# Patient Record
Sex: Female | Born: 1935 | Race: White | Hispanic: No | Smoking: Former smoker
Health system: Southern US, Community
[De-identification: ages and names within clinical notes are randomized; demographics above are authoritative.]

## PROBLEM LIST (undated history)

## (undated) DIAGNOSIS — I4891 Unspecified atrial fibrillation: Principal | ICD-10-CM

## (undated) DIAGNOSIS — R0602 Shortness of breath: Secondary | ICD-10-CM

## (undated) DIAGNOSIS — G629 Polyneuropathy, unspecified: Secondary | ICD-10-CM

## (undated) DIAGNOSIS — D72829 Elevated white blood cell count, unspecified: Secondary | ICD-10-CM

## (undated) DIAGNOSIS — J449 Chronic obstructive pulmonary disease, unspecified: Secondary | ICD-10-CM

## (undated) DIAGNOSIS — E785 Hyperlipidemia, unspecified: Secondary | ICD-10-CM

## (undated) DIAGNOSIS — D649 Anemia, unspecified: Secondary | ICD-10-CM

## (undated) DIAGNOSIS — I10 Essential (primary) hypertension: Secondary | ICD-10-CM

## (undated) DIAGNOSIS — K31819 Angiodysplasia of stomach and duodenum without bleeding: Secondary | ICD-10-CM

## (undated) DIAGNOSIS — K219 Gastro-esophageal reflux disease without esophagitis: Secondary | ICD-10-CM

## (undated) DIAGNOSIS — E11610 Type 2 diabetes mellitus with diabetic neuropathic arthropathy: Secondary | ICD-10-CM

## (undated) DIAGNOSIS — R591 Generalized enlarged lymph nodes: Secondary | ICD-10-CM

## (undated) DIAGNOSIS — R300 Dysuria: Secondary | ICD-10-CM

## (undated) DIAGNOSIS — I890 Lymphedema, not elsewhere classified: Secondary | ICD-10-CM

## (undated) DIAGNOSIS — C50919 Malignant neoplasm of unspecified site of unspecified female breast: Secondary | ICD-10-CM

## (undated) DIAGNOSIS — E119 Type 2 diabetes mellitus without complications: Secondary | ICD-10-CM

## (undated) DIAGNOSIS — R5383 Other fatigue: Secondary | ICD-10-CM

## (undated) DIAGNOSIS — E559 Vitamin D deficiency, unspecified: Secondary | ICD-10-CM

## (undated) HISTORY — PX: TONSILLECTOMY: SUR1361

## (undated) HISTORY — DX: Anemia, unspecified: D64.9

## (undated) HISTORY — PX: LUNG REMOVAL, PARTIAL: SHX233

## (undated) HISTORY — PX: THYROIDECTOMY, PARTIAL: SHX18

## (undated) HISTORY — DX: Chronic obstructive pulmonary disease, unspecified: J44.9

## (undated) HISTORY — DX: Elevated white blood cell count, unspecified: D72.829

## (undated) HISTORY — PX: BREAST SURGERY: SHX581

## (undated) HISTORY — PX: MASTECTOMY: SHX3

---

## 1998-01-22 ENCOUNTER — Other Ambulatory Visit: Admission: RE | Admit: 1998-01-22 | Discharge: 1998-01-22 | Payer: Self-pay | Admitting: Obstetrics and Gynecology

## 1998-04-30 ENCOUNTER — Encounter (HOSPITAL_COMMUNITY): Admission: RE | Admit: 1998-04-30 | Discharge: 1998-07-29 | Payer: Self-pay | Admitting: Cardiology

## 1998-05-22 ENCOUNTER — Encounter: Payer: Self-pay | Admitting: Cardiology

## 1998-05-22 ENCOUNTER — Inpatient Hospital Stay (HOSPITAL_COMMUNITY): Admission: EM | Admit: 1998-05-22 | Discharge: 1998-05-24 | Payer: Self-pay | Admitting: Internal Medicine

## 1998-07-25 ENCOUNTER — Ambulatory Visit (HOSPITAL_COMMUNITY): Admission: RE | Admit: 1998-07-25 | Discharge: 1998-07-25 | Payer: Self-pay | Admitting: Cardiology

## 1998-07-25 ENCOUNTER — Encounter: Payer: Self-pay | Admitting: Cardiology

## 1999-03-04 ENCOUNTER — Other Ambulatory Visit: Admission: RE | Admit: 1999-03-04 | Discharge: 1999-03-04 | Payer: Self-pay | Admitting: Obstetrics and Gynecology

## 2000-06-29 ENCOUNTER — Other Ambulatory Visit: Admission: RE | Admit: 2000-06-29 | Discharge: 2000-06-29 | Payer: Self-pay | Admitting: Obstetrics and Gynecology

## 2000-08-26 ENCOUNTER — Encounter: Admission: RE | Admit: 2000-08-26 | Discharge: 2000-08-26 | Payer: Self-pay | Admitting: Cardiology

## 2000-08-26 ENCOUNTER — Encounter: Payer: Self-pay | Admitting: Cardiology

## 2001-06-17 ENCOUNTER — Emergency Department (HOSPITAL_COMMUNITY): Admission: EM | Admit: 2001-06-17 | Discharge: 2001-06-17 | Payer: Self-pay | Admitting: Emergency Medicine

## 2001-06-27 ENCOUNTER — Encounter (INDEPENDENT_AMBULATORY_CARE_PROVIDER_SITE_OTHER): Payer: Self-pay | Admitting: Specialist

## 2001-06-27 ENCOUNTER — Ambulatory Visit (HOSPITAL_COMMUNITY): Admission: RE | Admit: 2001-06-27 | Discharge: 2001-06-27 | Payer: Self-pay | Admitting: *Deleted

## 2001-09-06 ENCOUNTER — Ambulatory Visit (HOSPITAL_COMMUNITY): Admission: RE | Admit: 2001-09-06 | Discharge: 2001-09-06 | Payer: Self-pay | Admitting: Cardiology

## 2001-09-06 ENCOUNTER — Encounter: Payer: Self-pay | Admitting: Cardiology

## 2002-06-18 ENCOUNTER — Encounter: Payer: Self-pay | Admitting: Emergency Medicine

## 2002-06-18 ENCOUNTER — Emergency Department (HOSPITAL_COMMUNITY): Admission: EM | Admit: 2002-06-18 | Discharge: 2002-06-18 | Payer: Self-pay | Admitting: Emergency Medicine

## 2002-10-18 ENCOUNTER — Other Ambulatory Visit: Admission: RE | Admit: 2002-10-18 | Discharge: 2002-10-18 | Payer: Self-pay | Admitting: Obstetrics and Gynecology

## 2002-12-21 ENCOUNTER — Ambulatory Visit (HOSPITAL_COMMUNITY): Admission: RE | Admit: 2002-12-21 | Discharge: 2002-12-21 | Payer: Self-pay | Admitting: *Deleted

## 2003-03-09 ENCOUNTER — Inpatient Hospital Stay (HOSPITAL_COMMUNITY): Admission: RE | Admit: 2003-03-09 | Discharge: 2003-03-12 | Payer: Self-pay | Admitting: *Deleted

## 2003-03-11 ENCOUNTER — Encounter: Payer: Self-pay | Admitting: *Deleted

## 2004-04-19 ENCOUNTER — Inpatient Hospital Stay (HOSPITAL_COMMUNITY): Admission: EM | Admit: 2004-04-19 | Discharge: 2004-04-22 | Payer: Self-pay | Admitting: *Deleted

## 2004-04-19 ENCOUNTER — Ambulatory Visit: Payer: Self-pay | Admitting: Internal Medicine

## 2004-04-21 ENCOUNTER — Encounter (INDEPENDENT_AMBULATORY_CARE_PROVIDER_SITE_OTHER): Payer: Self-pay | Admitting: Specialist

## 2004-05-13 ENCOUNTER — Encounter (HOSPITAL_COMMUNITY): Admission: RE | Admit: 2004-05-13 | Discharge: 2004-06-09 | Payer: Self-pay | Admitting: Cardiology

## 2004-11-06 ENCOUNTER — Other Ambulatory Visit: Admission: RE | Admit: 2004-11-06 | Discharge: 2004-11-06 | Payer: Self-pay | Admitting: Obstetrics and Gynecology

## 2004-11-24 ENCOUNTER — Ambulatory Visit (HOSPITAL_COMMUNITY): Admission: RE | Admit: 2004-11-24 | Discharge: 2004-11-24 | Payer: Self-pay | Admitting: Cardiology

## 2007-04-04 ENCOUNTER — Inpatient Hospital Stay (HOSPITAL_COMMUNITY): Admission: EM | Admit: 2007-04-04 | Discharge: 2007-04-07 | Payer: Self-pay | Admitting: Emergency Medicine

## 2007-05-30 ENCOUNTER — Inpatient Hospital Stay (HOSPITAL_COMMUNITY): Admission: EM | Admit: 2007-05-30 | Discharge: 2007-06-03 | Payer: Self-pay | Admitting: Emergency Medicine

## 2008-03-28 ENCOUNTER — Inpatient Hospital Stay (HOSPITAL_COMMUNITY): Admission: AD | Admit: 2008-03-28 | Discharge: 2008-04-06 | Payer: Self-pay | Admitting: Internal Medicine

## 2008-03-29 ENCOUNTER — Ambulatory Visit: Payer: Self-pay | Admitting: Surgery

## 2008-03-30 ENCOUNTER — Encounter (INDEPENDENT_AMBULATORY_CARE_PROVIDER_SITE_OTHER): Payer: Self-pay | Admitting: Internal Medicine

## 2009-08-16 ENCOUNTER — Inpatient Hospital Stay (HOSPITAL_COMMUNITY): Admission: EM | Admit: 2009-08-16 | Discharge: 2009-08-22 | Payer: Self-pay | Admitting: Emergency Medicine

## 2010-08-24 LAB — CK TOTAL AND CKMB (NOT AT ARMC)
CK, MB: 2.3 ng/mL (ref 0.3–4.0)
Relative Index: INVALID (ref 0.0–2.5)
Total CK: 43 U/L (ref 7–177)

## 2010-08-24 LAB — GLUCOSE, CAPILLARY
Glucose-Capillary: 100 mg/dL — ABNORMAL HIGH (ref 70–99)
Glucose-Capillary: 102 mg/dL — ABNORMAL HIGH (ref 70–99)
Glucose-Capillary: 107 mg/dL — ABNORMAL HIGH (ref 70–99)
Glucose-Capillary: 109 mg/dL — ABNORMAL HIGH (ref 70–99)
Glucose-Capillary: 118 mg/dL — ABNORMAL HIGH (ref 70–99)
Glucose-Capillary: 130 mg/dL — ABNORMAL HIGH (ref 70–99)
Glucose-Capillary: 150 mg/dL — ABNORMAL HIGH (ref 70–99)
Glucose-Capillary: 153 mg/dL — ABNORMAL HIGH (ref 70–99)
Glucose-Capillary: 166 mg/dL — ABNORMAL HIGH (ref 70–99)
Glucose-Capillary: 180 mg/dL — ABNORMAL HIGH (ref 70–99)
Glucose-Capillary: 188 mg/dL — ABNORMAL HIGH (ref 70–99)
Glucose-Capillary: 193 mg/dL — ABNORMAL HIGH (ref 70–99)
Glucose-Capillary: 211 mg/dL — ABNORMAL HIGH (ref 70–99)
Glucose-Capillary: 218 mg/dL — ABNORMAL HIGH (ref 70–99)
Glucose-Capillary: 223 mg/dL — ABNORMAL HIGH (ref 70–99)
Glucose-Capillary: 246 mg/dL — ABNORMAL HIGH (ref 70–99)
Glucose-Capillary: 261 mg/dL — ABNORMAL HIGH (ref 70–99)
Glucose-Capillary: 263 mg/dL — ABNORMAL HIGH (ref 70–99)
Glucose-Capillary: 317 mg/dL — ABNORMAL HIGH (ref 70–99)
Glucose-Capillary: 370 mg/dL — ABNORMAL HIGH (ref 70–99)
Glucose-Capillary: 398 mg/dL — ABNORMAL HIGH (ref 70–99)
Glucose-Capillary: 43 mg/dL — CL (ref 70–99)
Glucose-Capillary: 457 mg/dL — ABNORMAL HIGH (ref 70–99)
Glucose-Capillary: 96 mg/dL (ref 70–99)

## 2010-08-24 LAB — DIFFERENTIAL
Basophils Absolute: 0 K/uL (ref 0.0–0.1)
Basophils Relative: 0 % (ref 0–1)
Eosinophils Absolute: 0 K/uL (ref 0.0–0.7)
Eosinophils Relative: 0 % (ref 0–5)
Lymphocytes Relative: 12 % (ref 12–46)
Lymphs Abs: 1.5 K/uL (ref 0.7–4.0)
Monocytes Absolute: 0.5 K/uL (ref 0.1–1.0)
Monocytes Relative: 4 % (ref 3–12)
Neutro Abs: 10.3 K/uL — ABNORMAL HIGH (ref 1.7–7.7)
Neutrophils Relative %: 84 % — ABNORMAL HIGH (ref 43–77)

## 2010-08-24 LAB — BASIC METABOLIC PANEL
BUN: 38 mg/dL — ABNORMAL HIGH (ref 6–23)
BUN: 49 mg/dL — ABNORMAL HIGH (ref 6–23)
CO2: 23 mEq/L (ref 19–32)
CO2: 24 mEq/L (ref 19–32)
Calcium: 8.6 mg/dL (ref 8.4–10.5)
Calcium: 8.8 mg/dL (ref 8.4–10.5)
Chloride: 110 mEq/L (ref 96–112)
Creatinine, Ser: 1.27 mg/dL — ABNORMAL HIGH (ref 0.4–1.2)
Creatinine, Ser: 1.44 mg/dL — ABNORMAL HIGH (ref 0.4–1.2)
GFR calc Af Amer: 48 mL/min — ABNORMAL LOW (ref >60)
GFR calc Af Amer: 50 mL/min — ABNORMAL LOW (ref >60)
GFR calc non Af Amer: 34 mL/min — ABNORMAL LOW (ref >60)
GFR calc non Af Amer: 40 mL/min — ABNORMAL LOW (ref >60)
GFR calc non Af Amer: 42 mL/min — ABNORMAL LOW (ref >60)
Glucose, Bld: 114 mg/dL — ABNORMAL HIGH (ref 70–99)
Potassium: 4.2 mEq/L (ref 3.5–5.1)
Sodium: 136 mEq/L (ref 135–145)
Sodium: 142 mEq/L (ref 135–145)

## 2010-08-24 LAB — URINALYSIS, ROUTINE W REFLEX MICROSCOPIC
Bilirubin Urine: NEGATIVE
Ketones, ur: NEGATIVE mg/dL
Nitrite: NEGATIVE
Specific Gravity, Urine: 1.021 (ref 1.005–1.030)
Urobilinogen, UA: 0.2 mg/dL (ref 0.0–1.0)

## 2010-08-24 LAB — CBC
HCT: 32.2 % — ABNORMAL LOW (ref 36.0–46.0)
HCT: 35 % — ABNORMAL LOW (ref 36.0–46.0)
Hemoglobin: 11 g/dL — ABNORMAL LOW (ref 12.0–15.0)
Hemoglobin: 11.1 g/dL — ABNORMAL LOW (ref 12.0–15.0)
Hemoglobin: 11.4 g/dL — ABNORMAL LOW (ref 12.0–15.0)
Hemoglobin: 12 g/dL (ref 12.0–15.0)
MCHC: 34.1 g/dL (ref 30.0–36.0)
MCHC: 34.1 g/dL (ref 30.0–36.0)
MCHC: 34.2 g/dL (ref 30.0–36.0)
MCHC: 34.3 g/dL (ref 30.0–36.0)
MCV: 89.6 fL (ref 78.0–100.0)
MCV: 90 fL (ref 78.0–100.0)
MCV: 90.3 fL (ref 78.0–100.0)
MCV: 90.5 fL (ref 78.0–100.0)
Platelets: 235 10*3/uL (ref 150–400)
Platelets: 254 10*3/uL (ref 150–400)
Platelets: 254 K/uL (ref 150–400)
Platelets: 259 10*3/uL (ref 150–400)
RBC: 3.57 MIL/uL — ABNORMAL LOW (ref 3.87–5.11)
RBC: 3.63 MIL/uL — ABNORMAL LOW (ref 3.87–5.11)
RBC: 3.71 MIL/uL — ABNORMAL LOW (ref 3.87–5.11)
RBC: 3.87 MIL/uL (ref 3.87–5.11)
RDW: 13.2 % (ref 11.5–15.5)
RDW: 13.2 % (ref 11.5–15.5)
RDW: 13.5 % (ref 11.5–15.5)
WBC: 11.6 10*3/uL — ABNORMAL HIGH (ref 4.0–10.5)
WBC: 12.3 10*3/uL — ABNORMAL HIGH (ref 4.0–10.5)
WBC: 20.5 10*3/uL — ABNORMAL HIGH (ref 4.0–10.5)
WBC: 26.8 K/uL — ABNORMAL HIGH (ref 4.0–10.5)

## 2010-08-24 LAB — BASIC METABOLIC PANEL WITH GFR
BUN: 17 mg/dL (ref 6–23)
BUN: 40 mg/dL — ABNORMAL HIGH (ref 6–23)
BUN: 46 mg/dL — ABNORMAL HIGH (ref 6–23)
CO2: 23 meq/L (ref 19–32)
CO2: 24 meq/L (ref 19–32)
Calcium: 8.9 mg/dL (ref 8.4–10.5)
Calcium: 9.2 mg/dL (ref 8.4–10.5)
Chloride: 107 meq/L (ref 96–112)
Chloride: 108 meq/L (ref 96–112)
Chloride: 110 meq/L (ref 96–112)
Creatinine, Ser: 1.22 mg/dL — ABNORMAL HIGH (ref 0.4–1.2)
Creatinine, Ser: 1.27 mg/dL — ABNORMAL HIGH (ref 0.4–1.2)
Creatinine, Ser: 1.38 mg/dL — ABNORMAL HIGH (ref 0.4–1.2)
GFR calc non Af Amer: 36 mL/min — ABNORMAL LOW
GFR calc non Af Amer: 40 mL/min — ABNORMAL LOW
Glucose, Bld: 111 mg/dL — ABNORMAL HIGH (ref 70–99)
Glucose, Bld: 319 mg/dL — ABNORMAL HIGH (ref 70–99)
Glucose, Bld: 338 mg/dL — ABNORMAL HIGH (ref 70–99)
Potassium: 4.8 meq/L (ref 3.5–5.1)
Potassium: 4.8 meq/L (ref 3.5–5.1)
Sodium: 137 meq/L (ref 135–145)
Sodium: 141 meq/L (ref 135–145)

## 2010-08-24 LAB — CARDIAC PANEL(CRET KIN+CKTOT+MB+TROPI)
CK, MB: 2.9 ng/mL (ref 0.3–4.0)
CK, MB: 3.1 ng/mL (ref 0.3–4.0)
Relative Index: INVALID (ref 0.0–2.5)
Relative Index: INVALID (ref 0.0–2.5)
Total CK: 48 U/L (ref 7–177)
Total CK: 54 U/L (ref 7–177)

## 2010-08-24 LAB — TSH: TSH: 0.235 u[IU]/mL — ABNORMAL LOW (ref 0.350–4.500)

## 2010-08-24 LAB — POCT CARDIAC MARKERS
CKMB, poc: 1.5 ng/mL (ref 1.0–8.0)
Myoglobin, poc: 117 ng/mL (ref 12–200)
Troponin i, poc: <0.05 ng/mL (ref 0.00–0.09)

## 2010-08-24 LAB — URINE MICROSCOPIC-ADD ON

## 2010-08-24 LAB — TROPONIN I: Troponin I: 0.01 ng/mL (ref 0.00–0.06)

## 2010-08-24 LAB — HEMOGLOBIN A1C
Hgb A1c MFr Bld: 8.3 % — ABNORMAL HIGH (ref 4.6–6.1)
Mean Plasma Glucose: 192 mg/dL

## 2010-08-24 LAB — BRAIN NATRIURETIC PEPTIDE: Pro B Natriuretic peptide (BNP): 74 pg/mL (ref 0.0–100.0)

## 2010-10-14 NOTE — H&P (Signed)
NAME:  Lori [name], Lori [name] NO.:  [id number]   MEDICAL RECORD NO.:  [id number]          PATIENT TYPE:  INP   LOCATION:  5509                         FACILITY:  [facility]   PHYSICIAN:  Lucita Ferrara, MD         DATE OF BIRTH:  Jul 04, 1920   DATE OF ADMISSION:  04/04/2007  DATE OF DISCHARGE:                              HISTORY & PHYSICAL   HISTORY OF PRESENT ILLNESS:  The patient is an 78 year old female comes  in with black tarry stools that has been going on for several days, 4 or  5 days, getting progressively worse.  The patient denies any massive  bright red blood per rectum.  Denies any vomiting, hematemesis.  Denies  hemorrhoids.  Denies diarrhea or focal abdominal pain, Denies nausea or  vomiting.  She also denies any chest pain, shortness of breath.  Positive dizziness.  No falls.  Denies excessive use of NSAIDS.   REVIEW OF SYSTEMS:  Otherwise, review of systems is negative.  No focal  neurological deficits.   PAST MEDICAL HISTORY:  Significant for:  1. Recurrent GI bleed status post EGD April 21, 2004, status post      epinephrine injection and biopsy.  2. Type 2 diabetes.  3. Dyslipidemia.  4. Hypertension.  5. History of paroxysmal atrial fibrillation.  6. History of hypothyroidism, history of being on Synthroid.   ALLERGIES:  1. PENICILLIN.  2. SULFA.   MEDICATIONS:  1. Benicar.  2. Glyburide.  Does not know doses.   PAST SURGICAL HISTORY:  None.   PHYSICAL EXAMINATION:  VITAL SIGNS:  T-max 97.8.  Blood pressure 105/54.  Pulse 97 to 117.  Pulse ox 100% on room air.  GENERAL:  The patient is in no acute distress.  HEENT:  Normocephalic, atraumatic.  Sclerae anicteric.  No JVD.  No  carotid bruits.  NECK:  Supple.  CARDIOVASCULAR:  S1, S2.  Regular rate and rhythm.  No murmurs, rubs or  clicks.  ABDOMEN:  Soft, nontender, nondistended.  RECTAL:  Good tone.  Stool for guaiac positive.  Stool was black.  LUNGS:  Clear to auscultation bilaterally.   No rhonchi, rales or  wheezes.  EXTREMITIES:  No clubbing, cyanosis or edema.  NEURO:  Grossly intact.  Cranial nerves II-XII grossly intact.   LABORATORY DATA:  A complete metabolic panel:  Sodium 136.  Potassium  5.5.  Chloride 104.  CO2 24.  BUN 96.  Creatinine 1.34.  Glucose 416.  CBC:  White count 13.3.  Hemoglobin 7.1.  Hematocrit 20.5.  Platelets  230.  MCV 86.7.  No radiological studies were done here in ED.   ASSESSMENT/PLAN:  An 77 year old with:  1. Drop in hemoglobin, most likely secondary to upper GI bleed.  We      consulted Gastroenterology for a possible upper endoscopy versus      total endoscopy.  Dr. Laural Benes, from [facility], consulted.  We will      go ahead and transfuse 2 units of packed red blood cells and      recheck an H/H every 508  hours, IV fluids, strict I's and O's.  2. Diabetes type 2, uncontrolled.  We will hold oral medications for      now.  Sliding scale insulin with Lantus at night.  Hemoglobin A1c      to check control.  3. Leukocytosis.  Get a chest x-ray PA and lateral.  Pan culture, UA      urine culture, sputum culture and blood culture.  Empiric      antibiotics with Levaquin 500 mg IV daily.  4. Hypertension.  Continue Benicar, other medications.  Consider beta      blockers.  5. History of paroxysmal AFib with current tachycardia.  Normal sinus      rhythm.  It is sinus tach.  We will go ahead and start metoprolol      12.5 mg p.o. b.i.d.  We will get an EKG and cardiac enzymes x3.  We      will also get a thyroid function test given history of      hypothyroidism.  I have explained the plans of procedures of this      admission to the patient.  The patient understands well.      Lucita Ferrara, MD  Electronically Signed     RR/MEDQ  D:  04/04/2007  T:  04/04/2007  Job:  [id number]

## 2010-10-14 NOTE — Discharge Summary (Signed)
Lori [name], Lori [name]                 ACCOUNT NO.:  [id number]   MEDICAL RECORD NO.:  [id number]          PATIENT TYPE:  INP   LOCATION:  4740                         FACILITY:  [facility]   PHYSICIAN:  Wilson Singer, M.D.DATE OF BIRTH:  20-Nov-1920   DATE OF ADMISSION:  05/30/2007  DATE OF DISCHARGE:  06/03/2007                               DISCHARGE SUMMARY   FINAL DISCHARGE DIAGNOSES:  1. Gastrointestinal bleed, unclear etiology.  2. Diabetes mellitus type 2.  3. History of right lung cancer status post surgical resection 1995.      No further recurrence.  4. Status post multiple endoscopy.  5. Hypertension.   CONDITION ON DISCHARGE:  Stable.   MEDICATIONS ON DISCHARGE:  Glyburide 10 mg b.i.d. and Benicar 20 mg  daily.   HISTORY:  This very pleasant 77 year old lady was admitted with  dizziness, weakness and melena for 2 weeks.  Please see initial history  and physical examination by Dr. Brien Few.   HOSPITAL COURSE:  The patient was admitted and on admission her  hemoglobin was 7.2.  She had to have blood transfusion of 2 units.  She  then further had another 1 unit of transfusion to make total of 3 units  total.  She was seen by her gastroenterologist, Dr. Virginia Rochester, who did an  upper GI endoscopy which showed no active bleeding at the time but the  presumption was that she had AV malformations causing the bleeding which  had now discontinued.  He has ordered a video capsule endoscopy but she  has failed to pass the capsule at the present time.  She also underwent  a CT scan of the abdomen which was unremarkable except for possible  abnormality of the left renal gland.  This should be further evaluated  as per recommendation by radiology as an outpatient by MR scanning.  It  was not felt appropriate to do this as an inpatient as she was now  clinically stable.   PHYSICAL EXAMINATION:  On physical examination today there is no further  GI bleeding.  On examination, temperature 97.8,  blood pressure 122/69,  pulse 89, saturation 99% on room air.  There were no new physical  findings today.   INVESTIGATIONS:  Today show a sodium of 136, potassium 4.1, bicarbonate  25, BUN 19, creatinine 1.12, hemoglobin 11.6, white blood cell count  9.6, platelets 339.   FOLLOWUP AND DISPOSITION:  She is stable to be discharged home and will  follow up with Dr. Virginia Rochester within the next week or so.  During this  hospitalization her antihypertensive medication was discontinued and her  blood pressure maintained at a reasonable level.  She may start her ACE  inhibitor as an outpatient but her blood pressure will need to be  closely monitored to make sure it does not become hypotensive.      Wilson Singer, M.D.  Electronically Signed     NCG/MEDQ  D:  06/03/2007  T:  06/03/2007  Job:  [id number]   cc:   Jaclyn Prime. Lucas Mallow, M.D.  Georgiana Spinner, M.D.

## 2010-10-14 NOTE — Discharge Summary (Signed)
NAMECHANDY, Lori [name]                 ACCOUNT NO.:  [id number]   MEDICAL RECORD NO.:  [id number]          PATIENT TYPE:  INP   LOCATION:  5501                         FACILITY:  [facility]   PHYSICIAN:  Ladell Pier, M.D.   DATE OF BIRTH:  05/10/1921   DATE OF ADMISSION:  04/04/2007  DATE OF DISCHARGE:  04/07/2007                               DISCHARGE SUMMARY   DISCHARGE DIAGNOSIS:  1. Recurrent gastrointestinal bleed status post      esophagogastroduodenoscopy April 21, 2004 status post      epinephrine injection and biopsy then.  2. Diabetes.  3. Dyslipidemia.  4. Hypertension.  5. History of paroxysmal atrial fibrillation.  6. History of hypothyroidism doing well on Synthroid.  7. Mild leukocytosis.   DISCHARGE MEDICATIONS:  1. Benicar.  2. Glyburide.  3. Ambien every evening p.r.n.   PROCEDURES:  She had upper and lower endoscopy done per Dr. Kinnie Scales on  April 06, 2007.  Upper endoscopy showed distal esophagus ulcer without  active hemorrhage, erosive duodenitis at the bulb, but no hemorrhage.  Colonoscopy showed no lesions to explain bleed, diverticulosis, AVMs  small nonbleeding, no neoplasm.   CONSULTANTS:  Griffith Citron, M.D.   FOLLOW-UP APPOINTMENTS:  The patient is to call Dr. Jennye Boroughs office and  make scheduled follow-up appointment.  Question of scheduled for a  capsule endoscopy.  The patient also to follow up with primary care  doctor, Dr. Lucas Mallow.   INSTRUCTIONS:  The patient was instructed not to use any aspirin  products or any NSAID.  The patient is to have followup chest x-ray to  followup on x-ray done in the hospital as described below.   HISTORY OF PRESENT ILLNESS:  The patient is an 77 year old female that  comes in with black tarry stool that has been going on for several days  that has been getting progressively worse.  She did not have any bright  red blood per rectum.  No hematemesis.  No hemorrhoids.  No diarrhea or  abdominal pain.  She  has no shortness of breath.  No dizziness.  No  excessive NSAID use.  Past medical history, family history, social  history, meds, allergies, review of systems per admission H&P.   PHYSICAL EXAMINATION ON DISCHARGE:  VITAL SIGNS:  Temperature 98, pulse  of 88, respirations 20, blood pressure 122/69, pulse ox 99% on room air.  CBG 87-131.  HEENT:  Head is normocephalic, atraumatic.  Pupils reactive to light.  Throat without erythema.  CARDIOVASCULAR:  Irregular.  LUNGS:  Clear bilaterally.  ABDOMEN:  Positive bowel sounds, nontender, nondistended.  EXTREMITIES:  Without edema.   HOSPITAL COURSE:  1. GI bleed:  The patient was admitted to the hospital.  She was      transfused several units of blood.  GI was consulted.  She had both      an upper and a lower endoscopy done that did not show any active      bleeding.  She will follow up with Dr. Kinnie Scales and with Dr. Virginia Rochester      outpatient.  On discharge, hemoglobin  was greater than 11 and      stable.  She was instructed not to take any aspirin products or use      any NSAIDs.  2. Diabetes:  Her blood sugar remained stable throughout her      hospitalization.  Last blood sugar prior to discharge was 87.  3. Hypothyroidism:  She is not on medications for thyroid disorder.      She was previously, but her TSH is normal at 1.894.  4. Anemia, blood loss anemia:  Hemoglobin was stable on discharge.  5. Mild leukocytosis:  White count was slightly elevated.  This could      be a stress reaction.  Patient to followup for CBC outpatient.   DISCHARGE LABORATORY DATA:  TSH 1.894.  Urine culture was negative.  Sodium 140, potassium 3.8, chloride 109, CO2 25, glucose 81, BUN 24,  creatinine 0.89, calcium 8.4.  WBC 11.8, hemoglobin 11.4, platelet 194.  Blood cultures negative x2.  Cardiac enzymes normal.  Hemoglobin A1c  6.5.  Chest x-ray showed focal density at the apex probably related to  scarring although a small area of infection or inflammatory  alveolitis  could have this appearance.  Follow up to ensure stability/resolution is  recommended.      Ladell Pier, M.D.  Electronically Signed     NJ/MEDQ  D:  04/07/2007  T:  04/07/2007  Job:  [id number]   cc:   Griffith Citron, M.D.  Dr. Lucas Mallow

## 2010-10-14 NOTE — H&P (Signed)
Lori [name], BILLS NO.:  [id number]   MEDICAL RECORD NO.:  [id number]          PATIENT TYPE:  INP   LOCATION:  2918                         FACILITY:  [facility]   PHYSICIAN:  Isidor Holts, M.D.  DATE OF BIRTH:  02/02/1921   DATE OF ADMISSION:  05/30/2007  DATE OF DISCHARGE:                              HISTORY & PHYSICAL   PRIMARY MEDICAL DOCTOR:  Dr. Lucas Mallow.   PRIMARY GASTROENTEROLOGIST:  Dr. Sabino Gasser.   CHIEF COMPLAINT:  Dizziness and weakness, worse today.  Also melena for  the past 2 weeks.   HISTORY OF PRESENT ILLNESS:  This is an 77 year old female. For past  medical history, see below.  The patient has known history of recurrent  GI bleed and anemia and is status post multiple unrevealing endoscopies  and multiple blood transfusions.  Her last admission was to [facility] from November 3 to April 07, 2007, for similar symptoms.  She  was transfused appropriately at that time, and underwent upper and lower  GI endoscopy.  Capsule endoscopy was attempted.  However, this was not  completed for technical reasons.  Hemoglobin on discharge was 11.4.  According to the patient, she felt quite well thereafter.  However,  since approximately 2 weeks ago, she started having recurrent episodes  of black stools and has experienced progressive weakness.  She is no  more short of breath than usual.  Denies abdominal pain.  Denies  hematemesis, and had experienced no ankle swelling or chest pain.  She  also emphatically denies NSAIDS use.  Today on May 30, 2007, while  she was shopping at [employer], at about 1:00 p.m., she felt so weak  and dizzy that she had to sit on the floor.  EMS was called by  bystanders, but the patient declined ED visit at that time.  Her son,  however, took her to her primary MD's office, who then referred her to  the emergency department.   PAST MEDICAL HISTORY:  1. Recurrent GI bleed, query source.  2. Status post  multiple endoscopies.  3. Type 2 diabetes mellitus/neuropathy.  4. Hypertension.  5. Dyslipidemia.  6. Paroxysmal atrial fibrillation.  7. Dysthyroidism.  8. Status post thyroidectomy.  9. History of right lung cancer status post surgical resection 1995.      Did not have chemotherapy or radiation therapy.   MEDICATION HISTORY.:  1. Benicar 20 mg p.o. daily.  2. Glyburide 10 mg p.o. b.i.d.  3. Prednisone 5 mg p.o. every other day.   ALLERGIES:  PENICILLIN AND SULFA.   REVIEW OF SYSTEMS:  As per HPI and chief complaint otherwise negative.   SOCIAL HISTORY:  The patient is widowed, has two adult female children,  remote history of smoking, quit 1995.  No alcohol use.  She was employed  as an Film/video editor prior to retirement.   FAMILY HISTORY:  Noncontributory.   PHYSICAL EXAMINATION:  VITALS:  Temperature 97.0, pulse 94 per minute  regular, respiratory 24, BP 124/77 mmHg, pulse oximeter 99% on room air.  The patient does not appear to be  in obvious acute distress at time of  this evaluation.  However, she appears mildly short of breath on minimal  exertion.  HEENT:.  Marked clinical pallor, no jaundice.  No conjunctival  injection.  Throat is clear.  NECK:  Supple.  JVP not seen.  No palpable lymphadenopathy.  No palpable  goiter.  CHEST:  Clinically clear to auscultation.  No wheezes or crackles.  HEART:  Sounds 1 and 2 heard, normal, regular, no murmurs.  ABDOMEN:  Full, soft and nontender.  No palpable organomegaly or  palpable masses.  Normal bowel sounds.  LOWER EXTREMITY EXAMINATION:  No pitting edema.  Palpable peripheral  pulses.  MUSCULOSKELETAL SYSTEM:  Osteoarthritic changes.  CENTRAL NERVOUS SYSTEM:  No focal neurologic deficit on gross  examination.   INVESTIGATIONS:  CBC:  WBC 12.5, hemoglobin 7.2, hematocrit 22.3.  MCV  89.9, platelets 366, INR 0.9, PTT 30 seconds.  Electrolytes:  Sodium  132, potassium 4.7, chloride 102, CO2 22, BUN 43, creatinine 1.26,   glucose 270, AST 30.   ASSESSMENT AND PLAN:  1. GI bleed.  Clinically, this appears to be upper GI bleed.  However,      the patient has had multiple previous endoscopies for similar      symptoms with no culprit source found.  The patient is symptomatic      however, we shall admit to the step-down unit for close monitoring,      institute intravenous fluids, do serial hemoglobin/hematocrit and      consult gastroenterology. As capsule endoscopy was not feasible      during the last admission secondary to technical reasons, perhaps      it can be done during the course of this hospitalization.   1. Acute on chronic blood loss anemia.  We shall do hematinic studies      and transfuse packed red blood cells p.r.n.   1. Type 2 diabetes mellitus.  The patient to be n.p.o. for now.      However, we shall institute sliding scale insulin coverage.   1. History of hypertension.  The patient is normotensive at present,      and in view of orthostatic symptoms, we shall hold antihypertensive      medications and monitor.   1. Presyncope secondary to #s 1 and 2 above.  This should resolve      following appropriate management as outlined above.   1. Possible restless leg syndrome.  The patient states that she has a      diabetic neuropathy.  However, on questioning, it appears that she      experiences pains in both lower extremities when she lies down to      sleep but      these disappear when she gets up and starts walking around.  These      symptoms appeared to be consistent with restless leg syndrome.      Perhaps the patient will benefit from a trial of Requip when      clinically stable.   Further management will depend on clinical course.      Isidor Holts, M.D.  Electronically Signed     CO/MEDQ  D:  05/30/2007  T:  05/31/2007  Job:  [id number]   cc:   Jaclyn Prime. Lucas Mallow, M.D.  Georgiana Spinner, M.D.

## 2010-10-14 NOTE — Consult Note (Signed)
NAMEALTAMESE, Lori [name] NO.:  [id number]   MEDICAL RECORD NO.:  [id number]          PATIENT TYPE:  INP   LOCATION:  5529                         FACILITY:  [facility]   PHYSICIAN:  Leonides Grills, M.D.     DATE OF BIRTH:  11/28/1920   DATE OF CONSULTATION:  04/04/2008  DATE OF DISCHARGE:                                 CONSULTATION   CHIEF COMPLAINT:  Right foot redness with deformity over the last  several weeks.   HISTORY:  This 77 year old female for over the last several weeks has  developed progressive redness and deformity to her right foot.  She was  admitted to medicine for cellulitis and was seen by Dr. Ranell Patrick who  checked her films and MRI and found that she had a Charcot arthropathy  with overlying cellulitis and had an impending ulcer.  She then referred  to me for further evaluation and treatment.   PAST MEDICAL HISTORY:  She has diabetes mellitus with peripheral  neuropathy, hypertension, AFib, and anemia.  She is on 8 days of  vancomycin.  She does not smoke.   PHYSICAL EXAMINATION:  She has erythema over the entire dorsal aspect of  her right foot and ankle.  She has wrinkles in her skin.  She has a  valgus, slight extension deformity to her right foot and she has an  impending ulcer over the medial cuneiform on the medial aspect of the  right foot.  She has exquisitely tight gastroc.  She has had decreased  sensation of her entire foot and ankle, palpable pulses.  The foot is  warm.  X-rays and MRI were evaluated show a homolateral and dorsolateral  dislocation through the TMT joint consistent with Charcot arthropathy.  No abscess.   IMPRESSION:  Cellulitis right foot with underlying Charcot arthropathy  with homolateral valgus extension dislocation and an impending ulcer.  Plan is explained to Ms. Gebhard at great length today that due to the  fact that she has an overlying cellulitis that an extensive  reconstruction of her forefoot would likely lead  to not only wound  problems but a deep infection and likely an amputation.  Because of this  in the face of her having impending ulcer, I would recommend that we do  an excision of the medial and probable the middle cuneiforms and the  gastroc slide.  We aware of the risks of the surgery, which include  infection or vessel injury, progression of the Charcot arthropathy and  instability, possibility of further impending ulcer, deep infection,  wound complications, and possibly even a proximalamputations, included  bony amputation, were  all explained.  Questions were encouraged and  answered.  I discussed this with the patient as well as her son.      Leonides Grills, M.D.  Electronically Signed     PB/MEDQ  D:  04/04/2008  T:  04/05/2008  Job:  [id number]

## 2010-10-14 NOTE — H&P (Signed)
NAMEJAYME, MEDNICK NO.:  [id number]   MEDICAL RECORD NO.:  [id number]          PATIENT TYPE:  INP   LOCATION:  5529                         FACILITY:  [facility]   PHYSICIAN:  Massie Maroon, MD        DATE OF BIRTH:  1920-12-16   DATE OF ADMISSION:  03/28/2008  DATE OF DISCHARGE:                              HISTORY & PHYSICAL   CHIEF COMPLAINT:  My leg is red.   HISTORY OF PRESENT ILLNESS:  An 77 year old female with complaints of  redness of her right distal lower extremity for 2-3 weeks.  She was  treated with Avelox initially and received 5 days of Avelox and then was  seen by Dr. Arminda Resides on Monday.  He switched the antibiotic to  doxycycline to treat her for cellulitis and there was no improvement, so  today she was sent over for admission for IV antibiotics.  The patient  denies any fever, chills, or pain in her feet unless she is walking.   PAST MEDICAL HISTORY:  1. Diabetes with neuropathy.  2. Hypertension.  3. Paroxysmal atrial fibrillation (not on Coumadin due to history of      GI bleeding, recurrent).  4. GERD.  5. 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  6. Hypothyroidism.  7. Left adrenal hyperplasia on CT scan August 26, 2000.   PAST SURGICAL HISTORY:  1. Lung cancer status post resection right lower lung, 1995.  2. Partial thyroidectomy, 1982.  3. T&A.  4. Colonoscopy, June 27, 2001, which showed internal hemorrhoids.   SOCIAL HISTORY:  The patient does not smoke or drink.  She quit smoking  16 years ago.   FAMILY HISTORY:  Her father had a AAA and died at age 64.  Her mother  had melanoma and died at age 27.   ALLERGIES:  1. PENICILLIN.  2. SULFA.  3. ASPIRIN.  4. GLUCOPHAGE.  5. COUMADIN.  6. LIPITOR.   MEDICATIONS:  1. Benicar 40 mg, one-half daily.  2.  Glyburide 5 mg two b.i.d.  3. Bisoprolol 5 mg one-half daily.   REVIEW OF SYSTEMS:  Negative for all 10-organ systems except for  pertinent positives stated above.   PHYSICAL EXAMINATION:  VITAL SIGNS:  Temperature 98.1, pulse 94, blood  pressure 133/79, respirations 18, pulse ox 100% on room air.  HEENT: Anicteric, EOMI, no nystagmus, pupils 1.5 mm, symmetric, direct,  consensual, near reflexes intact, mucous membranes moist.  NECK:  No JVD, no bruit, no thyromegaly, no adenopathy.  HEART:  Irregular, S1-S2, no murmurs, gallops, or rubs.  LUNGS:  Clear to auscultation bilaterally.  ABDOMEN:  Soft, nontender, nondistended.  Positive bowel sounds.  EXTREMITIES:  No cyanosis or clubbing, positive edema of the right foot,  positive redness over the dorsum of the right foot extending 1/3rd up to  the knee, negative Homans sign, negative cord.  NEUROLOGIC:  Nonfocal, cranial nerves II through XII intact, reflexes  2+, symmetric, diffuse with downgoing toes bilaterally, motor strength  5/5 in all 4 extremities, pinprick intact.   ASSESSMENT:  1. Cellulitis of the right foot, failed outpatient antibiotics.  2. Edema.  3. Diabetes.  4. Hypertension.  5. Paroxysmal atrial fibrillation (not on Coumadin due to history of      recurrent gastrointestinal bleeding).  6. Gastrointestinal bleed, gastroesophageal reflux disease.   PLAN:  We will check an x-ray of the foot and an ultrasound to rule out  DVT because of her leg swelling.  We will treat her cellulitis with  Avelox 400 mg IV daily and vancomycin, pharmacy to dose.  We will check  a sed rate and CRP, in case there is any hint of osteomyelitis on x-ray.  We would check fingerstick blood sugars b.i.d. and cover with insulin  sliding scale and continue glyburide 5 mg two b.i.d.  For DVT  prophylaxis, we will use early ambulation as well as SCD plus TEDS on  the left lower extremity.  We will avoid Lovenox due to her history of  recurrent  GI bleeding in the past.      Massie Maroon, MD  Electronically Signed     JYK/MEDQ  D:  03/28/2008  T:  03/29/2008  Job:  [id number]   cc:   [place]. Yetta Barre, M.D.

## 2010-10-14 NOTE — Op Note (Signed)
Lori [name], Lori [name] NO.:  [id number]   MEDICAL RECORD NO.:  [id number]          PATIENT TYPE:  INP   LOCATION:  2918                         FACILITY:  [facility]   PHYSICIAN:  Georgiana Spinner, M.D.    DATE OF BIRTH:  1920/11/14   DATE OF PROCEDURE:  DATE OF DISCHARGE:                               OPERATIVE REPORT   PROCEDURE:  Upper endoscopy.   INDICATIONS:  Gastrointestinal bleeding.   ANESTHESIA:  Fentanyl 50 mcg, Versed 5 mg.   DESCRIPTION OF PROCEDURE:  With patient mildly sedated in the left  lateral decubitus position, the Pentax videoscopic endoscope was  inserted in the mouth, passed under direct vision into the esophagus  which appeared normal into the stomach.  There were flecks of blood seen  in the stomach and question of possibly an AVM or 2 was noted.  We  advanced to the duodenal bulb and then subsequently to the second  portion of the duodenum as we passed through this area where a small  amount of blood was seen.  It appeared to be fairly fresh and I could  not see where it was emanating from, so I elected to withdrawal this  endoscope to the stomach, place it in retroflexion to view the stomach  from below and then withdraw it taking several  views of the remaining  gastric and esophageal mucosa.  At that time inserted a Pentax side-  viewing duodenoscope which was passed into the esophagus, stomach and  duodenum.  We found the area where the blood was seen and I was able to  actually pull it off with a biopsy forceps, but there was no underlying  lesion and we then looked further.  I did not see blood emanating from  ampulla, although it was near the site of the blood pool.  We pulled  back into the duodenal bulb, saw 2 small AVMs there neither of which  were bleeding.  The endoscope was then withdrawn.  Patient's vital signs  and pulse oximetry remained stable.  Patient tolerated the procedure  well without apparent complications.   FINDINGS:  Upper gastrointestinal bleeding presumed from AVMs, but no  active bleeding seen at this time nor lesion noted.   PLAN:  Plan will be to get capsule endoscopy to see if there are other  AVMs distally which would change the way we would approach therapy for  this lady.           ______________________________  Georgiana Spinner, M.D.     GMO/MEDQ  D:  05/31/2007  T:  05/31/2007  Job:  [id number]

## 2010-10-14 NOTE — Op Note (Signed)
[name], [name] NO.:  [id number]   MEDICAL RECORD NO.:  [id number]          PATIENT TYPE:  INP   LOCATION:  5529                         FACILITY:  [facility]   PHYSICIAN:  Leonides Grills, M.D.     DATE OF BIRTH:  January 07, 1921   DATE OF PROCEDURE:  04/04/2008  DATE OF DISCHARGE:                               OPERATIVE REPORT   PREOPERATIVE DIAGNOSES:  1. Charcot arthropathy right foot with overlying cellulitis and      impending ulcer.  2. Right tight gastroc.   POSTOPERATIVE DIAGNOSES:  1. Charcot arthropathy right foot with overlying cellulitis and      impending ulcer.  2. Right tight gastroc.   OPERATION:  1. Right gastroc slide.  2. Excision right medial and middle cuneiforms.   ANESTHESIA:  General.   SURGEON:  Leonides Grills, MD   ASSISTANT:  Richardean Canal, PA-C   ESTIMATED BLOOD LOSS:  Minimal.   TOURNIQUET:  None.   COMPLICATIONS:  None.   DISPOSITION:  Stable to PR.   INDICATION:  This is an 77 year old female who has developed over the  last several weeks a progressive deformity from Charcot arthropathy and  overlying cellulitis.  She has been treated on IV antibiotics for 8 days  and due to the fact that she has an overlying cellulitis and some  swelling, we did not have her undergo a full-fledged reconstruction that  would ideally put her foot backward, was previously by doing a first,  second, and third TMT joint fusion, ORIF of fourth and fifth TMT joints.  Due her impending ulcer, we elected to perform a right medial and middle  cuneiform excision for this what was causing the impending ulcer with a  gastroc slide.  We reviewed the risk, which include infection, vessel  injury, progression of Charcot, progression of deformity, persistent  pain, possibly future ulceration, probability of her being in a brace,  possibility of having a deep infection, wound problems, and a more  proximally amputation were all explained.  Questions were  encouraged and  answered.   OPERATION:  The patient was brought to the operating room and placed in  supine position.  After adequate general endotracheal anesthesia was  administered as well as Ancef 1 g IV piggyback, right lower extremity  was then prepped and draped in sterile manner.  No tourniquet was used.  A longitudinal incision on dorsomedial aspect over the first metatarsal  base was then made.  Dissection was carried down through the skin.  Hemostasis was obtained.  The incision was made away from the impending  ulcer dorsolateral to this.  We carefully dissected to the cuneiform.  They were grossly loose and unstable, especially the middle.  We  resected both the middle and medial cuneiforms and this allowed [place] to  completely decompress the impending ulcer.  The area was copiously  irrigated with saline.  Subcu was closed with 3-0 Vicryl.  Skin was  closed with 4-0 nylon.  Prior to this, we did a gastroc slide.   We made a medial incision over the medial aspect  of the gastrocnemius  muscle-tendon junction.  Dissection was carried down through the skin.  Hemostasis was obtained.  Fascia opened in line with the incision.  Conjoin region was then developed between gastroc soleus.  Soft tissue  was elevated of the posterior aspect of gastrocnemius.  Sural nerve was  identified and taken posteriorly.  Gastrocnemius was released with  curved Mayo scissors.  __________ Harlow Ohms.  The areas was copiously  irrigated with normal saline.  Subcu was closed with 3-0 Vicryl and the  skin was closed with 4-0 nylon.  Sterile dressing was applied.  Jones  dressing was applied.  The patient was stable to the PR.     ______________________________  Marcie Bal, M.D.      Leonides Grills, M.D.  Electronically Signed    TAZ/MEDQ  D:  04/04/2008  T:  04/05/2008  Job:  [id number]

## 2010-10-14 NOTE — Discharge Summary (Signed)
Lori [name], Lori [name] NO.:  [id number]   MEDICAL RECORD NO.:  [id number]          PATIENT TYPE:  INP   LOCATION:  5529                         FACILITY:  [facility]   PHYSICIAN:  Massie Maroon, MD        DATE OF BIRTH:  05-13-1921   DATE OF ADMISSION:  03/28/2008  DATE OF DISCHARGE:  04/05/2008                               DISCHARGE SUMMARY   DISCHARGE DIAGNOSES:  1. Cellulitis.  2. Charcot foot status post right gastroc slide, excision right medial      and middle cuneiforms by Dr. Leonides Grills April 04, 2008.  3. Diabetes mellitus with severe neuropathy.  4. Hypertension.  5. Paroxysmal atrial fibrillation, not on Coumadin due to history of      recurrent gastrointestinal bleed.  6. Anemia which required transfusion prior to operation on April 04, 2008.  7. Onychomycosis, tinea.   CONSULTANTS:  Orthopedics, Dr. Lestine Box, Dr. Ranell Patrick.   PROCEDURES:  1. X-ray March 28, 2008 which showed severe Charcot foot with      fragmentation and dislocation at the tarsal metatarsal joints, no      definitive changes of osteomyelitis but could not rule out      osteomyelitis.  2. MRI March 29, 2008 impression:  __________ Charcot mid foot with      dislocation of fragmentation at the Lisfranc joint, there is      extensive associated abnormal edema and enhancement throughout the      mid foot and in the metatarsal basis and anterior calcaneus, there      is also surrounding soft tissue edema and enhancement.  These      imaging findings could be due to Charcot foot and/or osteomyelitis      and are difficult to differentiate by MRI.  Nuclear medicine white      blood cell scan was suggested  3. Nuclear medicine WBC scan impression:  There is asymmetric      increased radial tracer uptake throughout the right foot and ankle      and all three phases and this finding is consistent with      cellulitis.   HOSPITAL COURSE:  This is a pleasant 77 year old female with  complaints  of redness over her right distal lower extremity for the past 2-3 weeks  prior to admission.  She received outpatient oral antibiotics and was  seen by Dr. Arminda Resides, her Dermatologist, on October 26, who switched  her over from Avelox to doxycycline and there was no improvement.  The  patient was sent for admission for IV antibiotics to treat underlying  cellulitis.  The patient was started on Avelox 400 mg IV along with  vancomycin.  The redness initially extended all the way up to about one  half up to the knee on the right side.  This improved with the addition  of IV antibiotics.  The patient had been afebrile.  X-ray could not rule  out osteomyelitis and her initial sed rate was 77 and her CRP was 83.  An MRI  was performed on March 29, 2008 and this was not able to  exclude osteomyelitis as well and so a subsequent WBC scan was performed  which was negative for osteomyelitis.  Dr. Lestine Box an Orthopedic Foot  Surgeon was consulted and performed a gastroc slide as well as a right  medial and middle cuneiform excision.  This was performed on April 04, 2008.  Prior to her surgery, the patient became anemic with a hemoglobin  of 9 and we transfused her with 2 units of packed red blood cells.  The  patient has been stable since her surgery and will be discharged to  rehab.  She will complete a course of 2 weeks of antibiotics for  extensive cellulitis which failed outpatient antibiotic therapy.  She  will follow up with Dr. Lestine Box in about 5-7 days.   PAST MEDICAL HISTORY:  1. Diabetes with neuropathy.  2. Hypertension.  3. Paroxysmal atrial fibrillation (not on Coumadin due to history of      recurrent GI bleeding, recurrent).  4. GERD.  5. GI bleed status post EGD May 31, 2007 which showed two AVMs in      the stomach, status post EGD April 21, 2004 which showed      bleeding folds of the duodenum status post epinephrine injection.  6. Hypothyroidism.   7. Left adrenal hyperplasia on CT scan August 26, 2000   PAST SURGICAL HISTORY:  1. Lung cancer status post resection right lower lung 1995.  2. Partial thyroidectomy in 1982.  3. T and A.  4. Colonoscopy June 27, 2001 which showed internal hemorrhoids.   SOCIAL HISTORY:  The patient does not smoke or drink.  She quit smoking  about 16 years ago.   FAMILY HISTORY:  Her father had a triple A and died at age 2.  Her  mother had melanoma and died at age 59.   ALLERGIES:  1. PENICILLIN  2. SULFA.  3. ASPIRIN.  4. GLUCOPHAGE.  5. COUMADIN.  6. LIPITOR.   DISCHARGE MEDICATIONS:  1. Benicar 40 mg one half daily.  2. Bisoprolol 5 mg one daily.  3. Clotrimazole 1% cream topically b.i.d. to right foot for tinea      pedis p.r.n.  4. Glyburide 10 mg p.o. q.a.m. and 5 mg p.o. q.p.m.  5. Avelox 400 mg p.o. daily.  6. Vancomycin 750 mg IV q.12 hours, to complete a 14-day course on      Avelox as well as vancomycin which will end on April 11, 2008.  7. Ambien 10 mg p.o. nightly p.r.n. insomnia.   PHYSICAL EXAM:  Temperature 98.8, pulse 99, blood pressure 128/75, pulse  ox 97% on room air.  HEENT: Anicteric.  NECK:  No JVP.  HEART:  Irregular, irregular S1-S2, no murmurs, gallops or rubs.  LUNGS:  Clear to auscultation bilaterally.  ABDOMEN:  Soft, nontender, nondistended, positive bowel sounds.  EXTREMITIES: No cyanosis, clubbing,  trace edema in the right foot, good  capillary refill, sensation intact.  NEURO:  Cranial nerves II-XII intact, reflexes 2+, symmetric, diffuse  with no clonus.   Decreased sensation in the feet bilaterally to pinprick.   LABS:  April 06, 2008:  Sodium 139, potassium 4.5, chloride 105,  bicarb 30, BUN 16, creatinine 1.1, WBC 10.3, hemoglobin 11.7, platelet  count 320.   April 05, 2008:  C-reactive protein 6.5, ESR 53 (previously 77 on  admission on March 28, 2008, CRP previously 83 on March 28, 2008 on  admission).  April 03, 2008:   Uric acid 6.2.   ASSESSMENT:  1. Cellulitis, resolving.  2. Charcot foot status post right gastroc slide, excision right medial      and middle cuneiforms April 04, 2008.  3. Diabetes with severe neuropathy.  4. Hypertension.  5. Paroxysmal atrial fibrillation, not on Coumadin due to history of      recurrent gastrointestinal bleed.  6. Anemia requiring transfusion on November 3 with two units packed      red blood cells for hemoglobin of 9 prior to surgery.   PLAN:  The patient will complete a 2-week course of antibiotics.  We  will switch Avelox to 400 mg p.o. daily.  The patient will continue on  vancomycin 750 mg IV q.12.  The patient will follow up with Dr. Lestine Box  in 5 days.  The patient will follow-up with Dr. Selena Batten in 1 week.  We  appreciate our Orthopedic colleagues input on this case.  As well as Dr.  Yetta Barre' input on her cellulitis.      Massie Maroon, MD  Electronically Signed     JYK/MEDQ  D:  04/06/2008  T:  04/06/2008  Job:  [id number]   cc:   [place]. Yetta Barre, M.D.  Leonides Grills, M.D.

## 2010-10-14 NOTE — Consult Note (Signed)
Lori [name], Lori [name]                 ACCOUNT NO.:  [id number]   MEDICAL RECORD NO.:  [id number]          PATIENT TYPE:  INP   LOCATION:  5501                         FACILITY:  [facility]   PHYSICIAN:  Griffith Citron, M.D.DATE OF BIRTH:  06-11-1920   DATE OF CONSULTATION:  DATE OF DISCHARGE:                                 CONSULTATION   GASTROENTEROLOGY CONSULTATION:   REASON FOR CONSULTATION:  GI hemorrhage.   HISTORY OF PRESENT ILLNESS:  Lori [name] is an 77 year old white female  known to me from prior evaluation for similar indications of GI  bleeding.  She has undergone multiple prior GI evaluations in the past,  most recently with endoscopy 2005, at which time she was evaluated by  Dr. Sabino Gasser.  She has undergone multiple endoscopies, colonoscopies,  and enteroscopy in the past.  No convincing lesion has been identified.  She typically presents with melanotic stools.  On one occasion, an upper  endoscopy revealed some oozing of bright red blood in the duodenum.  This was injected with epinephrine.  Biopsies did not show any  underlying pathology.   The current admission presented in an identical fashion, onset of melena  with 1 formed black stool each morning beginning 5 days ago.  After 4  days, she became progressively orthostatic and presented to Dr. Quillian Quince office from where she was sent to the hospital for admission  with a hemoglobin of 7.1.  She has received 2 units of blood thus far  with improvement in hemoglobin to 9.0.  Clinically, she has been  hemodynamically stable.  She did have a mild leukocytosis at the time of  admission, stool was hemoccult positive, and there was no evidence of  coagulopathy with normal pro-time, INR, and PTT.   The patient denies GI symptoms.  No odynophagia, dysphagia,  regurgitation.  No abdominal pain, pyrosis, or extraesophageal  manifestations of reflux.  No hematemesis or hematochezia.  Appetite is  excellent.  Weight has  remained stable.  There are no lower GI symptoms  other than the change in stool color.  No abdominal pain or cramping, no  distention.   There are no risk factors, without aspirin or NSAID use.  No  antiplatelet agents.  The patient denies tobacco or alcohol.   PAST MEDICAL HISTORY:  1. Type 2 diabetes.  2. Dyslipidemia.  3. Hypertension.  4. PAT.  5. Hypothyroid.   ALLERGIES:  PENICILLIN and SULFA.   MEDICATIONS:  1. Benicar.  2. Glyburide.   PAST SURGICAL HISTORY:  Right lobectomy for lung cancer 1990s.   PHYSICAL EXAMINATION:  GENERAL:  Much younger than stated age, alert,  oriented, comfortable, conversational, ambulatory, resting comfortably  in bed.  VITAL SIGNS:  Stable.  HEENT:  Mild conjunctival pallor, anicteric sclerae.  No oropharyngeal  lesion.  NECK:  Supple without adenopathy, thyromegaly, or bruit.  CARDIOVASCULAR:  Normal S1, S2, no gallop or murmur.  CHEST:  Clear to auscultation.  No adventitious sound.  ABDOMEN:  Soft, nontender, nondistended.  No palpable organomegaly.  No  mass or firmness.  Bowel sounds active.  No borborygmi, bruit or splash.  RECTAL:  Not repeated, previously hemoccult positive with black stool.  EXTREMITIES:  No clubbing, cyanosis, edema, or arthritic changes.  NEUROLOGIC:  Grossly intact.   LABORATORY:  Reviewed.  Notable for creatinine 1.3 with a BUN of 96,  glucose 416, CBC with hemoglobin 7.1 on admission, hematocrit 20.5, WBC  13.3 with normochromic normocytic indices.   IMPRESSION:  1. Gastrointestinal bleed--uncertain etiology.  Upper tract lesion is      suggested by patient's prior history of oozing from the duodenal      mucosa, elevated BUN to creatinine ratio, melanotic stool.      Dieulafoy lesion, hemobilia certainly need to be considered given      the enigmatic source of blood loss.  A small-bowel arteriovenous      malformation is also to be considered.  A colonoscopy will be      anticipated, and the patient  will be prepped according with      GoLYTELY lavage.  Ultimately, capsule endoscopy may be indicated,      and we will try to obtain this on an outpatient basis if necessary.      We may do this sooner while the patient is in-patient if bleeding      persists.  2. Anemia--acute gastrointestinal blood loss.   RECOMMENDATION:  1. Panendoscopy.  2. Colonoscopy.  3. Consider capsule endoscopy.  4. Continue clear liquids.  Prep orders written.      Griffith Citron, M.D.  Electronically Signed     JRM/MEDQ  D:  04/05/2007  T:  04/05/2007  Job:  [id number]   cc:   Jaclyn Prime. Lucas Mallow, M.D.

## 2010-10-17 NOTE — Procedures (Signed)
[facility]  Patient:    Lori [name], DOMINIQUE Visit Number: [id number] MRN: [id number]          Service Type: END Location: ENDO Attending Physician:  Sabino Gasser Dictated by:   Sabino Gasser, M.D. Proc. Date: 06/27/01 Admit Date:  06/27/2001                             Procedure Report  PROCEDURE:  Colonoscopy.  INDICATION:  Hemoccult positivity.  ANESTHESIA:  Demerol 30, Versed 2 mg.  DESCRIPTION OF PROCEDURE:  With the patient mildly sedated in the left lateral decubitus position, the Olympus videoscopic pediatric colonoscope was inserted into the rectum and passed under direct vision to the cecum, identified by the ileocecal valve and appendiceal orifice, both of which were photographed.  We entered into the terminal ileum which appeared normal as well.  From this point, the endoscope was slowly withdrawn, taking circumferential views of the entire colonic mucosa, stopping then only in the rectum which appeared normal on direct view and showed large internal hemorrhoids on retroflexed view.  The endoscope was straightened and withdrawn.  The patients vital signs and pulse oximeter remained stable.  The patient tolerated the procedure well without apparent complications.  FINDINGS:  Large internal hemorrhoids.  Otherwise an unremarkable colonoscopic examination including the terminal ileum.  PLAN:  See endoscopy note for further details. Dictated by:   Sabino Gasser, M.D. Attending Physician:  Sabino Gasser DD:  06/27/01 TD:  06/27/01 Job: [id number]

## 2010-10-17 NOTE — H&P (Signed)
NAME:  Lori [name], Lori [name]                           ACCOUNT NO.:  [id number]   MEDICAL RECORD NO.:  [id number]                   PATIENT TYPE:  INP   LOCATION:  0343                                 FACILITY:  [facility]   PHYSICIAN:  Rosanne Sack, M.D.         DATE OF BIRTH:  1920-09-27   DATE OF ADMISSION:  03/09/2003  DATE OF DISCHARGE:                                HISTORY & PHYSICAL   CHIEF COMPLAINT:  Tarry stools, weakness, fatigue for approximately 1 week.   PROBLEM LIST:  1. Transfusable anemia, hemoglobin 7.6, with baseline hemoglobin 12.5 per     June 17, 2001 laboratories.     a. History transfusable anemia secondary to gastrointestinal bleed        receiving approximately 12 transfusions packed red blood cells in        1999.  2. History gastrointestinal bleed (almost continuous) over most of 1999 self-     resolving.     a. CT scan of the abdomen and pelvis August 27, 2002 notes old operative        changes, fluid right base of the lung, mild diffuse fatty infiltrates        liver, left adrenal hyperplasia.  Pelvic portion noted a small amount        of fluid in the right pelvic cul-de-sac otherwise unremarkable.     b. Upper endoscopy Dr. Virginia Rochester June 27, 2001 notes question of Barrett's        esophagus otherwise unremarkable exam.     c. Colonoscopy June 27, 2001 notes large intestinal hemorrhoids        otherwise unremarkable.     d. Upper endoscopy Dr. Virginia Rochester December 21, 2002 listed unremarkable.     e. Upper endoscopy Dr. Virginia Rochester March 09, 2003 listed unremarkable.     f. Previous consult to [facility] for an        enteroscopy finding some duodenal arteriovenous malformation with        ablation done.     g. Patient resumed apparent bleeding with tarry stools approximately 1        week ago.  3. History of atrial fibrillation and mitral valve prolapse.     a. Cardiology - Dr. Lucas Mallow.     b. Distant Holter monitor study and 2-D  echocardiogram - I do not have        either result.     c. The patient previously on Coumadin therapy discontinued for        hemorrhage.  I do not know if there is any current antiplatelets.  4. Hypertension.  5. Diabetes on oral medications only.  6. History lung cancer.     a. Status post lobectomy right lung Dr. Nedra Hai in November 1995.     b. Followed now by Dr. Edwyna Shell.     c. Patient states no chemotherapy or radiation therapy at that time.  7.  Hypothyroidism on Synthroid replacement.     a. Status post thyroidectomy Dr. Orpah Greek.  8. History left humerus neck and left pubic symphysis fracture status post     fall June 18, 2002.  9. History chronic obstructive pulmonary disease.  10.      History gastroesophageal reflux disease.  11.      History hyperlipidemia.  12.      History anal fissure repair Dr. Orpah Greek.  13.      History left breast biopsy 1991.  14.      Allergies listed SULFA causing rash, PENICILLIN causing rash,     COUMADIN resulting hemorrhage, LOZOL causing palpitation.   HISTORY OF PRESENT ILLNESS:  This 77 year old female is followed by Dr.  Lucas Mallow, cardiology and primary care.  She is also followed along by Dr. Virginia Rochester,  gastroenterology.  The patient had an extensive period of almost continuous  GI bleed in 1999.  She was transfused on at least 12 different occasions per  her report.  She had both upper and lower endoscopy at that time finding no  obvious source of bleeding.  She was ultimately sent to [facility] for  an enteroscopy finding duodenal AVMs with some ablation done at that time.  Her bleeding ultimately resolved until approximately 1 week ago she again  began noticing tarry stools.  Her hemoglobin prior to this was in the mid 12  range.  She saw Dr. Virginia Rochester and he made arrangements for an upper endoscopy to  be done today.  The procedure was done at [facility] intended  outpatient with an unremarkable exam.  Unfortunately, her CBC indicated  the  transfusable anemia,  hemoglobin 7.6.  She is admitted for transfusion and  then capsule endoscopy on March 12, 2003.  She has a fairly complicated  past medical history as indicated above.   FAMILY HISTORY:  Noncontributory in this 77 year old female.   SOCIAL HISTORY:  The patient is widowed.  She has two adult female children.  She was a distant smoker, quitting in 1995.  No alcohol use.  She was  employed as an Film/video editor prior to retirement.  She lives in  independently, is active and independent of ADL and IADL.   REVIEW OF SYSTEMS:  HEENT:  Has had no recent falls or head trauma.  States  she has been dizzy and weak over the past 48-72 hours.  Denies fever.  Eyes  - Has had previous cataract surgery bilaterally.  Ears - Mild hearing  deficit, does not using hearing aids.  Nose - No rhinitis or sinusitis.  Mouth - No chewing or swallowing difficulties.  NECK:  Has had a previous  partial thyroidectomy, now hypothyroid.  HEART:  History chronic atrial  fibrillation and mitral valve prolapse.  Denies any current angina or  palpitation.  LUNGS:  History of lung cancer status post right lobectomy.  History of COPD.  Denies any current dyspnea with exertion, orthopnea.  ABDOMEN:  Denies nausea or vomiting.  Has noted black/tarry stools since  approximately Thursday last week.  One incident of diarrhea.  No frank  blood.  Denies any abdominal pain.  Her extensive GI history as above.  URINARY/GENITAL:  Denies dysuria or other symptomatology suggestive of  urinary tract infection.  MUSCULOSKELETAL:  History of left humeral neck and  left pubic symphysis fracture in January 2004 status post fall.  NEUROLOGIC:  Denies any focal weakness or unilateral changes.  ENDOCRINE:  History of  diabetes on oral  medications only for management and diet modification.   MEDICATIONS AT TIME OF ADMISSION:  1. Glucophage XR 750 mg twice daily. 2. Glyburide 5 mg, two tablets equaling 10 mg daily  in the morning.  3. Cozaar 100 mg daily.  4. Digoxin (Lanoxicaps) 0.2 mg daily.  5. Protonix 40 mg daily per samples given office visit Dr. Virginia Rochester March 08, 2003.  6. Synthroid 0.5 mg daily.   LABORATORY DATA AT TIME OF ADMISSION:  PTT was 34, PT 13.5, INR 1.  A CBC  without differential found WBC 10.6, hemoglobin 7.6, hematocrit 22.1,  platelets 241.  Other lab work as per admission orders.   PHYSICAL EXAMINATION:  VITAL SIGNS PER ENDOSCOPY RECORDS:  Pulse 96,  respirations 20s, blood pressure 136/58, O2 saturations 99-100%.  GENERAL:  This is a well-developed elderly female in no acute distress.  She  is alert and oriented x4.  HEENT:  Head - Normocephalic.  Eyes - Pupils equal and reactive.  Extraocular movements intact.  Ears - Canals are clear and hearing mildly  diminished.  Nose - Nares patent without masses or discharge noted.  Oral  mucosa - Pink and moist.  NECK:  No jugular venous distention or bruits noted.  Small surgical scar  from previous thyroid surgery.  HEART:  Rate and rhythm primarily regular with occasional irregular beats.  A 1/6 systolic ejection murmur.  There is no jugular venous distention or  significant peripheral edema.  LUNGS:  Absent breath sounds right base.  The patient is clear other fields  though mildly diminished.  No distress or cough.  ABDOMEN:  Soft and round with positive bowel sounds.  No pain or masses with  palpation.  No organomegaly noted.  URINARY/GENITAL:  No bladder pain or CVA tenderness.  Genital exam deferred  as noncontributory.  RECTAL:  Deferred as noncontributory.  The stool is already known occult  blood positive.  MUSCULOSKELETAL:  Range of motion is full except mildly decreased left  shoulder area.  Grip strength strong and equal bilaterally.  Strength  estimated at 5/5 and equal all four extremities.  NEUROLOGIC:  Cranial nerves II-XII grossly intact.  No unilateral or focal  defects noted.  INTEGUMENTARY:  Skin warm  and dry.  No obvious ulcers or skin breakdown  noted.  MENTAL STATUS:  The patient is alert and oriented x4.   IMPRESSIONS AND PLAN:  Problem 1. TRANSFUSABLE ANEMIA SECONDARY TO  GASTROINTESTINAL BLEED.  This has been a persistent problem first in 1999  and now with repeat bleeding.  This has progressed for approximately 1 week.  Hemoglobin has gone from mid 12 range down to current 7.6.  Plan transfusion  2 units of packed red blood cells.  Daily hemoglobin and hematocrit checks  to follow.  Stool again is already known occult blood positive.  The patient  is status post upper endoscopy by Dr. Virginia Rochester today with no significant  findings.  Plan transfuse as necessary over the weekend.   Problem 2. GASTROINTESTINAL BLEED WITH PREVIOUS HISTORY OF MULTIPLE UPPER  ENDOSCOPIES AND COLONOSCOPIES.  Admitted for transfusion as above and then  capsule endoscopy planned for March 12, 2003.  Continue proton pump inhibitor but would increase this to twice daily and follow along with  gastroenterology over the course of hospitalization.   Problem 3. DIABETES.  Question degree of control.  The patient does not  routinely check her blood sugars at home.  Would continue with her current  medications.  Would follow CBGs twice daily and add a sliding scale insulin  as needed.  Would check a hemoglobin A1C.   Problem 4. HYPERTENSION.  Question control.  Continue current medications  and follow a comprehensive metabolic panel and particularly renal function  with this ACE inhibitor.  Follow blood pressures and adjust medications as  indicated.   Problem 5. HISTORY OF CHRONIC ATRIAL FIBRILLATION AND MITRAL VALVE PROLAPSE.  Would continue current dosing of digoxin and follow a digoxin level.  I do  not see any indication for telemetry at this point.   Problem 6. HYPOTHYROIDISM.  Would continue current dose of Synthroid and  follow a serum TSH level.   DISPOSITION:  General medical bed, service Dr. Elliot Gurney, [phone].   ACTIVITY:  Up to chair and bathroom as tolerated.   DIET:  Carbohydrate modified, low sodium.   CONDITION ON ADMISSION:  Guarded.    Everett Graff, N.P.                     Rosanne Sack, M.D.   TC/MEDQ  D:  03/09/2003  T:  03/09/2003  Job:  [id number]

## 2010-10-17 NOTE — Op Note (Signed)
   NAME:  Lori [name], Lori [name]                           ACCOUNT NO.:  [id number]   MEDICAL RECORD NO.:  [id number]                   PATIENT TYPE:  AMB   LOCATION:  ENDO                                 FACILITY:  [facility]   PHYSICIAN:  Georgiana Spinner, M.D.                 DATE OF BIRTH:  18-Jul-1920   DATE OF PROCEDURE:  12/21/2002  DATE OF DISCHARGE:                                 OPERATIVE REPORT   PROCEDURE PERFORMED:  Upper endoscopy.   ENDOSCOPIST:  Georgiana Spinner, M.D.   INDICATIONS FOR PROCEDURE:  Gastroesophageal reflux disease.   ANESTHESIA:  Demerol 40 mg, Versed 4 mg.   DESCRIPTION OF PROCEDURE:  With the patient mildly sedated in the left  lateral decubitus position, the Olympus video endoscope was inserted in the  mouth and passed under direct vision through the esophagus which appeared  normal.  We entered into the stomach.  The fundus, body, antrum, duodenal  bulb and second portion of the duodenum all appeared normal.  From this  point, the endoscope was slowly withdrawn taking circumferential views of  the duodenal mucosa until the endoscope was pulled back into the stomach and  placed on retroflexion to view the stomach from below.  The endoscope was  then straightened and withdrawn taking circumferential views of the  remaining gastric and esophageal mucosa.  The patient's vital signs and  pulse oximeter remained stable.  The patient tolerated the procedure well  without apparent complications.   FINDINGS:  Unremarkable examination.   PLAN:  Have patient follow up with me as needed.                                                 Georgiana Spinner, M.D.    GMO/MEDQ  D:  12/21/2002  T:  12/21/2002  Job:  [id number]

## 2010-10-17 NOTE — Procedures (Signed)
[facility]  Patient:    MISTINA, COATNEY Visit Number: [id number] MRN: [id number]          Service Type: END Location: ENDO Attending Physician:  Sabino Gasser Dictated by:   Sabino Gasser, M.D. Proc. Date: 06/27/01 Admit Date:  06/27/2001                             Procedure Report  PROCEDURE:  Upper endoscopy.  INDICATION:  Rectal bleeding.  ANESTHESIA:  Demerol 50, Versed 5 mg.  DESCRIPTION OF PROCEDURE:  With patient mildly sedated in the left lateral decubitus position, the Olympus videoscopic endoscope was inserted into the mouth and passed under direct vision through the esophagus which appeared normal other than a question of a small segment of Barretts esophagus which was photographed and biopsied.  We entered into the stomach.  Fundus, body, antrum, duodenal bulb, and second portion of duodenum were all well-visualized and appeared normal.  From this point, the endoscope was slowly withdrawn, taking circumferential views of the entire duodenal mucosa until the endoscope then pulled back into the stomach and placed in retroflexion to view the stomach from below.  The endoscope was then straightened and withdrawn, taking circumferential views of the remaining gastric and esophageal mucosa.  The patients vital signs and pulse oximeter remained stable.  The patient tolerated the procedure well without apparent complications.  FINDINGS:  Question of short segment Barretts esophagus, otherwise an unremarkable examination.  PLAN:  Proceed to colonoscopy.  The patient will call me for results of biopsy and follow up with me as an outpatient. Dictated by:   Sabino Gasser, M.D. Attending Physician:  Sabino Gasser DD:  06/27/01 TD:  06/27/01 Job: [id number]

## 2010-10-17 NOTE — Discharge Summary (Signed)
NAME:  Lori [name], Lori [name]                           ACCOUNT NO.:  [id number]   MEDICAL RECORD NO.:  [id number]                   PATIENT TYPE:  INP   LOCATION:  0343                                 FACILITY:  [facility]   PHYSICIAN:  Rosanne Sack, M.D.         DATE OF BIRTH:  [birth year]   DATE OF ADMISSION:  03/09/2003  DATE OF DISCHARGE:  03/12/2003                                 DISCHARGE SUMMARY   CHIEF COMPLAINT:  Tarry stools, weakness, fatigue for approximately 1 week.   DISCHARGE DIAGNOSES:  1. Gastrointestinal bleed.     A. History of gastrointestinal bleed almost continuously over most of        1999, self-resolved.     B. A CT scan of the abdomen and pelvis on August 27, 2002, noted old        operative changes, fluid right base of the lung, mild diffuse fatty        infiltrates liver, left adrenal hyperplasia. Pelvic portion noted        small  amount of fluid in the right  pelvic cul-de-sac, otherwise        unremarkable.     C. Upper endoscopy by Dr. Sabino Gasser on June 27, 2001, noted question        Barrett's esophagus, otherwise  unremarkable.     D. Colonoscopy June 27, 2001, noted large intestinal hemorrhoids,        otherwise  unremarkable.     E. Upper endoscopy by Dr. Sabino Gasser on December 21, 2002, listed        unremarkable.     F. Upper endoscopy by Dr. Sabino Gasser on March 09, 2003, listed        unremarkable.     G. 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     H. The patient resumed active bleeding with tarry stools approximately        one week prior to this hospitalization.        A. Upper endoscopy by Dr. Sabino Gasser on March 09, 2003, noted           unremarkable examination.     I. Capsule endoscopy done March 12, 2003, preliminary report listed per        hospital  chart, negative examination.     J. Per discharge instructions, requested  to call Dr. Wende Neighbors office in 4        weeks for an appointment. Also requested a repeat CBC at Dr. Wende Neighbors        office 1 week post discharge. The patient again requested to call for        an appointment.  2. Transfusable anemia secondary to problem #1.     A. Transfused 2 units packed red blood cells over  the course of  hospitalization.     B. Baseline hemoglobin 12.5 per June 17, 2001, labs.     C. Admission hemoglobin 7.6. Hemoglobin  post transfusion 11.3 and        discharge hemoglobin 10.9.     D. Again the patient requested to have a repeat complete blood count        approximately  1 week post discharge at Dr. Wende Neighbors office.  3. Bronchitis versus right lower lobe pneumonia.     A. Chest x-ray on March 11, 2003, noted atelectasis, right base.     B. Leukocytosis on admission, 12.4, resolved prior to discharge. The        patient placed on a 5-day course of azithromycin.  4. Diabetes. Roughly stable over the course of hospitalization. The patient     restarted on previous medications at discharge.  5. Hypertension. Blood pressure stable over the course of hospitalization.  6. Hypothyroidism. On Synthroid. TSH was normal 3.714 over the course of     hospitalization.  7. History of atrial fibrillation and mitral valve prolapse.     A. Cardiologist and primary care Raymie Giammarco Dr. Lucas Mallow at [facility].     B. Holter monitor  study and 2D echocardiogram without results available        for report.     C. Previously on Coumadin therapy, discontinued for gastrointestinal        bleeding. I do not have any indication that the patient is on current        antiplatelets.  8. History of lung cancer.     A. Status post  lobectomy right lung, Dr. Nedra Hai, November 1995.     B. Followed  now by Dr. Edwyna Shell.     C. The patient states she had no follow up chemotherapy or radiation        therapy at the time of this surgery.  9. History of left femoral neck  and  left pubic symphysis fracture, status     post  fall June 18, 2002.  10.      Chronic obstructive pulmonary disease.  11.      Gastroesophageal reflux disease.  12.      Hyperlipidemia.  13.      Anal fissure repair, Dr. Orpah Greek.  14.      History of left breast biopsy in 1991.   ALLERGIES:  1. SULFA CAUSING RASH.  2. PENICILLIN CAUSING RASH.  3. CODEINE CAUSING HEMORRHAGE.  4. LOZOL CAUSING PALPITATIONS.   DISCHARGE MEDICATIONS:  The patient requested to start previous home  medications per discharge  patient information sheet which were reported as  below.  1. Glucophage XR 750 mg b.i.d.  2. Glyburide 5 mg. Give 2 tablets equaling 10 mg daily in the morning.  3. Cozaar 100 mg daily.  4. Digoxin (Lanoxicaps) 0.2 mg daily.  5. Protonix 40 mg daily. Recently started March 08, 2003, by Dr. Virginia Rochester per     office samples.  6. Synthroid  0.5 mg daily.   DISCHARGE INSTRUCTIONS:  1. Primary care post discharge, followed by Dr. Lucas Mallow, cardiology.     A. Follow up with Dr. Virginia Rochester in approximately 4 weeks post discharge. The        patient requested to call his office for an appointment.     B. Follow up complete blood count approximately 1 week post discharge  at        Dr. Wende Neighbors office.  The patient is requested to call for an appointment.  2. The patient instructed to call Dr. Wende Neighbors office for any pain, nausea or     elevated temperature or when she passes capsule.  3. Diet resumed as per discharge instructions as previous. This was a low     sodium  and 1800 calorie ADA.   CONSULTS:  Georgiana Spinner, M.D., gastroenterology.   PROCEDURES:  1. Transfusion 2 units packed red blood cells.  2. Upper endoscopy March 09, 2003.  3. Capsule endoscopy March 12, 2003. Preliminary results as above.   DISCHARGE LABORATORY DATA:  CBC March 12, 2003, found WBCs 7.2, hemoglobin  10.9, hematocrit 31.1, platelets 245. Stool  for occult blood x2 at admission were positive and final stool on  March 12, 2003, was negative.  Serum TSH 3.714. Digoxin 1.5. Urinalysis  appeared  unremarkable.   For dictated history of present illness, family history, social history,  review of systems, admission medications, laboratory data, physical  examination, impressions and plan, please see dictated admission history and  physical dated March 09, 2003.   HOSPITAL COURSE:  PROBLEM #1, GI BLEED:  This 77 year old female is followed  by Dr. Lucas Mallow with cardiology and primary care at [facility]. She was admitted per Dr. Wende Neighbors request under internal medicine  service, [facility] hospitalist Dr. Nolon Rod Monguilod. The  patient had a previous history of almost a full year of continuous GI bleed  in 1999. She had at least 12 separate transfusions over that time. She had  multiple  colonoscopies and upper endoscopies to investigate this with no  significant findings. Ultimately she was sent to [facility] for  enteroscopy, finding duodenal AVMs with some of these ablated at that time.   Her bleeding resolved until approximately  one week ago she began noticing  tarry stools. Her baseline hemoglobin  was 12 range. She came to [facility] for repeat  upper endoscopy on March 09, 2003, and screening labs  found her hemoglobin  to be 7.6. She had no  remarkable findings per her  upper endoscopy. The plan was to admit her for transfusion and a capsule  endoscopy.   Again she had her upper endoscopy by Dr. Virginia Rochester on March 09, 2003. This found  no acute abnormalities. On March 12, 2003, she had a capsule endoscopy.  There is a preliminary report  from Dr. Melvia Heaps in the patient's chart  from March 12, 2003, with results indicating negative examination. She was  discharged with plan to have a followup CBC approximately 1 week post  discharge  at Dr. Wende Neighbors office and then a follow up with Dr. Virginia Rochester in  approximately  4 weeks.   PROBLEM #2,  TRANSFUSABLE ANEMIA:  Again the patient's baseline  hemoglobin  in the 12s. On admission her hemoglobin  was found to be transfusable at  7.6. The patient was transfused 2 units of packed red blood cells. Post  transfusion hemoglobin  was 10.3 and prior to discharge this settled out at  10.9. The patient was requested to follow up at Dr. Wende Neighbors office for a  repeat CBC approximately 1 week post discharge.   PROBLEM #3, ACUTE BRONCHITIS VERSUS RIGHT LOWER LOBE PNEUMONIA:  The patient  had complaints of productive cough. She does have a history of COPD listed.  A chest x-ray was done in October 2004 and indicated atelectasis, right  base. She was started on azithromycin for suspected acute bronchitis.  She  did have some leukocytosis, 10.6 on admission, and this climbed on March 11, 2003, to 12.4. She was started on azithromycin course and this was  planned to continue for 5 days. Per March 12, 2003, labs, her WBC was 7.2. It is unclear to me from the discharge material if the patient received a  prescription to continue the azithromycin post discharge.   PROBLEM #4, DIABETES:  This is generally controlled with oral medications as  above and diabetic diet. The patient had a low CBG of 74 prior to her  capsule endoscopy, but she was n.p.o. I held her oral medications at that  time to avoid further hypoglycemia. Post capsule endoscopy, her routine diet  was restarted. Per discharge instructions, she was requested to restart her  previous medications.   PROBLEM #5, HYPERTENSION:  The patient has a known history and her blood  pressure remained stable over the course of hospitalization.   PROBLEM #6, HYPOTHYROIDISM: The patient has a known history, on chronic  Synthroid replacement. Her TSH was stable at 3.714. She was requested to  restart her Synthroid  at  her previous  dosing at discharge.   DISPOSITION:  The patient is returning home where she lives independent. She  will follow up  as indicated above. Activity up as tolerated without  restriction. Discharge diet 1800 calorie ADA with no added salt.    CONDITION ON DISCHARGE:  Stable, improved.       Everett Graff, N.P.                     Rosanne Sack, M.D.    TC/MEDQ  D:  03/20/2003  T:  03/20/2003  Job:  [id number]

## 2010-10-17 NOTE — Discharge Summary (Signed)
Lori [name], VANDERHEIDEN NO.:  [id number]   MEDICAL RECORD NO.:  [id number]          PATIENT TYPE:  INP   LOCATION:  5524                         FACILITY:  [facility]   PHYSICIAN:  Isidor Holts, M.D.  DATE OF BIRTH:  1920-09-02   DATE OF ADMISSION:  04/19/2004  DATE OF DISCHARGE:  04/22/2004                                 DISCHARGE SUMMARY   DISCHARGE DIAGNOSES:  1.  Recurrent gastrointestinal bleed, status post upper gastrointestinal      endoscopy on April 21, 2004, which showed two bleeding folds in the      duodenum, status post epinephrine injection and biopsy.  Pathology was      unremarkable.  2.  Diabetes mellitus.  3.  Dyslipidemia.  4.  Hypertension.  5.  History of atrial fibrillation.  6.  History of gastroesophageal reflux disease.   DISCHARGE MEDICATIONS:  1.  Protonix 40 mg p.o. b.i.d.  2.  Preadmission medications.   PROCEDURES:  1.  Upper gastrointestinal endoscopy on April 21, 2004, carried out by      Dr. Virginia Rochester.  This demonstrated two bleeding folds in the duodenum as stated      above.  Epinephrine injections and biopsy was carried out.  2.  Transfusion with 2 units of packed red blood cells on April 19, 2004.   CONSULTATIONS:  1.  Georgiana Spinner, M.D., gastroenterology.  2.  Lina Sar, M.D., gastroenterology.   HISTORY OF PRESENT ILLNESS:  As in HPI of April 19, 2004, but in brief,  this is an 77 year old, Caucasian female with a past medical history  significant for recurrent GI bleed extensively worked up in the past. She  presents on this occasion, with a 4 day history of black, tarry stools  unassociated with abdominal pain or hematemesis.  She denies NSAID use.  She  has become gradually short of breath.  Hemoglobin was found to be 9.5 in the  emergency room.  Stool guaiac was positive and hemodynamically stable.  She  was admitted for further evaluation, investigation and management.   HOSPITAL COURSE:  Problem 1.   RECURRENT UPPER GASTROINTESTINAL BLEED:  On  this occasion, the patient appears to have had episodes of upper GI bleed as  evidenced by melena stools.  There was no other associated symptomatology.  Given the fact that hemoglobin was 9.5, she was managed with transfusion of  2 units of PRBCs resulting in post transfusion hemoglobin level of 12.3.  No  further melena stools were noted during the course of hospital admission.  She underwent upper GI endoscopy by Dr. Sabino Gasser on April 21, 2004.  This showed two bleeding folds in the duodenum as stated above.  Area was  injected with epinephrine.  Biopsy was taken which showed no concerning  pathology.  The patient remained hemodynamically stable throughout the  course of her stay.   Problem 2.  ANEMIA:  This was acute and post hemorrhagic secondary to #1  above managed with 2 units of blood transfusion.  Hemoglobin remained stable  thereafter.   Problem 3.  HISTORY OF  HYPOTHYROIDISM ON SYNTHROID:  This was continued  during the course of her hospital stay.   Problem 4.  HISTORY OF DIABETES MELLITUS:  The patient remained euglycemic  on sliding scale coverage as well as Glyburide.   Problem 5.  BLOOD PRESSURE:  Blood pressure remained controlled during the  course of hospital stay.   Problem 6.  ATRIAL FIBRILLATION:  The patient has a known history of atrial  fibrillation which appears rate controlled.  Presently, she has no evidence  of heart failure or rapid ventricular response during her hospital stay.  Anticoagulation, is of course not an option, given her bleeding history.   DISPOSITION:  She was discharged in satisfactory condition on April 22, 2004.   ACTIVITY:  As tolerated.   DIET:  ADA, healthy-heart diet.   SPECIAL INSTRUCTIONS:  She is strongly cautioned to avoid NSAIDS at home.   FOLLOW UP:  Follow-up appointment will be with Dr. Lucas Mallow on May 23, 2004, at 11:45 a.m.  She is also to follow up with Dr.  Virginia Rochester,  gastroenterology, on May 01, 2004, at 1:45 p.m.  The patient has  verbalized understanding.      Chri   CO/MEDQ  D:  04/23/2004  T:  04/24/2004  Job:  [id number]   cc:   Jaclyn Prime. Lucas Mallow, M.D.  [address]  [place]  [place] [address]  Fax: [phone]   Georgiana Spinner, M.D.  [address]  [place]  [place] [address]  Fax: [phone]

## 2010-10-17 NOTE — Op Note (Signed)
NAME:  Lori [name], Lori [name] NO.:  [id number]   MEDICAL RECORD NO.:  [id number]          PATIENT TYPE:  INP   LOCATION:  5524                         FACILITY:  [facility]   PHYSICIAN:  Georgiana Spinner, M.D.    DATE OF BIRTH:  [birth year]   DATE OF PROCEDURE:  DATE OF DISCHARGE:                                 OPERATIVE REPORT   PROCEDURE PERFORMED:  Upper endoscopy.   ENDOSCOPIST:  Georgiana Spinner, M.D.   INDICATIONS FOR PROCEDURE:  Gastrointestinal bleeding.   ANESTHESIA:  Demerol 50 mg, Versed 4.5 mg.  3 mL of epinephrine were  injected.   DESCRIPTION OF PROCEDURE:  With the patient mildly sedated in the left  lateral decubitus position, the Olympus videoscopic endoscope was inserted  in the mouth and passed under direct vision through the esophagus into the  stomach to the duodenal bulb and second portion of the duodenum.  In the  second portion of the duodenum I noticed some fresh blood on the mucosa and  this was photographed.  The endoscope was slowly withdrawn taking  circumferential views of the duodenal mucosa until the endoscope was pulled  back into the stomach and placed on retroflexion to view the stomach from  below. We had noticed on the pullback that there was an area that appeared  to have more blood and the mucosa appeared somewhat irregular in this area  and after two attempts, I could not really  hold the scope in this position,  to therefore, I removed this endoscope and replaced it with a side-viewing  duodenoscope which we passed in the office the mouth under direct vision,  passed through the esophagus, stomach into the duodenum.  In the duodenum,  there were two linear holes that appeared to be oozing some blood and this  was photographed.  I then biopsied this area between the two folds and  injected epinephrine 3 mL superiorly, inferiorly in the middle of this area.  The endoscope was then withdrawn.  The patient's vital signs and pulse  oximeter  remained stable.  The patient tolerated the procedure well without  apparent complications.   FINDINGS:  Two linear folds in the duodenum just distal to the bulb at the  posterior turn which may be the cause of patient's blood loss.   PLAN:  Will await biopsy and epinephrine treatment.  Would again hold  NSAIDs, continue proton pump inhibitor therapy and follow clinical response.       GMO/MEDQ  D:  04/21/2004  T:  04/21/2004  Job:  [id number]

## 2010-10-17 NOTE — H&P (Signed)
Lori [name], DROLET NO.:  [id number]   MEDICAL RECORD NO.:  [id number]          PATIENT TYPE:  INP   LOCATION:  1825                         FACILITY:  [facility]   PHYSICIAN:  Marcie Mowers, M.D.DATE OF BIRTH:  [birth year]   DATE OF ADMISSION:  04/19/2004  DATE OF DISCHARGE:                                HISTORY & PHYSICAL   PHYSICIANS:  1.  Jaclyn Prime. Lucas Mallow, M.D., primary care physician.  2.  Georgiana Spinner, M.D., gastroenterologist.   CHIEF COMPLAINT:  Black, tarry stools for four days.   HISTORY OF PRESENT ILLNESS:  This is a pleasant 77 year old Caucasian female  with a significant past medical history of GI bleed and extensive testing,  as elaborated below.  The patient presented to the emergency department this  morning due to history of black, tarry stools for the past four days.  The  frequency was once a day.  Last bowel movement was this morning and it was  black.  She does not have any associated symptoms like abdominal pain,  nausea, vomiting, diarrhea, any chest pain.  She does have some shortness of  breath on and off but this is present for several weeks.  She called her  primary care physician yesterday and was told to get blood work done at the  outpatient lab.  Then she received a call from her primary care physician's  clinic at night and was told to go to the emergency department right away.  The patient does not know how much her hemoglobin was.  She came this  morning.   Today, her hemoglobin in the emergency department is 9.5.  Her stool guaiac  was positive in the emergency department.   PAST MEDICAL HISTORY:  1.  GI bleed:  She has a significant history.  She reportedly was bleeding      almost continuously most of 1999 which resolved spontaneously.  She had      an upper endoscopy in January of 2003 which was overall unremarkable.      She has also had colonoscopy in January of 2003 which showed large      internal hemorrhoids  but otherwise unremarkable.  She has had upper      endoscopy in July of 2004 listed unremarkable and then again in October      of 2004 which was also unremarkable.  The patient then had an      enteroscopy done at [facility].  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  Her      last admission was in October of 2004 secondary to melena.  At that      time, she underwent a capsule endoscopy in addition to test mentioned      above.  Since that episode of GI bleed, she did not any recurrence.  She      follows up with Dr. Melrose Nakayama. Virginia Rochester as an outpatient.  2.  History of diabetes type 2.  3.  Hypertension.  4.  Anemia:  The anemia  is mostly secondary to GI bleed.  Reportedly, she      has had up to 12 transfusions of packed red blood cells in 1999.  5.  Her last hospitalization, which has in October of 2004, also she had      blood transfusions.  Her baseline hemoglobin used to be around 12.  6.  History of lung cancer, reportedly bronchioalveolar carcinoma.  She is      status post right lower lobectomy in 1995.  She did not require any      chemotherapy or radiation therapy.  7.  Hypothyroidism:  She is status post thyroidectomy and she has been on      Synthroid.  8.  Atrial fibrillation:  She takes digoxin.  She is not on any      anticoagulation secondary to GI bleed.  9.  GERD.  10. Anal fissure repair.  11. Hyperlipidemia.  12. History of left femoral neck and left pubic symphysis fracture in      January of 2004.  13. History of left breast biopsy in 1991.  14. Per E-chart, she reportedly has a history of COPD but upon questioning      the patient denies having this diagnosis.   MEDICATIONS:  1.  Cozaar #2.  2.  Lanoxicaps.  3.  Glyburide.  4.  Synthroid.  5.  She was on Protonix and then she discontinued.  She was going to resume      it today.  The dosages of above medications are unknown at this point.       The patient will get family to bring list of her medications and      dosages.   ALLERGIES:  PENICILLIN, SULFA, CODEINE.   FAMILY HISTORY:  Noncontributory.   SOCIAL HISTORY:  The patient is widowed.  Lives by herself.  She has two  adult female children, one of them lives close by.  She has a 20-pack-year  smoking history.  She quit in 1995.  She lives independently and performs  all of her activities of daily living and instrumental activities of daily  living and is quite active.   REVIEW OF SYMPTOMS:  As mentioned in the HPI, she mentions having some  shortness of breath on and off and it has mostly to do with walking two  blocks or more.  She has had this for several weeks.  She also has a cough,  mostly which is dry.  The patient mentions having tingling and numbness in  her feet on and off, worse at night.  She mentions having increased  frequency of urination on and off.  She denies having any fevers or chills,  any headaches, any chest pain.   PHYSICAL EXAMINATION:  GENERAL:  She is alert and does not look in acute  distress.  VITAL SIGNS:  Blood pressure is 126/50, heart rate is 80 per minute, and it  is irregular, respirations 16, temperature is 98.2.  Pulse oximeter is 99%  on room air.  HEENT:  Her pharynx is not erythematous and without any exudates.  Extraocular movements intact.  Pupils are equal, round, and reactive to  light.  There is no conjunctival pallor.  No scleral icterus.  Her head is  normocephalic and atraumatic.  NECK:  Supple.  There is no jugular venous distention.  No carotid bruits.  No lymphadenopathy.  CARDIOVASCULAR:  S1 is very able, irregularly irregular.  No murmurs  appreciated.  LUNGS:  There are minimal wheezing  sounds on initial exam which cleared upon  coughing.  Otherwise clear to auscultation.  ABDOMEN:  Soft, nontender, and nondistended.  Bowel sounds are normally active in all quadrants.  No hepatosplenomegaly.  EXTREMITIES:  Without  any clubbing, cyanosis, or edema.  Pedal pulses are  adequate.  NEUROLOGICAL:  The patient is alert and oriented x 3.  There is no focal  deficits.  Her cranial nerves II through XII intact.  There is no sensory  deficit appreciated.   LABORATORY DATA:  Hemoglobin is 9.5, hematocrit is 28.  Sodium is 137,  potassium 4.1, chloride 104, BUN 44, creatinine 1.1, blood sugar is 315.  Total protein is 5.3, albumin is 3.2, AST 20, ALT 18, alkaline phosphate 48,  total bilirubin 0.5.  INR is 1.1.  PT 13.6, PTT 35.  Digoxin level is 0.5  with therapeutic range being 0.8 to 2.   ASSESSMENT/PLAN:  1.  Gastrointestinal bleed:  The patient has a significant past medical      history, extensive testing with findings of duodenal arteriovenous      malformation and she is status post ablation.  At present, we will      transfuse two units of packed red blood cells-the first unit is in      progress.  We will check her hemoglobin and hematocrit q.12h. and follow      closely.  Also we will consult gastroenterology at this time.  We will      start Protonix 40 mg p.o. q.d.  For now, we will keep her on a clear      liquid diet until further input is obtained from gastroenterology.  2.  Diabetes type 2:  We will get her capillary blood sugars q.h.s. and      q.a.c.  We will continue her usual dose of Glyburide once it is      available.  The patient states that family will get medication list and      dosages.  We will check hemoglobin A1C if it was not done in the past      three months.  3.  Hypertension:  At present, her blood pressure looks well controlled.  We      will continue her usual dose of Cozaar.  4.  Atrial fibrillation:  It is rate controlled.  She is on digoxin.  Her      digoxin level is 0.5.  We will resume her usual dose of digoxin.  No      anticoagulation secondary to gastrointestinal bleed.  5.  Hypothyroidism:  We will continue her on Synthroid.  6.  Anemia:  This is secondary to  the gastrointestinal bleed.  Please see      #1.  7.  Gastroesophageal reflux disease:  We will start her on Protonix 40 mg      p.o. q.d.  8.  We will admit the patient to telemetry.       PMJ/MEDQ  D:  04/19/2004  T:  04/19/2004  Job:  [id number]

## 2010-10-17 NOTE — Op Note (Signed)
   NAME:  Lori [name], Lori [name]                           ACCOUNT NO.:  [id number]   MEDICAL RECORD NO.:  [id number]                   PATIENT TYPE:  AMB   LOCATION:  ENDO                                 FACILITY:  [facility]   PHYSICIAN:  Georgiana Spinner, M.D.                 DATE OF BIRTH:  16-Sep-1920   DATE OF PROCEDURE:  DATE OF DISCHARGE:                                 OPERATIVE REPORT   PROCEDURE:  Upper endoscopy.   INDICATIONS FOR PROCEDURE:  GI bleed.   ANESTHESIA:  Demerol 40, Versed 4 mg.   DESCRIPTION OF PROCEDURE:  With the patient mildly sedated in the left  lateral decubitus position, the Olympus videoscopic endoscope was inserted  in the mouth and passed under direct vision through the esophagus which  appeared normal into the stomach, fundus, body, antrum, duodenal bulb and  second portion of the duodenum all appeared normal. From this point, the  endoscope was slowly withdrawn taking circumferential views of the duodenal  mucosa until the endoscope was then pulled back in the stomach, placed in  retroflexion to view the stomach from below. The endoscope was then  straightened and withdrawn taking circumferential views of the remaining  gastric and esophageal mucosa. The patient's vital signs and pulse oximeter  remained stable. The patient tolerated the procedure well without apparent  complications.   FINDINGS:  Unremarkable examination.   PLAN:  Capsule endoscopy.                                               Georgiana Spinner, M.D.    GMO/MEDQ  D:  03/09/2003  T:  03/09/2003  Job:  [id number]

## 2011-02-18 LAB — CBC
HCT: 34.6 — ABNORMAL LOW
Hemoglobin: 11.6 — ABNORMAL LOW
Hemoglobin: 11.7 — ABNORMAL LOW
MCHC: 33.7
MCHC: 34.4
MCV: 90.4
Platelets: 339
RBC: 3.83 — ABNORMAL LOW
RDW: 16.3 — ABNORMAL HIGH

## 2011-02-18 LAB — BASIC METABOLIC PANEL
BUN: 19
CO2: 25
CO2: 27
Calcium: 9.1
Chloride: 108
Creatinine, Ser: 1.12
GFR calc Af Amer: 52 — ABNORMAL LOW
Glucose, Bld: 87
Glucose, Bld: 88
Sodium: 140

## 2011-03-02 LAB — CBC
Hemoglobin: 9.4 — ABNORMAL LOW
Hemoglobin: 9.4 — ABNORMAL LOW
MCHC: 33.6
MCHC: 34.3
MCHC: 34.7
MCV: 88.5
RBC: 3.08 — ABNORMAL LOW
RBC: 3.22 — ABNORMAL LOW
RDW: 12.5
WBC: 10.2

## 2011-03-02 LAB — GLUCOSE, CAPILLARY
Glucose-Capillary: 126 — ABNORMAL HIGH
Glucose-Capillary: 135 — ABNORMAL HIGH
Glucose-Capillary: 138 — ABNORMAL HIGH
Glucose-Capillary: 62 — ABNORMAL LOW
Glucose-Capillary: 81

## 2011-03-02 LAB — COMPREHENSIVE METABOLIC PANEL
ALT: 30
AST: 28
BUN: 25 — ABNORMAL HIGH
CO2: 24
CO2: 24
Calcium: 8.8
Calcium: 9
Creatinine, Ser: 1.14
GFR calc Af Amer: 48 — ABNORMAL LOW
GFR calc Af Amer: 55 — ABNORMAL LOW
GFR calc non Af Amer: 39 — ABNORMAL LOW
GFR calc non Af Amer: 45 — ABNORMAL LOW
Glucose, Bld: 86
Sodium: 136

## 2011-03-02 LAB — DIFFERENTIAL
Basophils Relative: 0
Eosinophils Absolute: 0.3
Eosinophils Relative: 3
Monocytes Relative: 9
Neutrophils Relative %: 73

## 2011-03-02 LAB — C-REACTIVE PROTEIN: CRP: 8.6 — ABNORMAL HIGH (ref <0.6)

## 2011-03-02 LAB — BASIC METABOLIC PANEL
CO2: 25
Calcium: 9
Creatinine, Ser: 0.97
Glucose, Bld: 59 — ABNORMAL LOW

## 2011-03-03 LAB — GLUCOSE, CAPILLARY
Glucose-Capillary: 112 — ABNORMAL HIGH
Glucose-Capillary: 139 — ABNORMAL HIGH
Glucose-Capillary: 198 — ABNORMAL HIGH
Glucose-Capillary: 205 — ABNORMAL HIGH
Glucose-Capillary: 76
Glucose-Capillary: 78
Glucose-Capillary: 89
Glucose-Capillary: 95

## 2011-03-03 LAB — VANCOMYCIN, TROUGH: Vancomycin Tr: 22.6 — ABNORMAL HIGH

## 2011-03-03 LAB — CBC
HCT: 27.6 — ABNORMAL LOW
HCT: 35.3 — ABNORMAL LOW
Hemoglobin: 11.7 — ABNORMAL LOW
Hemoglobin: 12.3
Hemoglobin: 9 — ABNORMAL LOW
MCHC: 33.2
MCHC: 33.4
MCHC: 34.5
MCV: 88.3
MCV: 88.3
MCV: 88.5
Platelets: 301
RBC: 4
RBC: 4.17
RDW: 12.3
RDW: 12.4
RDW: 12.8
WBC: 10.4

## 2011-03-03 LAB — BASIC METABOLIC PANEL
BUN: 16
BUN: 19
CO2: 26
CO2: 26
CO2: 30
Calcium: 9.3
Chloride: 105
Chloride: 107
Chloride: 109
Creatinine, Ser: 1.1
GFR calc Af Amer: 57 — ABNORMAL LOW
GFR calc Af Amer: >60
Glucose, Bld: 68 — ABNORMAL LOW
Glucose, Bld: 95
Potassium: 4.5
Potassium: 4.8
Sodium: 139
Sodium: 141

## 2011-03-03 LAB — COMPREHENSIVE METABOLIC PANEL
ALT: 30
ALT: 38 — ABNORMAL HIGH
AST: 26
AST: 28
Albumin: 2.7 — ABNORMAL LOW
Alkaline Phosphatase: 81
CO2: 29
Calcium: 9
Chloride: 104
GFR calc non Af Amer: 44 — ABNORMAL LOW
Glucose, Bld: 88
Glucose, Bld: 96
Potassium: 4.1
Sodium: 141
Sodium: 141
Total Bilirubin: 0.9
Total Protein: 5.7 — ABNORMAL LOW

## 2011-03-03 LAB — HEMOGLOBIN AND HEMATOCRIT, BLOOD
HCT: 37.7
Hemoglobin: 12.5

## 2011-03-03 LAB — CROSSMATCH
ABO/RH(D): AB POS
Antibody Screen: POSITIVE
DAT, IgG: POSITIVE
Donor AG Type: NEGATIVE

## 2011-03-03 LAB — DIFFERENTIAL
Basophils Absolute: 0
Basophils Relative: 0
Eosinophils Relative: 2
Lymphocytes Relative: 16
Monocytes Absolute: 1

## 2011-03-03 LAB — C-REACTIVE PROTEIN
CRP: 6.1 — ABNORMAL HIGH (ref <0.6)
CRP: 6.5 — ABNORMAL HIGH (ref <0.6)

## 2011-03-03 LAB — PROTIME-INR
INR: 1
Prothrombin Time: 12.9

## 2011-03-06 LAB — CROSSMATCH
ABO/RH(D): AB POS
Antibody Screen: POSITIVE
DAT, IgG: NEGATIVE
Donor AG Type: NEGATIVE
Donor AG Type: NEGATIVE
Donor AG Type: NEGATIVE
Donor AG Type: NEGATIVE
Donor AG Type: NEGATIVE

## 2011-03-06 LAB — CBC
HCT: 22.3 — ABNORMAL LOW
HCT: 25.6 — ABNORMAL LOW
HCT: 28.6 — ABNORMAL LOW
Hemoglobin: 7.2 — CL
Hemoglobin: 8.7 — ABNORMAL LOW
Hemoglobin: 9.8 — ABNORMAL LOW
MCHC: 32.4
MCHC: 34.1
MCV: 90
Platelets: 254
RBC: 2.48 — ABNORMAL LOW
RBC: 3.18 — ABNORMAL LOW
RDW: 18.1 — ABNORMAL HIGH
WBC: 10.4
WBC: 8.6

## 2011-03-06 LAB — CARDIAC PANEL(CRET KIN+CKTOT+MB+TROPI)
CK, MB: 1.5
CK, MB: 2.1
Relative Index: INVALID
Relative Index: INVALID
Total CK: 28
Total CK: 37
Troponin I: 0.02
Troponin I: 0.03

## 2011-03-06 LAB — PROTIME-INR
INR: 0.9
Prothrombin Time: 12.5

## 2011-03-06 LAB — IRON AND TIBC
Iron: 34 — ABNORMAL LOW
TIBC: 272

## 2011-03-06 LAB — COMPREHENSIVE METABOLIC PANEL
ALT: 25
Alkaline Phosphatase: 61
BUN: 43 — ABNORMAL HIGH
CO2: 22
Calcium: 9.2
GFR calc non Af Amer: 40 — ABNORMAL LOW
Glucose, Bld: 270 — ABNORMAL HIGH
Sodium: 132 — ABNORMAL LOW
Total Protein: 6.2

## 2011-03-06 LAB — BASIC METABOLIC PANEL
BUN: 36 — ABNORMAL HIGH
CO2: 23
Chloride: 107
Chloride: 107
Creatinine, Ser: 0.87
GFR calc Af Amer: >60
Glucose, Bld: 62 — ABNORMAL LOW
Potassium: 4
Potassium: 4.2

## 2011-03-06 LAB — DIFFERENTIAL
Basophils Relative: 1
Eosinophils Absolute: 0
Lymphs Abs: 1.5
Neutro Abs: 10.2 — ABNORMAL HIGH
Neutrophils Relative %: 82 — ABNORMAL HIGH

## 2011-03-06 LAB — SAMPLE TO BLOOD BANK

## 2011-03-06 LAB — HEMOGLOBIN AND HEMATOCRIT, BLOOD
HCT: 26.6 — ABNORMAL LOW
HCT: 35.6 — ABNORMAL LOW
Hemoglobin: 12.1
Hemoglobin: 9.1 — ABNORMAL LOW

## 2011-03-06 LAB — B-NATRIURETIC PEPTIDE (CONVERTED LAB): Pro B Natriuretic peptide (BNP): 135 — ABNORMAL HIGH

## 2011-03-06 LAB — VITAMIN B12: Vitamin B-12: 402 (ref 211–911)

## 2011-03-06 LAB — PREPARE RBC (CROSSMATCH)

## 2011-03-10 LAB — CK TOTAL AND CKMB (NOT AT ARMC)
CK, MB: 1.7
Relative Index: INVALID
Total CK: 36

## 2011-03-10 LAB — CBC
HCT: 20.5 — ABNORMAL LOW
HCT: 23.9 — ABNORMAL LOW
HCT: 33.5 — ABNORMAL LOW
Hemoglobin: 7.1 — CL
MCHC: 34
MCHC: 34.5
MCV: 85.4
MCV: 88.3
MCV: 89.5
Platelets: 185
Platelets: 194
RBC: 2.37 — ABNORMAL LOW
RBC: 2.67 — ABNORMAL LOW
RBC: 3 — ABNORMAL LOW
RDW: 16.9 — ABNORMAL HIGH
WBC: 11 — ABNORMAL HIGH
WBC: 12.1 — ABNORMAL HIGH
WBC: 13.3 — ABNORMAL HIGH

## 2011-03-10 LAB — TYPE AND SCREEN
ABO/RH(D): AB POS
DAT, IgG: NEGATIVE
Donor AG Type: NEGATIVE
Donor AG Type: NEGATIVE

## 2011-03-10 LAB — BASIC METABOLIC PANEL WITH GFR
BUN: 24 — ABNORMAL HIGH
CO2: 25
Calcium: 8.4
Chloride: 109
Creatinine, Ser: 0.89
GFR calc non Af Amer: >60
Glucose, Bld: 81
Potassium: 3.8
Sodium: 140

## 2011-03-10 LAB — HEMOGLOBIN AND HEMATOCRIT, BLOOD
HCT: 19 — ABNORMAL LOW
HCT: 25.5 — ABNORMAL LOW
HCT: 26.6 — ABNORMAL LOW
Hemoglobin: 6.5 — CL
Hemoglobin: 8.8 — ABNORMAL LOW
Hemoglobin: 9 — ABNORMAL LOW

## 2011-03-10 LAB — CARDIAC PANEL(CRET KIN+CKTOT+MB+TROPI)
CK, MB: 1.3
CK, MB: 1.4
Relative Index: INVALID
Relative Index: INVALID
Relative Index: INVALID
Total CK: 30
Total CK: 35
Total CK: 53
Troponin I: 0.02

## 2011-03-10 LAB — URINE CULTURE
Colony Count: 8000
Special Requests: NEGATIVE

## 2011-03-10 LAB — URINALYSIS, ROUTINE W REFLEX MICROSCOPIC
Bilirubin Urine: NEGATIVE
Glucose, UA: NEGATIVE
Hgb urine dipstick: NEGATIVE
Ketones, ur: NEGATIVE
Protein, ur: NEGATIVE
pH: 5

## 2011-03-10 LAB — COMPREHENSIVE METABOLIC PANEL
ALT: 17
Alkaline Phosphatase: 49
BUN: 96 — ABNORMAL HIGH
CO2: 24
Chloride: 104
Glucose, Bld: 416 — ABNORMAL HIGH
Potassium: 5.5 — ABNORMAL HIGH
Sodium: 136
Total Bilirubin: 0.3
Total Protein: 5.5 — ABNORMAL LOW

## 2011-03-10 LAB — BASIC METABOLIC PANEL
BUN: 51 — ABNORMAL HIGH
CO2: 25
Chloride: 113 — ABNORMAL HIGH
GFR calc Af Amer: 60 — ABNORMAL LOW
Potassium: 4.5

## 2011-03-10 LAB — DIFFERENTIAL
Basophils Absolute: 0
Basophils Relative: 0
Eosinophils Absolute: 0
Monocytes Relative: 3
Neutro Abs: 11.6 — ABNORMAL HIGH
Neutrophils Relative %: 87 — ABNORMAL HIGH

## 2011-03-10 LAB — URINE MICROSCOPIC-ADD ON

## 2011-03-10 LAB — CULTURE, BLOOD (ROUTINE X 2)
Culture: NO GROWTH
Culture: NO GROWTH

## 2011-03-10 LAB — HEMOGLOBIN A1C
Hgb A1c MFr Bld: 6.5 — ABNORMAL HIGH
Mean Plasma Glucose: 154

## 2011-03-10 LAB — PROTIME-INR: INR: 1

## 2011-03-10 LAB — TSH: TSH: 1.894

## 2011-03-10 LAB — APTT: aPTT: 33

## 2011-07-30 ENCOUNTER — Encounter (HOSPITAL_COMMUNITY): Payer: Self-pay | Admitting: *Deleted

## 2011-07-30 ENCOUNTER — Other Ambulatory Visit: Payer: Self-pay

## 2011-07-30 ENCOUNTER — Emergency Department (HOSPITAL_COMMUNITY): Payer: Medicare Other

## 2011-07-30 ENCOUNTER — Inpatient Hospital Stay (HOSPITAL_COMMUNITY): Payer: Medicare Other

## 2011-07-30 ENCOUNTER — Inpatient Hospital Stay (HOSPITAL_COMMUNITY)
Admission: EM | Admit: 2011-07-30 | Discharge: 2011-08-06 | DRG: 308 | Disposition: A | Discharge status: Home Health | Payer: Medicare Other | Source: Ambulatory Visit | Attending: Internal Medicine | Admitting: Internal Medicine

## 2011-07-30 DIAGNOSIS — B952 Enterococcus as the cause of diseases classified elsewhere: Secondary | ICD-10-CM | POA: Diagnosis present

## 2011-07-30 DIAGNOSIS — I959 Hypotension, unspecified: Secondary | ICD-10-CM | POA: Diagnosis present

## 2011-07-30 DIAGNOSIS — Z66 Do not resuscitate: Secondary | ICD-10-CM | POA: Diagnosis present

## 2011-07-30 DIAGNOSIS — E785 Hyperlipidemia, unspecified: Secondary | ICD-10-CM | POA: Diagnosis present

## 2011-07-30 DIAGNOSIS — G629 Polyneuropathy, unspecified: Secondary | ICD-10-CM | POA: Diagnosis present

## 2011-07-30 DIAGNOSIS — N390 Urinary tract infection, site not specified: Secondary | ICD-10-CM | POA: Diagnosis present

## 2011-07-30 DIAGNOSIS — F411 Generalized anxiety disorder: Secondary | ICD-10-CM | POA: Diagnosis present

## 2011-07-30 DIAGNOSIS — E876 Hypokalemia: Secondary | ICD-10-CM | POA: Diagnosis not present

## 2011-07-30 DIAGNOSIS — N179 Acute kidney failure, unspecified: Secondary | ICD-10-CM | POA: Diagnosis not present

## 2011-07-30 DIAGNOSIS — I5031 Acute diastolic (congestive) heart failure: Secondary | ICD-10-CM | POA: Diagnosis present

## 2011-07-30 DIAGNOSIS — I9589 Other hypotension: Secondary | ICD-10-CM | POA: Diagnosis present

## 2011-07-30 DIAGNOSIS — I10 Essential (primary) hypertension: Secondary | ICD-10-CM | POA: Diagnosis present

## 2011-07-30 DIAGNOSIS — I4891 Unspecified atrial fibrillation: Principal | ICD-10-CM | POA: Diagnosis present

## 2011-07-30 DIAGNOSIS — K5900 Constipation, unspecified: Secondary | ICD-10-CM | POA: Diagnosis not present

## 2011-07-30 DIAGNOSIS — I279 Pulmonary heart disease, unspecified: Secondary | ICD-10-CM | POA: Diagnosis present

## 2011-07-30 DIAGNOSIS — D72829 Elevated white blood cell count, unspecified: Secondary | ICD-10-CM | POA: Diagnosis not present

## 2011-07-30 DIAGNOSIS — Z85118 Personal history of other malignant neoplasm of bronchus and lung: Secondary | ICD-10-CM

## 2011-07-30 DIAGNOSIS — I509 Heart failure, unspecified: Secondary | ICD-10-CM | POA: Diagnosis present

## 2011-07-30 DIAGNOSIS — J441 Chronic obstructive pulmonary disease with (acute) exacerbation: Secondary | ICD-10-CM | POA: Diagnosis present

## 2011-07-30 DIAGNOSIS — Z87891 Personal history of nicotine dependence: Secondary | ICD-10-CM

## 2011-07-30 DIAGNOSIS — K219 Gastro-esophageal reflux disease without esophagitis: Secondary | ICD-10-CM | POA: Diagnosis present

## 2011-07-30 DIAGNOSIS — T46905A Adverse effect of unspecified agents primarily affecting the cardiovascular system, initial encounter: Secondary | ICD-10-CM | POA: Diagnosis present

## 2011-07-30 DIAGNOSIS — D509 Iron deficiency anemia, unspecified: Secondary | ICD-10-CM | POA: Diagnosis not present

## 2011-07-30 DIAGNOSIS — T380X5A Adverse effect of glucocorticoids and synthetic analogues, initial encounter: Secondary | ICD-10-CM | POA: Diagnosis not present

## 2011-07-30 DIAGNOSIS — E119 Type 2 diabetes mellitus without complications: Secondary | ICD-10-CM | POA: Diagnosis present

## 2011-07-30 DIAGNOSIS — E1149 Type 2 diabetes mellitus with other diabetic neurological complication: Secondary | ICD-10-CM | POA: Diagnosis present

## 2011-07-30 DIAGNOSIS — Z79899 Other long term (current) drug therapy: Secondary | ICD-10-CM

## 2011-07-30 DIAGNOSIS — G609 Hereditary and idiopathic neuropathy, unspecified: Secondary | ICD-10-CM | POA: Diagnosis present

## 2011-07-30 HISTORY — DX: Essential (primary) hypertension: I10

## 2011-07-30 HISTORY — DX: Gastro-esophageal reflux disease without esophagitis: K21.9

## 2011-07-30 HISTORY — DX: Shortness of breath: R06.02

## 2011-07-30 HISTORY — DX: Type 2 diabetes mellitus with diabetic neuropathic arthropathy: E11.610

## 2011-07-30 HISTORY — DX: Hyperlipidemia, unspecified: E78.5

## 2011-07-30 HISTORY — DX: Polyneuropathy, unspecified: G62.9

## 2011-07-30 HISTORY — DX: Unspecified atrial fibrillation: I48.91

## 2011-07-30 HISTORY — DX: Angiodysplasia of stomach and duodenum without bleeding: K31.819

## 2011-07-30 LAB — CARDIAC PANEL(CRET KIN+CKTOT+MB+TROPI)
CK, MB: 3.9 ng/mL (ref 0.3–4.0)
CK, MB: 5.2 ng/mL — ABNORMAL HIGH (ref 0.3–4.0)
Relative Index: INVALID (ref 0.0–2.5)
Total CK: 110 U/L (ref 7–177)
Troponin I: <0.3 ng/mL (ref <0.30)
Troponin I: <0.3 ng/mL (ref <0.30)

## 2011-07-30 LAB — URINALYSIS, ROUTINE W REFLEX MICROSCOPIC
Hgb urine dipstick: NEGATIVE
Nitrite: NEGATIVE
Protein, ur: 100 mg/dL — AB
Urobilinogen, UA: 0.2 mg/dL (ref 0.0–1.0)

## 2011-07-30 LAB — CBC
MCV: 87.3 fL (ref 78.0–100.0)
Platelets: 185 10*3/uL (ref 150–400)
RBC: 4.33 MIL/uL (ref 3.87–5.11)
WBC: 10.2 10*3/uL (ref 4.0–10.5)

## 2011-07-30 LAB — PRO B NATRIURETIC PEPTIDE: Pro B Natriuretic peptide (BNP): 2891 pg/mL — ABNORMAL HIGH (ref 0–450)

## 2011-07-30 LAB — MRSA PCR SCREENING: MRSA by PCR: NEGATIVE

## 2011-07-30 LAB — TROPONIN I: Troponin I: <0.3 ng/mL (ref <0.30)

## 2011-07-30 LAB — BASIC METABOLIC PANEL
CO2: 27 mEq/L (ref 19–32)
Glucose, Bld: 172 mg/dL — ABNORMAL HIGH (ref 70–99)
Potassium: 3.6 mEq/L (ref 3.5–5.1)
Sodium: 135 mEq/L (ref 135–145)

## 2011-07-30 LAB — DIFFERENTIAL
Eosinophils Relative: 0 % (ref 0–5)
Lymphocytes Relative: 32 % (ref 12–46)
Lymphs Abs: 3.3 10*3/uL (ref 0.7–4.0)
Neutrophils Relative %: 58 % (ref 43–77)

## 2011-07-30 LAB — URINE MICROSCOPIC-ADD ON

## 2011-07-30 LAB — HEMOGLOBIN A1C: Mean Plasma Glucose: 148 mg/dL — ABNORMAL HIGH (ref <117)

## 2011-07-30 LAB — MAGNESIUM: Magnesium: 1.6 mg/dL (ref 1.5–2.5)

## 2011-07-30 LAB — TSH: TSH: 0.542 u[IU]/mL (ref 0.350–4.500)

## 2011-07-30 LAB — GLUCOSE, CAPILLARY

## 2011-07-30 MED ORDER — GUAIFENESIN-DM 100-10 MG/5ML PO SYRP
5.0000 mL | ORAL_SOLUTION | ORAL | Status: DC | PRN
  Filled 2011-07-30: qty 5

## 2011-07-30 MED ORDER — NITROGLYCERIN 2 % TD OINT: 1.0000 [in_us] | TOPICAL_OINTMENT | Freq: Once | TRANSDERMAL | Status: DC

## 2011-07-30 MED ORDER — ACETAMINOPHEN 650 MG RE SUPP: 650.0000 mg | Freq: Four times a day (QID) | RECTAL | Status: DC | PRN

## 2011-07-30 MED ORDER — TECHNETIUM TO 99M ALBUMIN AGGREGATED
3.0000 | Freq: Once | INTRAVENOUS | Status: AC | PRN
  Administered 2011-07-30: 3 via INTRAVENOUS

## 2011-07-30 MED ORDER — SODIUM CHLORIDE 0.9 % IJ SOLN
3.0000 mL | Freq: Two times a day (BID) | INTRAMUSCULAR | Status: DC
  Administered 2011-07-30 – 2011-08-06 (×15): 3 mL via INTRAVENOUS

## 2011-07-30 MED ORDER — DIGOXIN 0.25 MG/ML IJ SOLN: 0.5000 mg | Freq: Once | INTRAMUSCULAR | Status: DC

## 2011-07-30 MED ORDER — ZOLPIDEM TARTRATE 5 MG PO TABS
5.0000 mg | ORAL_TABLET | Freq: Every evening | ORAL | Status: DC | PRN
  Administered 2011-07-30 – 2011-08-05 (×7): 5 mg via ORAL
  Filled 2011-07-30 (×7): qty 1

## 2011-07-30 MED ORDER — ADULT MULTIVITAMIN W/MINERALS CH
1.0000 | ORAL_TABLET | Freq: Every day | ORAL | Status: DC
  Administered 2011-07-30 – 2011-08-06 (×8): 1 via ORAL
  Filled 2011-07-30 (×8): qty 1

## 2011-07-30 MED ORDER — ONDANSETRON HCL 4 MG/2ML IJ SOLN
4.0000 mg | Freq: Once | INTRAMUSCULAR | Status: AC
  Administered 2011-07-30: 4 mg via INTRAVENOUS
  Filled 2011-07-30: qty 2

## 2011-07-30 MED ORDER — INSULIN ASPART 100 UNIT/ML ~~LOC~~ SOLN: 0.0000 [IU] | Freq: Three times a day (TID) | SUBCUTANEOUS | Status: DC

## 2011-07-30 MED ORDER — ACETAMINOPHEN 325 MG PO TABS
650.0000 mg | ORAL_TABLET | Freq: Four times a day (QID) | ORAL | Status: DC | PRN
  Administered 2011-07-31 – 2011-08-02 (×2): 650 mg via ORAL
  Filled 2011-07-30 (×2): qty 2

## 2011-07-30 MED ORDER — SODIUM CHLORIDE 0.9 % IV SOLN
INTRAVENOUS | Status: DC
  Administered 2011-07-30: 06:00:00 via INTRAVENOUS

## 2011-07-30 MED ORDER — DILTIAZEM HCL 100 MG IV SOLR
5.0000 mg/h | Freq: Once | INTRAVENOUS | Status: DC
  Administered 2011-07-30: 10 mg/h via INTRAVENOUS

## 2011-07-30 MED ORDER — MOXIFLOXACIN HCL IN NACL 400 MG/250ML IV SOLN
400.0000 mg | INTRAVENOUS | Status: DC
  Administered 2011-07-30 – 2011-08-02 (×4): 400 mg via INTRAVENOUS
  Filled 2011-07-30 (×5): qty 250

## 2011-07-30 MED ORDER — SODIUM CHLORIDE 0.9 % IJ SOLN: 3.0000 mL | INTRAMUSCULAR | Status: DC | PRN

## 2011-07-30 MED ORDER — VITAMIN D-3 125 MCG (5000 UT) PO TABS: 1.0000 | ORAL_TABLET | ORAL | Status: DC

## 2011-07-30 MED ORDER — NITROGLYCERIN 2 % TD OINT
0.5000 [in_us] | TOPICAL_OINTMENT | Freq: Once | TRANSDERMAL | Status: DC
  Filled 2011-07-30: qty 1

## 2011-07-30 MED ORDER — ALBUTEROL SULFATE (5 MG/ML) 0.5% IN NEBU
INHALATION_SOLUTION | RESPIRATORY_TRACT | Status: AC
  Administered 2011-07-30: 06:00:00
  Filled 2011-07-30: qty 2

## 2011-07-30 MED ORDER — SODIUM CHLORIDE 0.9 % IV SOLN: 250.0000 mL | INTRAVENOUS | Status: DC | PRN

## 2011-07-30 MED ORDER — FUROSEMIDE 20 MG PO TABS
20.0000 mg | ORAL_TABLET | Freq: Once | ORAL | Status: AC
  Administered 2011-07-30: 20 mg via ORAL
  Filled 2011-07-30: qty 1

## 2011-07-30 MED ORDER — LEVALBUTEROL HCL 0.63 MG/3ML IN NEBU
0.6300 mg | INHALATION_SOLUTION | Freq: Four times a day (QID) | RESPIRATORY_TRACT | Status: DC
  Administered 2011-07-30 – 2011-07-31 (×7): 0.63 mg via RESPIRATORY_TRACT
  Filled 2011-07-30 (×14): qty 3

## 2011-07-30 MED ORDER — METHYLPREDNISOLONE SODIUM SUCC 125 MG IJ SOLR
60.0000 mg | Freq: Four times a day (QID) | INTRAMUSCULAR | Status: DC
  Administered 2011-07-30 – 2011-07-31 (×4): 60 mg via INTRAVENOUS
  Filled 2011-07-30 (×8): qty 0.96

## 2011-07-30 MED ORDER — INSULIN ASPART 100 UNIT/ML ~~LOC~~ SOLN
0.0000 [IU] | Freq: Three times a day (TID) | SUBCUTANEOUS | Status: DC
  Administered 2011-07-31: 7 [IU] via SUBCUTANEOUS
  Administered 2011-07-31: 4 [IU] via SUBCUTANEOUS
  Administered 2011-07-31: 15 [IU] via SUBCUTANEOUS
  Administered 2011-08-01: 11 [IU] via SUBCUTANEOUS
  Administered 2011-08-01: 3 [IU] via SUBCUTANEOUS
  Administered 2011-08-01: 4 [IU] via SUBCUTANEOUS

## 2011-07-30 MED ORDER — LEVALBUTEROL HCL 0.63 MG/3ML IN NEBU
0.6300 mg | INHALATION_SOLUTION | RESPIRATORY_TRACT | Status: AC
  Administered 2011-07-30: 0.63 mg via RESPIRATORY_TRACT
  Filled 2011-07-30: qty 3

## 2011-07-30 MED ORDER — BUDESONIDE 0.25 MG/2ML IN SUSP
0.2500 mg | Freq: Two times a day (BID) | RESPIRATORY_TRACT | Status: DC
  Administered 2011-07-31 – 2011-08-06 (×13): 0.25 mg via RESPIRATORY_TRACT
  Filled 2011-07-30 (×16): qty 2

## 2011-07-30 MED ORDER — VITAMIN D3 25 MCG (1000 UNIT) PO TABS
1000.0000 [IU] | ORAL_TABLET | ORAL | Status: DC
  Administered 2011-07-30 – 2011-08-05 (×4): 1000 [IU] via ORAL
  Filled 2011-07-30 (×4): qty 1

## 2011-07-30 MED ORDER — INSULIN ASPART 100 UNIT/ML ~~LOC~~ SOLN
0.0000 [IU] | Freq: Every day | SUBCUTANEOUS | Status: DC
  Administered 2011-07-30: 2 [IU] via SUBCUTANEOUS
  Administered 2011-08-02: 4 [IU] via SUBCUTANEOUS
  Administered 2011-08-04: 2 [IU] via SUBCUTANEOUS

## 2011-07-30 MED ORDER — TRAMADOL HCL 50 MG PO TABS
50.0000 mg | ORAL_TABLET | Freq: Three times a day (TID) | ORAL | Status: DC | PRN
  Administered 2011-07-30 – 2011-08-01 (×2): 50 mg via ORAL
  Filled 2011-07-30 (×3): qty 1

## 2011-07-30 MED ORDER — IPRATROPIUM BROMIDE 0.02 % IN SOLN
RESPIRATORY_TRACT | Status: AC
  Administered 2011-07-30: 06:00:00
  Filled 2011-07-30: qty 2.5

## 2011-07-30 MED ORDER — METHYLPREDNISOLONE SODIUM SUCC 125 MG IJ SOLR
INTRAMUSCULAR | Status: AC
  Administered 2011-07-30: 06:00:00
  Filled 2011-07-30: qty 2

## 2011-07-30 MED ORDER — PHENOL 1.4 % MT LIQD
1.0000 | OROMUCOSAL | Status: DC | PRN
  Administered 2011-07-30 – 2011-08-01 (×2): 1 via OROMUCOSAL
  Filled 2011-07-30: qty 177

## 2011-07-30 MED ORDER — INSULIN ASPART 100 UNIT/ML ~~LOC~~ SOLN
20.0000 [IU] | Freq: Once | SUBCUTANEOUS | Status: AC
  Administered 2011-07-30: 20 [IU] via SUBCUTANEOUS
  Filled 2011-07-30: qty 3

## 2011-07-30 MED ORDER — FUROSEMIDE 10 MG/ML IJ SOLN
40.0000 mg | Freq: Once | INTRAMUSCULAR | Status: DC
  Filled 2011-07-30: qty 4

## 2011-07-30 MED ORDER — DILTIAZEM HCL 100 MG IV SOLR
5.0000 mg/h | INTRAVENOUS | Status: DC
  Administered 2011-07-31: 5 mg/h via INTRAVENOUS
  Filled 2011-07-30 (×2): qty 100

## 2011-07-30 MED ORDER — MOXIFLOXACIN HCL IN NACL 400 MG/250ML IV SOLN
400.0000 mg | Freq: Once | INTRAVENOUS | Status: DC
Start: 2011-07-30 — End: 2011-07-30
  Filled 2011-07-30: qty 250

## 2011-07-30 MED ORDER — INSULIN GLARGINE 100 UNIT/ML ~~LOC~~ SOLN
5.0000 [IU] | Freq: Every day | SUBCUTANEOUS | Status: DC
  Administered 2011-07-30: 5 [IU] via SUBCUTANEOUS
  Filled 2011-07-30: qty 3

## 2011-07-30 MED ORDER — LEVOTHYROXINE SODIUM 25 MCG PO TABS
25.0000 ug | ORAL_TABLET | Freq: Every day | ORAL | Status: DC
  Administered 2011-07-30 – 2011-07-31 (×2): 25 ug via ORAL
  Filled 2011-07-30 (×3): qty 1

## 2011-07-30 NOTE — ED Provider Notes (Signed)
History     CSN: [id number]  Arrival date & time 07/30/11  0609   First MD Initiated Contact with Patient 07/30/11 [phone]      Chief Complaint  Patient presents with  . Shortness of Breath    Patient is a 78 y.o. female presenting with shortness of breath. The history is provided by the patient, the EMS personnel and the nursing home. History Limited By: urgent need for intervention.  Shortness of Breath  Associated symptoms include shortness of breath.   Pt was seen at 0630.  Per NH report, EMS and pt, c/o gradual onset and worsening of persistent SOB since last night.  EMS notes pt's O2 Sat was "82%" on R/A on their arrival to scene.  EMS gave neb and solumedrol en route with partial improvement.  Pt denies CP, no cough, no abd pain, no N/V/D, no fevers.    PMD:  Linnell Fulling Past Medical History  Diagnosis Date  . Diabetes mellitus   . Hypertension   . Atrial fibrillation   . Hyperlipidemia   . Peripheral neuropathy   . Gastric AVM   . GERD (gastroesophageal reflux disease)   . Diabetic Charcot foot     Past Surgical History  Procedure Date  . Tonsillectomy   . Lung removal, partial   . Thyroidectomy, partial      History  Substance Use Topics  . Smoking status: Former Games developer  . Smokeless tobacco: Not on file  . Alcohol Use: No    Review of Systems  Unable to perform ROS: Other  Respiratory: Positive for shortness of breath.     Allergies  Aspirin; Indapamide; Metformin and related; Penicillins; Spiriva handihaler; and Sulfa antibiotics  Home Medications   Current Outpatient Rx  Name Route Sig Dispense Refill  . BISOPROLOL FUMARATE 5 MG PO TABS Oral Take 5 mg by mouth daily.    Marland Kitchen VITAMIN D-3 5000 UNITS PO TABS Oral Take 1 tablet by mouth every other day.    . GLYBURIDE 5 MG PO TABS Oral Take 10 mg by mouth 2 (two) times daily with a meal.    . LEVOTHYROXINE SODIUM 25 MCG PO TABS Oral Take 25 mcg by mouth daily.    . ADULT MULTIVITAMIN W/MINERALS CH  Oral Take 1 tablet by mouth daily.    Marland Kitchen OLMESARTAN MEDOXOMIL 40 MG PO TABS Oral Take 40 mg by mouth daily.    . TRAMADOL HCL 50 MG PO TABS Oral Take 50 mg by mouth every 8 (eight) hours as needed. pain    . ZOLPIDEM TARTRATE 10 MG PO TABS Oral Take 5-10 mg by mouth at bedtime as needed. sleep      BP 113/53  Pulse 125  Resp 30  SpO2 96%  Physical Exam 0635: Physical examination:  Nursing notes reviewed; Vital signs and O2 SAT reviewed;  Constitutional: Well developed, Well nourished, In no acute distress; Head:  Normocephalic, atraumatic; Eyes: EOMI, PERRL, No scleral icterus; ENMT: Mouth and pharynx normal, Mucous membranes dry; Neck: Supple, Full range of motion, No lymphadenopathy; Cardiovascular: Irregular irregular rate and rhythm, No murmur or gallop; Respiratory: Breath sounds coarse & equal bilaterally with scattered insp/exp wheezes. Normal respiratory effort/excursion; Chest: Nontender, Movement normal; Abdomen: Soft, Nontender, Nondistended, Normal bowel sounds; Extremities: Pulses normal, No tenderness, No edema, No calf edema or asymmetry.; Neuro: AA&Ox3, Major CN grossly intact.  No gross focal motor or sensory deficits in extremities.; Skin: Color normal, Warm, Dry   ED Course  Procedures  9562:  Pt already given albuterol 10mg /atroven 1mg  en route with IV solumedrol.  HR now afib 150's, will continue to monitor.  0700:  Pt's HR now 170's, afib with RVR on monitor.  Pt continues to deny CP.  Lungs coarse but no further wheezing.  Will start IV cardizem bolus and gtt.  Labs pending.   8:11 AM:   Pt continues on cardizem gtt, HR 120.  BNP elevated from previous, ntg and lasix ordered.  Dx testing d/w pt.  Questions answered.  Verb understanding, agreeable to admit.  T/C to [facility], case discussed, including:  HPI, pertinent PM/SHx, VS/PE, dx testing, ED course and treatment:  Agreeable to admit, requests to obtain step down bed to team 7/Dr. Isidoro Donning.   MDM  MDM Reviewed:  nursing note and vitals Reviewed previous: ECG Interpretation: ECG, labs and x-ray Total time providing critical care: 30-74 minutes. This excludes time spent performing separately reportable procedures and services.   CRITICAL CARE Performed by: Laray Anger Total critical care time: 31 Critical care time was exclusive of separately billable procedures and treating other patients. Critical care was necessary to treat or prevent imminent or life-threatening deterioration. Critical care was time spent personally by me on the following activities: development of treatment plan with patient and/or surrogate as well as nursing, discussions with consultants, evaluation of patient's response to treatment, examination of patient, obtaining history from patient or surrogate, ordering and performing treatments and interventions, ordering and review of laboratory studies, ordering and review of radiographic studies, pulse oximetry and re-evaluation of patient's condition.   Date: 07/30/2011  Rate: 149  Rhythm: atrial fibrillation  QRS Axis: normal  Intervals: normal  ST/T Wave abnormalities: normal  Conduction Disutrbances:none  Narrative Interpretation:   Old EKG Reviewed: unchanged, rate faster on today's EKG compared to previous EKG dated 08/18/2009.   Dg Chest Port 1 View 07/30/2011  *RADIOLOGY REPORT*  Clinical Data: Shortness of breath.  PORTABLE CHEST - 1 VIEW  Comparison: 08/21/2009 and 04/20/2007  Findings: The patient has chronic obstructive lung disease with chronic scarring at the right lung base.  Heart size and vascularity are normal.  No acute abnormalities.  No free air seen under the diaphragm.  No acute osseous abnormalities.  IMPRESSION: Chronic obstructive lung disease.  Scarring at the right lung base. No acute abnormalities.  Original Report Authenticated By: Gwynn Burly, M.D.    Results for orders placed during the hospital encounter of 07/30/11  PRO B NATRIURETIC  PEPTIDE      Component Value Range   Pro B Natriuretic peptide (BNP) 2891.0 (*) 0 - 450 (pg/mL)  TROPONIN I      Component Value Range   Troponin I <0.30  <0.30 (ng/mL)  CBC      Component Value Range   WBC 10.2  4.0 - 10.5 (K/uL)   RBC 4.33  3.87 - 5.11 (MIL/uL)   Hemoglobin 12.5  12.0 - 15.0 (g/dL)   HCT 13.0  86.5 - 78.4 (%)   MCV 87.3  78.0 - 100.0 (fL)   MCH 28.9  26.0 - 34.0 (pg)   MCHC 33.1  30.0 - 36.0 (g/dL)   RDW 69.6  29.5 - 28.4 (%)   Platelets 185  150 - 400 (K/uL)  DIFFERENTIAL      Component Value Range   Neutrophils Relative 58  43 - 77 (%)   Neutro Abs 5.9  1.7 - 7.7 (K/uL)   Lymphocytes Relative 32  12 - 46 (%)  Lymphs Abs 3.3  0.7 - 4.0 (K/uL)   Monocytes Relative 10  3 - 12 (%)   Monocytes Absolute 1.0  0.1 - 1.0 (K/uL)   Eosinophils Relative 0  0 - 5 (%)   Eosinophils Absolute 0.0  0.0 - 0.7 (K/uL)   Basophils Relative 0  0 - 1 (%)   Basophils Absolute 0.0  0.0 - 0.1 (K/uL)  BASIC METABOLIC PANEL      Component Value Range   Sodium 135  135 - 145 (mEq/L)   Potassium 3.6  3.5 - 5.1 (mEq/L)   Chloride 99  96 - 112 (mEq/L)   CO2 27  19 - 32 (mEq/L)   Glucose, Bld 172 (*) 70 - 99 (mg/dL)   BUN 25 (*) 6 - 23 (mg/dL)   Creatinine, Ser 1.61  0.50 - 1.10 (mg/dL)   Calcium 9.6  8.4 - 09.6 (mg/dL)   GFR calc non Af Amer 54 (*) >90 (mL/min)   GFR calc Af Amer 62 (*) >90 (mL/min)    Results for ANASTON, KOEHN (MRN [id number]) as of 07/30/2011 08:05  Ref. Range 08/21/2009 06:20 07/30/2011 06:18  Pro B Natriuretic peptide (BNP) Latest Range: 0-450 pg/mL 169.0 (H) 2891.0 (H)         Laray Anger, DO 07/31/11 [phone]

## 2011-07-30 NOTE — ED Notes (Signed)
Patient made aware of needed urine specimen

## 2011-07-30 NOTE — ED Notes (Signed)
Per EMS - pt from [facility] - pt w/ onset yesterday evening of shortness of breath  - pt w/ hx of partial lobectomy to rt lung. Pt received x2 alb neb tx, 125mg  solu-medrol, and x1 atrovent. Pt w/ 82% pulse on RA on scene

## 2011-07-30 NOTE — H&P (Signed)
History and Physical       Hospital Admission Note Date: 07/30/2011  Patient name: Lori [name] Medical record number: [id number] Date of birth: 03-18-1921 Age: 78 y.o. Gender: female PCP: Thayer Headings, MD, MD  Attending physician: Cathren Harsh, MD   Chief Complaint:  Shortness of breath with wheezing worse over the last 1 week  HPI: Patient is 78 year old female with past medical history of diabetes mellitus, hypertension, atrial fibrillation, hyperlipidemia, history of gastric AVMs and GI bleed in the past, not considered a Coumadin candidate secondary to GI bleed/ gastric AVMs was sent from NH for shortness of breath and wheezing. History was also obtained from the patient who stated that she's been having shortness of breath over the last 2-3 weeks however worsened over the last 1 week with wheezing. She denied any chest pain, diaphoresis or any productive cough, fevers or chills. In the ER patient was also noted to be in rapid A. fib with heart rate of 145 and wheezing in all lung fields. Patient was started on Cardizem drip and hospitalist service was called for admission. At the time of my examination, Patient was somewhat hypotensive with SBP in 70's-80's, heart rate still elevated 105-115 on Cardizem drip   Review of Systems:  Constitutional: Denies fever, chills, diaphoresis, appetite change and fatigue.  HEENT: Denies photophobia, eye pain, redness, hearing loss, ear pain, congestion, sore throat, rhinorrhea, sneezing, mouth sores, trouble swallowing, neck pain, neck stiffness and tinnitus.   Respiratory: See history of present illness   Cardiovascular: See history of present illness  Gastrointestinal: Denies nausea, vomiting, abdominal pain, diarrhea, constipation, blood in stool and abdominal distention.  Genitourinary: Denies dysuria, urgency, frequency, hematuria, flank pain and difficulty urinating.  Musculoskeletal:  Denies myalgias, back pain, joint swelling, arthralgias and gait problem.  Skin: Denies pallor, rash and wound.  Neurological: Denies dizziness, seizures, syncope, weakness, light-headedness, numbness and headaches.  Hematological: Denies adenopathy. Easy bruising, personal or family bleeding history  Psychiatric/Behavioral: Denies suicidal ideation, mood changes, confusion, nervousness, sleep disturbance and agitation  Past Medical History: Past Medical History  Diagnosis Date  . Diabetes mellitus   . Hypertension   . Atrial fibrillation   . Hyperlipidemia   . Peripheral neuropathy   . Gastric AVM   . GERD (gastroesophageal reflux disease)   . Diabetic Charcot foot    Past Surgical History  Procedure Date  . Tonsillectomy   . Lung removal, partial   . Thyroidectomy, partial     Medications: Prior to Admission medications   Medication Sig Start Date End Date Taking? Authorizing Provider  bisoprolol (ZEBETA) 5 MG tablet Take 5 mg by mouth daily.   Yes Historical Provider, MD  Cholecalciferol (VITAMIN D-3) 5000 UNITS TABS Take 1 tablet by mouth every other day.   Yes Historical Provider, MD  glyBURIDE (DIABETA) 5 MG tablet Take 10 mg by mouth 2 (two) times daily with a meal.   Yes Historical Provider, MD  levothyroxine (SYNTHROID, LEVOTHROID) 25 MCG tablet Take 25 mcg by mouth daily.   Yes Historical Provider, MD  Multiple Vitamin (MULITIVITAMIN WITH MINERALS) TABS Take 1 tablet by mouth daily.   Yes Historical Provider, MD  olmesartan (BENICAR) 40 MG tablet Take 40 mg by mouth daily.   Yes Historical Provider, MD  traMADol (ULTRAM) 50 MG tablet Take 50 mg by mouth every 8 (eight) hours as needed. pain   Yes Historical Provider, MD  zolpidem (AMBIEN) 10 MG tablet Take 5-10 mg by mouth at bedtime as needed.  sleep   Yes Historical Provider, MD    Allergies:   Allergies  Allergen Reactions  . Aspirin   . Indapamide   . Metformin And Related   . Penicillins   . Spiriva  Handihaler   . Sulfa Antibiotics     Social History:  reports that she has quit smoking. She does not have any smokeless tobacco history on file. She reports that she does not drink alcohol or use illicit drugs.She's currently resident of SNF  Family History: History reviewed. No pertinent family history.  Physical Exam: Blood pressure 113/53, pulse 108, resp. rate 28, SpO2 98.00%. General: Alert, awake, oriented x3,  HEENT: anicteric sclera, pink conjunctiva, pupils equal and reactive to light and accomodation Neck: supple, no masses or lymphadenopathy, no goiter, no bruits  Heart:  irregularly irregular, tachycardiac  Lungs:  diffuse expiratory wheezing bilaterally in all lung fields  Abdomen: Soft, nontender, nondistended, positive bowel sounds, no masses. Extremities: No clubbing, cyanosis or edema with positive pedal pulses. Neuro: Grossly intact, no focal neurological deficits, strength 5/5 upper and lower extremities bilaterally Psych: alert and oriented x 3, normal mood and affect Skin: no rashes or lesions, warm and dry   LABS on Admission:  Basic Metabolic Panel:  Lab 07/30/11 9147  NA 135  K 3.6  CL 99  CO2 27  GLUCOSE 172*  BUN 25*  CREATININE 0.92  CALCIUM 9.6  MG --  PHOS --   CBC:  Lab 07/30/11 0619  WBC 10.2  NEUTROABS 5.9  HGB 12.5  HCT 37.8  MCV 87.3  PLT 185   Cardiac Enzymes:  Lab 07/30/11 0619  CKTOTAL --  CKMB --  CKMBINDEX --  TROPONINI <0.30     Radiological Exams on Admission: Dg Chest Port 1 View  07/30/2011    IMPRESSION: Chronic obstructive lung disease.  Scarring at the right lung base. No acute abnormalities.  Original Report Authenticated By: Gwynn Burly, M.D.    Assessment/Plan Present on Admission:  .Atrial fibrillation with RVR: Known history of A. fib, triggered by COPD exacerbation - Heart rate still uncontrolled on Cardizem drip, admit to stepdown status   - Obtain cardiac enzymes, BNP, TSH, d-dimer  - 2-D  echo when heart rate is more controlled, goal <100 - Cardiology service consulted, appreciate recommendations - She is not a Coumadin candidate from the previous records secondary to history of gastric AVMs and GI bleed in the past. - D-dimer elevated, ordered VQ scan to rule out acute PE   .Hypotension: Likely secondary to A. fib with RVR and then Cardizem drip  - Titrated Cardizem drip down to 5 mg and BP somewhat stable   .COPD exacerbation - Placed on scheduled Xopenex nebulizers,  patient is allergic to Spiriva  - Placed on Pulmicort, Solu-Medrol, Avelox   .CHF, acute: Likely precipitated scheduled to rapid A. fib  - Appreciate cardiology recommendations, placed on Lasix 20 mg IV as patient is somewhat hypotensive  - BNP, TSH in a.m, 2-D echo when heart rate is more controlled.   .Diabetes mellitus - Obtain HbA1c, place on sliding scale insulin   .Hyperlipidemia - Will obtain lipid panel   .Peripheral neuropathy - Continue tramadol when necessary  DVT prophylaxis: Bilateral SCDs  CODE STATUS: I discussed in detail with the patient, requested to be DO NOT RESUSCITATE  Further plan will depend as patient's clinical course evolves and further radiologic and laboratory data become available.   Point of contact: Candas Deemer, phone # [phone]  @Time   Spent on Admission: 1 hour  Arrietty Dercole M.D. [facility] 07/30/2011, 10:52 AM

## 2011-07-30 NOTE — Progress Notes (Signed)
Went to patient's room and she stated that her ring is missing.  I did not see any ring when she came up to the unit.  I called Governor Specking, patient's son, and informed him that patient claimed that she has a ring but the only valuables she had when she came to the unit is her golden timex watch and her eyeglasses, pink gown and a blue slipper.  Mr. Jyll Tomaro said that his brother might have had the ring.

## 2011-07-30 NOTE — Consult Note (Signed)
Pt. Seen and examined. Agree with the NP/PA-C note as written.  A-fib with RVR, however, doubt causing hypotension. BP improved with fluids. On low dose diltiazem gtts.  Most prominent findings are wheezing and I suspect this is COPD exacerbation + anxiety, mild diastolic heart failure.  Agree to check TSH.  Will follow along with you in a consultative capacity.  Chrystie Nose, MD, [facility] Attending Cardiologist [facility]

## 2011-07-30 NOTE — ED Notes (Signed)
Called RT to come for treatment

## 2011-07-30 NOTE — ED Provider Notes (Signed)
History     CSN: [id number]  Arrival date & time 07/30/11  0609   First MD Initiated Contact with Patient 07/30/11 [phone]      Chief Complaint  Patient presents with  . Shortness of Breath    (Consider location/radiation/quality/duration/timing/severity/associated sxs/prior treatment) Patient is a 78 y.o. female presenting with shortness of breath. The history is provided by the patient. No language interpreter was used.  Shortness of Breath  The current episode started yesterday. The problem occurs continuously. The problem has been gradually worsening. The problem is moderate. The symptoms are relieved by nothing. Associated symptoms include shortness of breath and wheezing. Pertinent negatives include no chest pain, no chest pressure, no fever, no rhinorrhea, no sore throat and no cough. She has had intermittent steroid use. She has had prior hospitalizations.   patient is coming in EMS was in rapid atrial fib 145 and wheezing in all lobes. Past medical history of lung cancer '95 with a lobectomy. She received Solu-Medrol 125 IV in route. She is diabetic with hypertension hyperlipidemia chronic renal failure and a history of atrial fib. She's also had a partial thyroidectomy in the past. Will  Continue breathing treatments as needed and start a Cardizem drip with a 10 mg bolus. Cardiac labs and a chest x-ray.   Past Medical History  Diagnosis Date  . Diabetes mellitus   . Hypertension   . Atrial fibrillation   . Hyperlipidemia   . Chronic renal failure     History reviewed. No pertinent past surgical history.  History reviewed. No pertinent family history.  History  Substance Use Topics  . Smoking status: Not on file  . Smokeless tobacco: Not on file  . Alcohol Use:     OB History    Grav Para Term Preterm Abortions TAB SAB Ect Mult Living                  Review of Systems  Constitutional: Negative for fever.  HENT: Negative for sore throat and rhinorrhea.     Respiratory: Positive for shortness of breath and wheezing. Negative for cough.   Cardiovascular: Negative for chest pain.  Neurological: Negative for dizziness and light-headedness.  Psychiatric/Behavioral: Negative.     Allergies  Aspirin; Indapamide; Metformin and related; Penicillins; Spiriva handihaler; and Sulfa antibiotics  Home Medications   Current Outpatient Rx  Name Route Sig Dispense Refill  . BISOPROLOL FUMARATE 5 MG PO TABS Oral Take 5 mg by mouth daily.    Marland Kitchen VITAMIN D-3 5000 UNITS PO TABS Oral Take 1 tablet by mouth every other day.    . GLYBURIDE 5 MG PO TABS Oral Take 10 mg by mouth 2 (two) times daily with a meal.    . LEVOTHYROXINE SODIUM 25 MCG PO TABS Oral Take 25 mcg by mouth daily.    . ADULT MULTIVITAMIN W/MINERALS CH Oral Take 1 tablet by mouth daily.    Marland Kitchen OLMESARTAN MEDOXOMIL 40 MG PO TABS Oral Take 40 mg by mouth daily.    . TRAMADOL HCL 50 MG PO TABS Oral Take 50 mg by mouth every 8 (eight) hours as needed. pain    . ZOLPIDEM TARTRATE 10 MG PO TABS Oral Take 5-10 mg by mouth at bedtime as needed. sleep      BP 153/82  Pulse 169  Resp 27  SpO2 98%  Physical Exam  Nursing note and vitals reviewed. Constitutional: She is oriented to person, place, and time. She appears well-developed and well-nourished.  HENT:  Head: Normocephalic and atraumatic.  Eyes: Conjunctivae and EOM are normal. Pupils are equal, round, and reactive to light.  Neck: Normal range of motion. Neck supple.  Cardiovascular: Intact distal pulses.  Exam reveals no gallop and no friction rub.   No murmur heard. Pulmonary/Chest: She is in respiratory distress. She has wheezes. She has no rales.  Abdominal: Soft. Bowel sounds are normal. There is no tenderness.  Musculoskeletal: Normal range of motion. She exhibits no edema and no tenderness.  Neurological: She is alert and oriented to person, place, and time. She has normal reflexes.  Skin: Skin is warm and dry.  Psychiatric: She has  a normal mood and affect.    ED Course  Procedures (including critical care time)   Labs Reviewed  PRO B NATRIURETIC PEPTIDE  TROPONIN I  URINALYSIS, ROUTINE W REFLEX MICROSCOPIC  URINE CULTURE  CBC  DIFFERENTIAL  BASIC METABOLIC PANEL   Dg Chest Port 1 View  07/30/2011  *RADIOLOGY REPORT*  Clinical Data: Shortness of breath.  PORTABLE CHEST - 1 VIEW  Comparison: 08/21/2009 and 04/20/2007  Findings: The patient has chronic obstructive lung disease with chronic scarring at the right lung base.  Heart size and vascularity are normal.  No acute abnormalities.  No free air seen under the diaphragm.  No acute osseous abnormalities.  IMPRESSION: Chronic obstructive lung disease.  Scarring at the right lung base. No acute abnormalities.  Original Report Authenticated By: Gwynn Burly, M.D.     No diagnosis found.    MDM   78 yo female with sob, wheezing and afib rate 130's.  Breathing tmts x 3 in the ER.  Cardizem bolus 10mg  with drip then  Rate in the 80's.  BNP 2891.  Admitted for CHF with a-fib and SOB to hospitalist per Dr. Clarene Duke.   Labs Reviewed  PRO B NATRIURETIC PEPTIDE - Abnormal; Notable for the following:    Pro B Natriuretic peptide (BNP) 2891.0 (*)    All other components within normal limits  URINALYSIS, ROUTINE W REFLEX MICROSCOPIC - Abnormal; Notable for the following:    APPearance CLOUDY (*)    Protein, ur 100 (*)    All other components within normal limits  BASIC METABOLIC PANEL - Abnormal; Notable for the following:    Glucose, Bld 172 (*)    BUN 25 (*)    GFR calc non Af Amer 54 (*)    GFR calc Af Amer 62 (*)    All other components within normal limits  D-DIMER, QUANTITATIVE - Abnormal; Notable for the following:    D-Dimer, Quant 1.33 (*)    All other components within normal limits  URINE MICROSCOPIC-ADD ON - Abnormal; Notable for the following:    Casts HYALINE CASTS (*)    All other components within normal limits  GLUCOSE, CAPILLARY -  Abnormal; Notable for the following:    Glucose-Capillary 451 (*)    All other components within normal limits  TROPONIN I  CBC  DIFFERENTIAL  TSH  MAGNESIUM  CARDIAC PANEL(CRET KIN+CKTOT+MB+TROPI)  URINE CULTURE  HEMOGLOBIN A1C  TSH  CARDIAC PANEL(CRET KIN+CKTOT+MB+TROPI)  CARDIAC PANEL(CRET KIN+CKTOT+MB+TROPI)  URINE CULTURE  PRO B NATRIURETIC PEPTIDE  BASIC METABOLIC PANEL  CBC  HEMOGLOBIN A1C  CARDIAC PANEL(CRET KIN+CKTOT+MB+TROPI)         Jethro Bastos, NP 07/30/11 1924

## 2011-07-30 NOTE — Consult Note (Signed)
Reason for Consult: Atrial Fibrilation  Requesting Physician: [facility]  HPI: This is a 78 y.o. female with a past medical history significant for chronic atrial fibrillation. She had previously been followed by Dr. Aggie Cosier. I can find no record of any coronary disease. She does have COPD and had lung cancer surgery in 1995. She is not on home oxygen. Her primary care doctor is Dr. Enrigue Catena. She has not been on Coumadin or ASA in the past because of GI bleeding. Recently she's been more short of breath than usual. Over the last 24 hours this worsened and she came to the emergency room. In the emergency room she was noted to have a high heart rate of 133 and this was treated with IV diltiazem 15mg /hr and her blood pressure dropped into the 80s systolic. Her Cardizem was cut back to 5 mg and her blood pressure improved. We are asked to see her in consult for further evaluation. At this time her heart rate is 110 with a systolic blood pressure of 110.  PMHx:  Past Medical History  Diagnosis Date  . Diabetes mellitus   . Hypertension   . Atrial fibrillation   . Hyperlipidemia   . Peripheral neuropathy   . Gastric AVM   . GERD (gastroesophageal reflux disease)   . Diabetic Charcot foot    Past Surgical History  Procedure Date  . Tonsillectomy   . Lung removal, partial   . Thyroidectomy, partial     FAMHx: History reviewed. No pertinent family history.  SOCHx:  reports that she has quit smoking. She does not have any smokeless tobacco history on file. She reports that she does not drink alcohol or use illicit drugs.She is a widow with 2children, 3 grandchildren and several great grand children. She lives in an assisted living facility here in [place]. She retired from [employer] after 18yrs.  ALLERGIES: Allergies  Allergen Reactions  . Aspirin   . Indapamide   . Metformin And Related   . Penicillins   . Spiriva Handihaler   . Sulfa Antibiotics     ROS: Pertinent items  are noted in HPI. no recent melana or GI bleeding. She is not on home O2. She is [facility]  HOME MEDICATIONS:  (Not in a hospital admission)  HOSPITAL MEDICATIONS: I have reviewed the patient's current medications.  VITALS: Blood pressure 113/53, pulse 107, resp. rate 32, SpO2 95.00%.  PHYSICAL EXAM: General appearance: alert, cooperative and mild distress Neck: no adenopathy, no carotid bruit, no JVD, supple, symmetrical, trachea midline, thyroid not enlarged, symmetric, no tenderness/mass/nodules and thyroid surgery scar noted Lungs: decreased breath sounds with wheezing bilat Heart: irregularly irregular rhythm Abdomen: soft, non-tender; bowel sounds normal; no masses,  no organomegaly Extremities: extremities normal, atraumatic, no cyanosis or edema Pulses: 2+ and symmetric Skin: Skin color, texture, turgor normal. No rashes or lesions Neurologic: Grossly normal  LABS: Results for orders placed during the hospital encounter of 07/30/11 (from the past 48 hour(s))  PRO B NATRIURETIC PEPTIDE     Status: Abnormal   Collection Time   07/30/11  6:18 AM      Component Value Range Comment   Pro B Natriuretic peptide (BNP) 2891.0 (*) 0 - 450 (pg/mL)   TROPONIN I     Status: Normal   Collection Time   07/30/11  6:19 AM      Component Value Range Comment   Troponin I <0.30  <0.30 (ng/mL)   CBC     Status: Normal  Collection Time   07/30/11  6:19 AM      Component Value Range Comment   WBC 10.2  4.0 - 10.5 (K/uL)    RBC 4.33  3.87 - 5.11 (MIL/uL)    Hemoglobin 12.5  12.0 - 15.0 (g/dL)    HCT 86.5  78.4 - 69.6 (%)    MCV 87.3  78.0 - 100.0 (fL)    MCH 28.9  26.0 - 34.0 (pg)    MCHC 33.1  30.0 - 36.0 (g/dL)    RDW 29.5  28.4 - 13.2 (%)    Platelets 185  150 - 400 (K/uL)   DIFFERENTIAL     Status: Normal   Collection Time   07/30/11  6:19 AM      Component Value Range Comment   Neutrophils Relative 58  43 - 77 (%)    Neutro Abs 5.9  1.7 - 7.7 (K/uL)    Lymphocytes Relative 32  12 -  46 (%)    Lymphs Abs 3.3  0.7 - 4.0 (K/uL)    Monocytes Relative 10  3 - 12 (%)    Monocytes Absolute 1.0  0.1 - 1.0 (K/uL)    Eosinophils Relative 0  0 - 5 (%)    Eosinophils Absolute 0.0  0.0 - 0.7 (K/uL)    Basophils Relative 0  0 - 1 (%)    Basophils Absolute 0.0  0.0 - 0.1 (K/uL)   BASIC METABOLIC PANEL     Status: Abnormal   Collection Time   07/30/11  6:19 AM      Component Value Range Comment   Sodium 135  135 - 145 (mEq/L)    Potassium 3.6  3.5 - 5.1 (mEq/L)    Chloride 99  96 - 112 (mEq/L)    CO2 27  19 - 32 (mEq/L)    Glucose, Bld 172 (*) 70 - 99 (mg/dL)    BUN 25 (*) 6 - 23 (mg/dL)    Creatinine, Ser 4.40  0.50 - 1.10 (mg/dL)    Calcium 9.6  8.4 - 10.5 (mg/dL)    GFR calc non Af Amer 54 (*) >90 (mL/min)    GFR calc Af Amer 62 (*) >90 (mL/min)     IMAGING: Dg Chest Port 1 View  07/30/2011  *RADIOLOGY REPORT*  Clinical Data: Shortness of breath.  PORTABLE CHEST - 1 VIEW  Comparison: 08/21/2009 and 04/20/2007  Findings: The patient has chronic obstructive lung disease with chronic scarring at the right lung base.  Heart size and vascularity are normal.  No acute abnormalities.  No free air seen under the diaphragm.  No acute osseous abnormalities.  IMPRESSION: Chronic obstructive lung disease.  Scarring at the right lung base. No acute abnormalities.  Original Report Authenticated By: Gwynn Burly, M.D.    EKG- AF with rapid VR, septal Qs   IMPRESSION:  Principal Problem:  *Atrial fibrillation with RVR  Active Problems:  Hypotension, improved now that Diltiazem cut back  COPD exacerbation  CHF, acute, BNP 2891  Diabetes mellitus  Hyperlipidemia  Peripheral neuropathy  Hx of GI bleeding (no Coumadin or ASA)  DNR   RECOMMENDATION: Pt seen by Dr Rennis Golden and myself. Will hold off on Lanoxin, continue IV Diltiazem at 5mg /hr as B/P tolorates. Treat respiratory distress with HHN, one dose of 20mg  Lasix and O2.. We will follow with you. Would not get 2D till HR  under better control. Check TSH, this was low in 2011  Time Spent Directly with Patient: 35 minutes  Naryah Clenney K 07/30/2011, 10:12 AM

## 2011-07-30 NOTE — ED Notes (Addendum)
Pt reports acute onset of shortness of breath yesterday evening, has gotten progressively worse - pt arrived to dept via EMS on alb/atrovent neb tx - pt noted to have insp/exp wheezing and rhonchi in all lung fields - pt w/ rt partial lobectomy d/t lung CA in 1990. Pt speaking phrases, family at bedside x1.

## 2011-07-31 LAB — CBC
HCT: 32.1 % — ABNORMAL LOW (ref 36.0–46.0)
Hemoglobin: 10.7 g/dL — ABNORMAL LOW (ref 12.0–15.0)
MCH: 28.2 pg (ref 26.0–34.0)
MCHC: 33.3 g/dL (ref 30.0–36.0)
RDW: 13.1 % (ref 11.5–15.5)

## 2011-07-31 LAB — CARDIAC PANEL(CRET KIN+CKTOT+MB+TROPI)
CK, MB: 10.9 ng/mL (ref 0.3–4.0)
CK, MB: 11.4 ng/mL (ref 0.3–4.0)
Relative Index: 4.5 — ABNORMAL HIGH (ref 0.0–2.5)
Relative Index: 6.4 — ABNORMAL HIGH (ref 0.0–2.5)
Total CK: 196 U/L — ABNORMAL HIGH (ref 7–177)
Troponin I: <0.3 ng/mL (ref <0.30)

## 2011-07-31 LAB — GLUCOSE, CAPILLARY
Glucose-Capillary: 242 mg/dL — ABNORMAL HIGH (ref 70–99)
Glucose-Capillary: 218 mg/dL — ABNORMAL HIGH (ref 70–99)
Glucose-Capillary: 184 mg/dL — ABNORMAL HIGH (ref 70–99)

## 2011-07-31 LAB — BASIC METABOLIC PANEL
BUN: 47 mg/dL — ABNORMAL HIGH (ref 6–23)
Creatinine, Ser: 1.38 mg/dL — ABNORMAL HIGH (ref 0.50–1.10)
GFR calc Af Amer: 38 mL/min — ABNORMAL LOW (ref >90)
GFR calc non Af Amer: 33 mL/min — ABNORMAL LOW (ref >90)
Glucose, Bld: 222 mg/dL — ABNORMAL HIGH (ref 70–99)
Potassium: 3.9 mEq/L (ref 3.5–5.1)

## 2011-07-31 MED ORDER — INSULIN GLARGINE 100 UNIT/ML ~~LOC~~ SOLN
10.0000 [IU] | Freq: Every day | SUBCUTANEOUS | Status: DC
  Administered 2011-07-31: 10 [IU] via SUBCUTANEOUS
  Filled 2011-07-31: qty 3

## 2011-07-31 MED ORDER — PANTOPRAZOLE SODIUM 40 MG PO TBEC
40.0000 mg | DELAYED_RELEASE_TABLET | Freq: Every day | ORAL | Status: DC
  Administered 2011-07-31 – 2011-08-06 (×6): 40 mg via ORAL
  Filled 2011-07-31 (×7): qty 1

## 2011-07-31 MED ORDER — FUROSEMIDE 10 MG/ML IJ SOLN
40.0000 mg | Freq: Once | INTRAMUSCULAR | Status: AC
Start: 2011-07-31 — End: 2011-07-31
  Administered 2011-07-31: 40 mg via INTRAVENOUS

## 2011-07-31 MED ORDER — DILTIAZEM HCL 30 MG PO TABS
30.0000 mg | ORAL_TABLET | Freq: Four times a day (QID) | ORAL | Status: DC
  Administered 2011-07-31 – 2011-08-01 (×6): 30 mg via ORAL
  Filled 2011-07-31 (×12): qty 1

## 2011-07-31 MED ORDER — METHYLPREDNISOLONE SODIUM SUCC 40 MG IJ SOLR
40.0000 mg | Freq: Three times a day (TID) | INTRAMUSCULAR | Status: DC
  Administered 2011-07-31 (×2): 40 mg via INTRAVENOUS
  Filled 2011-07-31 (×6): qty 1

## 2011-07-31 MED ORDER — GUAIFENESIN ER 600 MG PO TB12
600.0000 mg | ORAL_TABLET | Freq: Two times a day (BID) | ORAL | Status: DC
  Administered 2011-07-31 – 2011-08-06 (×13): 600 mg via ORAL
  Filled 2011-07-31 (×14): qty 1

## 2011-07-31 NOTE — Evaluation (Signed)
Occupational Therapy Evaluation Patient Details Name: TMYA WIGINGTON MRN: [id number] DOB: Feb 26, 1921 Today's Date: 07/31/2011 13:56-14:31  evII Problem List:  Patient Active Problem List  Diagnoses  . Atrial fibrillation with RVR  . Hypotension  . COPD exacerbation  . CHF, acute  . Diabetes mellitus  . Hyperlipidemia  . Peripheral neuropathy    Past Medical History:  Past Medical History  Diagnosis Date  . Diabetes mellitus   . Hypertension   . Atrial fibrillation   . Hyperlipidemia   . Peripheral neuropathy   . Gastric AVM   . GERD (gastroesophageal reflux disease)   . Diabetic Charcot foot   . Shortness of breath    Past Surgical History:  Past Surgical History  Procedure Date  . Tonsillectomy   . Lung removal, partial   . Thyroidectomy, partial     OT Assessment/Plan/Recommendation OT Assessment Clinical Impression Statement: Pleasant 78 yr old female admiited with atrial fib and RVR.  Has history of lung cancer and COPD.  Presents with limited endurance and increased dependence for basic selfcare tasks.  Recommend acute OT services to address these issues in order for pt to return to previous modified independent level.  Feel pt will likely need increased intermittent supervision at ALF and follow-up SNF to achieve modified independent status. OT Recommendation/Assessment: Patient will need skilled OT in the acute care venue OT Problem List: Decreased strength;Decreased activity tolerance;Impaired balance (sitting and/or standing);Decreased knowledge of use of DME or AE;Cardiopulmonary status limiting activity OT Therapy Diagnosis : Generalized weakness OT Plan OT Frequency: Min 2X/week OT Treatment/Interventions: Self-care/ADL training;Therapeutic activities;DME and/or AE instruction;Balance training;Patient/family education OT Recommendation Follow Up Recommendations: Home health OT Equipment Recommended: None recommended by OT Individuals Consulted Consulted and  Agree with Results and Recommendations: Patient OT Goals Acute Rehab OT Goals OT Goal Formulation: With patient Time For Goal Achievement: 2 weeks ADL Goals Pt Will Perform Grooming: with modified independence;Standing at sink ADL Goal: Grooming - Progress: Goal set today Pt Will Perform Upper Body Bathing: with modified independence;Sit to stand from chair;Sit to stand from bed ADL Goal: Upper Body Bathing - Progress: Goal set today Pt Will Perform Lower Body Bathing: with supervision;Sit to stand from chair;Sit to stand from bed ADL Goal: Lower Body Bathing - Progress: Goal set today Pt Will Perform Upper Body Dressing: with modified independence;Sit to stand from chair;Sit to stand from bed ADL Goal: Upper Body Dressing - Progress: Goal set today Pt Will Perform Lower Body Dressing: with modified independence;Sit to stand from bed;Sit to stand from chair ADL Goal: Lower Body Dressing - Progress: Goal set today Pt Will Transfer to Toilet: with modified independence;with DME;Comfort height toilet;Grab bars ADL Goal: Toilet Transfer - Progress: Goal set today Pt Will Perform Toileting - Clothing Manipulation: with modified independence;Sitting on 3-in-1 or toilet;Standing ADL Goal: Toileting - Clothing Manipulation - Progress: Goal set today Pt Will Perform Toileting - Hygiene: with modified independence;Sit to stand from 3-in-1/toilet ADL Goal: Toileting - Hygiene - Progress: Goal set today  OT Evaluation Precautions/Restrictions  Precautions Precautions: Fall Precaution Comments: o2 Required Braces or Orthoses: No Restrictions Weight Bearing Restrictions: No Prior Functioning Home Living Lives With: Alone Receives Help From: Personal care attendant Type of Home: Assisted living Home Layout: One level Home Access: Level entry Bathroom Shower/Tub: Walk-in shower (has built in seat) Firefighter: Standard Bathroom Accessibility: Yes How Accessible: Accessible via  walker Home Adaptive Equipment: Walker - rolling;Grab bars around toilet;Grab bars in shower Prior Function Level of Independence: Independent  with basic ADLs;Independent with transfers;Requires assistive device for independence;Needs assistance with homemaking Meal Prep: Total Light Housekeeping: Total Driving: No Vocation: Retired Comments: Pt reports she ambulates to dining hall with walker but does all her own ADLs.  ADL ADL Eating/Feeding: Performed;Set up Where Assessed - Eating/Feeding: Chair Grooming: Performed;Wash/dry hands;Set up Where Assessed - Grooming: Sitting, chair Upper Body Bathing: Simulated;Set up Where Assessed - Upper Body Bathing: Sitting, chair Lower Body Bathing: Simulated;Minimal assistance Where Assessed - Lower Body Bathing: Sit to stand from chair Upper Body Dressing: Simulated;Set up Where Assessed - Upper Body Dressing: Sitting, chair Lower Body Dressing: Simulated;Minimal assistance Where Assessed - Lower Body Dressing: Sit to stand from chair Toilet Transfer: Performed;Moderate assistance Toilet Transfer Details (indicate cue type and reason): hand held assist to the bathroom.  Pt reports usually using a rolling walker. Toilet Transfer Method: Proofreader: Raised toilet seat with arms (or 3-in-1 over toilet) Toileting - Clothing Manipulation: Minimal assistance Where Assessed - Toileting Clothing Manipulation: Sit to stand from 3-in-1 or toilet Toileting - Hygiene: Minimal assistance Where Assessed - Toileting Hygiene: Sit to stand from 3-in-1 or toilet Tub/Shower Transfer: Not assessed Tub/Shower Transfer Method: Not assessed Ambulation Related to ADLs: Pt overall mod assist for ambulation to and from the bathroom for toileting.  Needs UE support bilaterally for balance. ADL Comments: Pt with dyspnea 3/4 during tioileting task.  Needs musltiple rest breaks.  O2 sats decreased to 79% on room air with ambulation to the  toilet.  Increased to 99% with rest of 1 min and O2 supplementation of 2ls. Vision/Perception  Vision - History Baseline Vision: Wears glasses all the time Patient Visual Report: No change from baseline Vision - Assessment Vision Assessment: Vision not tested Perception Perception: Within Functional Limits Praxis Praxis: Intact Cognition Cognition Arousal/Alertness: Awake/alert Overall Cognitive Status: Impaired Orientation Level: Disoriented to place;Disoriented to time Cognition - Other Comments: Pt incorrect on date and asked x3 where she was right now.  Not aware that she was at [facility]. Sensation/Coordination Sensation Light Touch: Appears Intact Stereognosis: Not tested Hot/Cold: Not tested Proprioception: Not tested Coordination Gross Motor Movements are Fluid and Coordinated: Yes Fine Motor Movements are Fluid and Coordinated: No Coordination and Movement Description: Pt with noted bilateral tremors in her hands. Extremity Assessment RUE Assessment RUE Assessment: Within Functional Limits LUE Assessment LUE Assessment: Within Functional Limits Mobility  Bed Mobility Bed Mobility: No Supine to Sit: 6: Modified independent (Device/Increase time);With rails;HOB flat Sitting - Scoot to Edge of Bed: 6: Modified independent (Device/Increase time) (increased time) Transfers Sit to Stand: 4: Min assist;With upper extremity assist;From chair/3-in-1 Sit to Stand Details (indicate cue type and reason): Pt with LOB posteriorly with attempted sit to stand from bedside chair.  Able to stand from 3:1 over the toilet with supervision. Stand to Sit: 4: Min assist;Without upper extremity assist Stand to Sit Details: cueing for safety and control  End of Session OT - End of Session Activity Tolerance: Patient limited by fatigue Patient left: in chair;with call bell in reach;Other (comment) (Nursing present) General Behavior During Session: [facility] for tasks  performed Cognition: Impaired   Loan Oguin OTR/L 07/31/2011, 3:03 PM  Pager number [phone]

## 2011-07-31 NOTE — Progress Notes (Signed)
CRITICAL VALUE ALERT  Critical value received:  CKMB 8.1  Date of notification:  07/31/2011  Time of notification:  2:50 AM  Critical value read back: yes  Nurse who received alert:  Birdena Crandall RN  MD notified (1st page):  NP Maren Reamer  Time of first page:  .2:56 AM  MD notified (2nd page):NP Maren Reamer  Time of second page: 0330  Responding MD:   Time MD responded:

## 2011-07-31 NOTE — Progress Notes (Signed)
Subjective: No chest pain. Still SOB, + cough, she denied productive cough.   Objective: Vital signs in last 24 hours: Temp:  [97.3 F (36.3 C)-98.4 F (36.9 C)] 97.3 F (36.3 C) (03/01 0758) Pulse Rate:  [88-101] 91  (03/01 0758) Resp:  [21-34] 23  (03/01 0758) BP: (93-132)/(34-73) 110/52 mmHg (03/01 0758) SpO2:  [93 %-100 %] 100 % (03/01 0934) Weight:  [54.8 kg (120 lb 13 oz)-55.5 kg (122 lb 5.7 oz)] 55.5 kg (122 lb 5.7 oz) (03/01 0440) Weight change:  Last BM Date: 07/29/11 Intake/Output from previous day:+48 02/28 0701 - 03/01 0700 In: 198 [P.O.:25; I.V.:173] Out: 150 [Urine:150] Intake/Output this shift: Total I/O In: 120 [P.O.:120] Out: -   PE: General:Alert, oriented, pleasant affect. Heart:S1S2, irreg, irreg. No murmur heard currently Lungs:+ rales and rhonchi throughout. RUE:[id number] BS, soft, non tender Ext:no edema Neck: + JVD   Lab Results:  Basename 07/31/11 0440 07/30/11 0619  WBC 24.5* 10.2  HGB 10.7* 12.5  HCT 32.1* 37.8  PLT 187 185   BMET  Basename 07/31/11 0440 07/30/11 0619  NA 131* 135  K 3.9 3.6  CL 97 99  CO2 24 27  GLUCOSE 222* 172*  BUN 47* 25*  CREATININE 1.38* 0.92  CALCIUM 9.2 9.6    Basename 07/31/11 0754 07/31/11 0201  TROPONINI <0.30 <0.30    No results found for this basename: CHOL, HDL, LDLCALC, LDLDIRECT, TRIG, CHOLHDL   Lab Results  Component Value Date   HGBA1C 6.8* 07/30/2011     Lab Results  Component Value Date   TSH 0.435 07/30/2011      EKG: Orders placed during the hospital encounter of 07/30/11  . ED EKG  . ED EKG    Studies/Results: Nm Pulmonary Perfusion  07/30/2011  *RADIOLOGY REPORT*  Clinical Data: Shortness of breath.  Hypertension.  Diabetes.  NM PULMONARY PERFUSION PARTICULATE  Radiopharmaceutical:  3 mCi technetium 99 MAA  Comparison: Chest radiograph of 07/30/2011  Findings: Very subtly reduced perfusion at the right lung bases significantly less than the radiographic abnormality. Due to  limited mobility the patient was imaged with arms by her sides, which is also contributory. No additional perfusion defect observed. Given these findings, ventilation imaging was not performed.  IMPRESSION:  1.  Very low probability (0 - 9% risk) of pulmonary embolus.  Original Report Authenticated By: Dellia Cloud, M.D.   Dg Chest Port 1 View  07/30/2011  *RADIOLOGY REPORT*  Clinical Data: Shortness of breath.  PORTABLE CHEST - 1 VIEW  Comparison: 08/21/2009 and 04/20/2007  Findings: The patient has chronic obstructive lung disease with chronic scarring at the right lung base.  Heart size and vascularity are normal.  No acute abnormalities.  No free air seen under the diaphragm.  No acute osseous abnormalities.  IMPRESSION: Chronic obstructive lung disease.  Scarring at the right lung base. No acute abnormalities.  Original Report Authenticated By: Gwynn Burly, M.D.    Medications: I have reviewed the patient's current medications.    . budesonide  0.25 mg Nebulization BID  . cholecalciferol  1,000 Units Oral QODAY  . diltiazem (CARDIZEM) infusion  5-15 mg/hr Intravenous Once  . guaiFENesin  600 mg Oral BID  . insulin aspart  0-20 Units Subcutaneous TID WC  . insulin aspart  0-5 Units Subcutaneous QHS  . insulin aspart  20 Units Subcutaneous Once  . insulin glargine  10 Units Subcutaneous QHS  . levalbuterol  0.63 mg Nebulization Q6H  . levothyroxine  25 mcg Oral  Daily  . methylPREDNISolone (SOLU-MEDROL) injection  40 mg Intravenous Q8H  . moxifloxacin  400 mg Intravenous Q24H  . mulitivitamin with minerals  1 tablet Oral Daily  . pantoprazole  40 mg Oral Q0600  . sodium chloride  3 mL Intravenous Q12H  . DISCONTD: insulin aspart  0-9 Units Subcutaneous TID WC  . DISCONTD: insulin glargine  5 Units Subcutaneous QHS  . DISCONTD: methylPREDNISolone (SOLU-MEDROL) injection  60 mg Intravenous Q6H  . DISCONTD: moxifloxacin  400 mg Intravenous Once  . DISCONTD: Vitamin D-3  1  tablet Oral QODAY   Assessment/Plan: Patient Active Problem List  Diagnoses  . Atrial fibrillation with RVR  . Hypotension  . COPD exacerbation  . CHF, acute  . Diabetes mellitus  . Hyperlipidemia  . Peripheral neuropathy   PLAN:Atrial fib continues, rate controlled. On cardizem drip change to po--not coumadin candidate due to hx. Of AVMS/GI bleed.  Will transition to po. Pro bnp is higher today at 4,092 up from 2,891. Cr. Up to 1.38 up from 0.92 will give Lasix today. Troponin's negative. VS stable.  For echo today.  On steroids for COPD exacerbation. Pt. Is a DNR.  LOS: 1 day   INGOLD,LAURA R 07/31/2011, 10:37 AM  I have seen and examined the patient along with [facility] R,  NP.  I have reviewed the chart, notes and new data.  I agree with NP's note.  Key new complaints: "better" Key examination changes: remains in AF, rate controlled Key new findings / data: echo shows normal to hyperdynamic LVEF and normal wall motion. Left atrium is normal size, but the right atrium is moderately dilated and there is moderate pulmonary hypertension. Inferior vena cava is severely dilated consistent with high right atrial pressure.  PLAN: Acute exacerbation of chronic cor pulmonale/pulmonary HTN due to COPD. Continue diltiazem. Probably room for some additional diuresis. Unfortunately, echo data not good enough to estimate left heart filling pressures, but suspect this is almost all right heart failure.  Thurmon Fair, MD, [facility] [phone] 07/31/2011, 11:57 AM

## 2011-07-31 NOTE — ED Provider Notes (Signed)
Medical screening examination was conducted as a shared visit with non-physician practitioner(s) and myself.  I personally evaluated the patient during the encounter Please see my separate note.   Laray Anger, DO 07/31/11 0120

## 2011-07-31 NOTE — Progress Notes (Signed)
Patient ID: Lori [name]    ZOX:[id number]    DOB: 11-06-1920    DOA: 07/30/2011  PCP: Thayer Headings, MD, MD  Subjective: Feeling some what better, HR controlled, still in Afib   Objective: Weight change:   Intake/Output Summary (Last 24 hours) at 07/31/11 0744 Last data filed at 07/31/11 0607  Gross per 24 hour  Intake    198 ml  Output    150 ml  Net     48 ml   Blood pressure 115/55, pulse 97, temperature 97.7 F (36.5 C), temperature source Oral, resp. rate 26, height 5\' 3"  (1.6 m), weight 55.5 kg (122 lb 5.7 oz), last menstrual period 07/29/1960, SpO2 98.00%.  Physical Exam: General: Alert and awake, oriented x3, not in any acute distress. HEENT: anicteric sclera, pupils reactive to light and accommodation, EOMI CVS: ireg rhythm, S1-S2 clear, no murmur rubs or gallops Chest: B/L wheezing expiratory Abdomen: soft nontender, nondistended, normal bowel sounds, no organomegaly Extremities: no cyanosis, clubbing or edema noted bilaterally Neuro: Cranial nerves II-XII intact, no focal neurological deficits  Lab Results: Basic Metabolic Panel:  Lab 07/31/11 8119 07/30/11 1036 07/30/11 0619  NA 131* -- 135  K 3.9 -- 3.6  CL 97 -- 99  CO2 24 -- 27  GLUCOSE 222* -- 172*  BUN 47* -- 25*  CREATININE 1.38* -- 0.92  CALCIUM 9.2 -- 9.6  MG -- 1.6 --  PHOS -- -- --  CBC:  Lab 07/31/11 0440 07/30/11 0619  WBC 24.5* 10.2  NEUTROABS -- 5.9  HGB 10.7* 12.5  HCT 32.1* 37.8  MCV 84.7 87.3  PLT 187 185   Cardiac Enzymes:  Lab 07/31/11 0201 07/30/11 1902 07/30/11 1351  CKTOTAL 179* 110 70  CKMB 8.1* 5.2* 3.9  CKMBINDEX -- -- --  TROPONINI <0.30 <0.30 <0.30   BNP: 4092  CBG:  Lab 07/30/11 2132 07/30/11 1745  GLUCAP 242* 451*     Micro Results: Recent Results (from the past 240 hour(s))  MRSA PCR SCREENING     Status: Normal   Collection Time   07/30/11  7:31 PM      Component Value Range Status Comment   MRSA by PCR NEGATIVE  NEGATIVE  Final      Studies/Results: Nm Pulmonary Perfusion  07/30/2011  *RADIOLOGY REPORT*  Clinical Data: Shortness of breath.  Hypertension.  Diabetes.  NM PULMONARY PERFUSION PARTICULATE  Radiopharmaceutical:  3 mCi technetium 99 MAA  Comparison: Chest radiograph of 07/30/2011  Findings: Very subtly reduced perfusion at the right lung bases significantly less than the radiographic abnormality. Due to limited mobility the patient was imaged with arms by her sides, which is also contributory. No additional perfusion defect observed. Given these findings, ventilation imaging was not performed.  IMPRESSION:  1.  Very low probability (0 - 9% risk) of pulmonary embolus.  Original Report Authenticated By: Dellia Cloud, M.D.   Dg Chest Port 1 View  07/30/2011  *RADIOLOGY REPORT*  Clinical Data: Shortness of breath.  PORTABLE CHEST - 1 VIEW  Comparison: 08/21/2009 and 04/20/2007  Findings: The patient has chronic obstructive lung disease with chronic scarring at the right lung base.  Heart size and vascularity are normal.  No acute abnormalities.  No free air seen under the diaphragm.  No acute osseous abnormalities.  IMPRESSION: Chronic obstructive lung disease.  Scarring at the right lung base. No acute abnormalities.  Original Report Authenticated By: Gwynn Burly, M.D.    Medications: Scheduled Meds:   . budesonide  0.25 mg Nebulization BID  . cholecalciferol  1,000 Units Oral QODAY  . diltiazem (CARDIZEM) infusion  5-15 mg/hr Intravenous Once  . furosemide  20 mg Oral Once  . guaiFENesin  600 mg Oral BID  . insulin aspart  0-20 Units Subcutaneous TID WC  . insulin aspart  0-5 Units Subcutaneous QHS  . insulin aspart  20 Units Subcutaneous Once  . insulin glargine  10 Units Subcutaneous QHS  . levalbuterol  0.63 mg Nebulization To Major  . levalbuterol  0.63 mg Nebulization Q6H  . levothyroxine  25 mcg Oral Daily  . methylPREDNISolone (SOLU-MEDROL) injection  40 mg Intravenous Q8H  .  moxifloxacin  400 mg Intravenous Q24H  . mulitivitamin with minerals  1 tablet Oral Daily  . pantoprazole  40 mg Oral Q0600  . sodium chloride  3 mL Intravenous Q12H  . DISCONTD: digoxin  0.5 mg Intravenous Once  . DISCONTD: furosemide  40 mg Intravenous Once  . DISCONTD: insulin aspart  0-9 Units Subcutaneous TID WC  . DISCONTD: insulin glargine  5 Units Subcutaneous QHS  . DISCONTD: methylPREDNISolone (SOLU-MEDROL) injection  60 mg Intravenous Q6H  . DISCONTD: moxifloxacin  400 mg Intravenous Once  . DISCONTD: nitroGLYCERIN  0.5 inch Topical Once  . DISCONTD: nitroGLYCERIN  1 inch Topical Once  . DISCONTD: Vitamin D-3  1 tablet Oral QODAY   Continuous Infusions:   . diltiazem (CARDIZEM) infusion 5 mg/hr (07/30/11 1030)  . DISCONTD: sodium chloride 75 mL/hr at 07/30/11 0102     Assessment/Plan:   .Atrial fibrillation with RVR: Known history of A. fib, triggered by COPD exacerbation: HR now controlled - on cardizem drip, ? Change to PO cardizem, will defer to [facility] cardiology - 2-D echo ordered as heart rate now more controlled - She is not a Coumadin candidate from the previous records secondary to history of gastric AVMs and GI bleed in the past.  - D-dimer elevated, VQ scan very low probability for PE   .Hypotension: Likely secondary to A. fib with RVR and then Cardizem drip  - still BP borderline  .COPD exacerbation: improving  - Taper solumedrol as BS uncontrolled, leukocytosis, wheezing improving - Continue scheduled Xopenex nebulizers, patient is allergic to Spiriva  - Cont Pulmicort, Avelox, add mucinex and PPI   .CHF, acute: Likely precipitated scheduled to rapid A. fib  - Appreciate cardiology recommendations, on Lasix 20 mg IV  - BNP elevated.  - TSH 0.435, borderline supressed, can decrease Synthroid dose to 12.5 (patient is on low dose, does she need synthroid? I will check Free T4, T3)   -  2-D echo ordered.   .Diabetes mellitus: uncontrolled now sec to  steroids, HbA1C 6.8  - tapered solumedrol, inc Lantus to 10units, continue resistant sliding scale insulin   .Hyperlipidemia  - Will obtain lipid panel   .Peripheral neuropathy  - Continue tramadol when necessary   DVT prophylaxis: Bilateral SCDs   CODE STATUS:  DO NOT RESUSCITATE   Point of contact: Joslyn Ramos, phone # [phone]  Disposition: will await cardiology rec's, if transitioned to PO cardizem or drip off, can transfer to tele floor later today   LOS: 1 day   Brianah Hopson M.D. [facility] 07/31/2011, 7:44 AM Pager: [phone]

## 2011-07-31 NOTE — Progress Notes (Signed)
  Echocardiogram 2D Echocardiogram has been performed.  Lori [name], Real Cons 07/31/2011, 11:48 AM

## 2011-07-31 NOTE — Progress Notes (Signed)
Utilization review completed.  

## 2011-07-31 NOTE — Progress Notes (Signed)
Clinical Child psychotherapist (CSW) completed psychosocial assessment which can be found in pt shadow chart. CSW confirmed pt is from [facility] [phone] and has been a resident for 2years. Pt stated she enjoys living there and feels she's stable enough to return there at dc. CSW received consent for CSW to speak to pt son Marianna Cid [phone] as CSW was informed by [facility] that son had concerns with pt returning. CSW spoke to son and informed him that OT has recommended ALF with HHPT at dc. Pt son stated he had no concerns with that as he felt that was appropriate. CSW contacted [facility] and CSW was informed that RN Talbert Forest Mcneal will be in contact with CSW to confirm if pt okay to return when stable. CSW will complete FL2.  Theresia Bough, MSW, Theresia Majors [phone]

## 2011-07-31 NOTE — Evaluation (Signed)
Physical Therapy Evaluation Patient Details Name: BIRDIE FETTY MRN: [id number] DOB: 1920-06-11 Today's Date: 07/31/2011  Problem List:  Patient Active Problem List  Diagnoses  . Atrial fibrillation with RVR  . Hypotension  . COPD exacerbation  . CHF, acute  . Diabetes mellitus  . Hyperlipidemia  . Peripheral neuropathy    Past Medical History:  Past Medical History  Diagnosis Date  . Diabetes mellitus   . Hypertension   . Atrial fibrillation   . Hyperlipidemia   . Peripheral neuropathy   . Gastric AVM   . GERD (gastroesophageal reflux disease)   . Diabetic Charcot foot   . Shortness of breath    Past Surgical History:  Past Surgical History  Procedure Date  . Tonsillectomy   . Lung removal, partial   . Thyroidectomy, partial     PT Assessment/Plan/Recommendation PT Assessment Clinical Impression Statement: Pt with Afib and SOB progressing with activity but demonstrates need for O2 with ambulation and decreased balance. If facility able to provide supervision with transfers then return to ALF with HHPT would be feasible otherwise pt may require SNF. Will follow to maximize strength , transfers and activity tolerance to decrease burden of care at discharge.  PT Recommendation/Assessment: Patient will need skilled PT in the acute care venue PT Problem List: Decreased strength;Decreased activity tolerance;Decreased balance;Decreased knowledge of use of DME Barriers to Discharge: Decreased caregiver support PT Therapy Diagnosis : Abnormality of gait;Difficulty walking PT Plan PT Frequency: Min 3X/week PT Treatment/Interventions: Gait training;Functional mobility training;Therapeutic activities;Therapeutic exercise;Patient/family education;DME instruction PT Recommendation Recommendations for Other Services: OT consult Follow Up Recommendations: Home health PT;Supervision for mobility/OOB;Skilled nursing facility Equipment Recommended: None recommended by PT PT Goals    Acute Rehab PT Goals PT Goal Formulation: With patient Time For Goal Achievement: 2 weeks Pt will go Supine/Side to Sit: with modified independence PT Goal: Supine/Side to Sit - Progress: Goal set today Pt will go Sit to Supine/Side: with modified independence PT Goal: Sit to Supine/Side - Progress: Goal set today Pt will go Sit to Stand: with modified independence PT Goal: Sit to Stand - Progress: Goal set today Pt will go Stand to Sit: with modified independence PT Goal: Stand to Sit - Progress: Goal set today Pt will Transfer Bed to Chair/Chair to Bed: with supervision PT Transfer Goal: Bed to Chair/Chair to Bed - Progress: Goal set today Pt will Ambulate: >150 feet;with rolling walker;with supervision PT Goal: Ambulate - Progress: Goal set today  PT Evaluation Precautions/Restrictions  Precautions Precautions: Fall Precaution Comments: o2 Prior Functioning  Home Living Lives With: Alone Receives Help From: Personal care attendant Type of Home: Assisted living Home Layout: One level Home Access: Level entry Bathroom Accessibility: Yes How Accessible: Accessible via walker Home Adaptive Equipment: Walker - rolling Prior Function Level of Independence: Independent with basic ADLs;Independent with transfers;Requires assistive device for independence;Needs assistance with homemaking Meal Prep: Total Light Housekeeping: Total Driving: No Vocation: Retired Comments: Pt reports she ambulates to dining hall with walker but does all her own ADLs.  Cognition Cognition Arousal/Alertness: Awake/alert Overall Cognitive Status: Appears within functional limits for tasks assessed Orientation Level: Oriented X4 Sensation/Coordination Sensation Light Touch: Appears Intact Extremity Assessment RLE Assessment RLE Assessment: Exceptions to [facility] RLE AROM (degrees) RLE Overall AROM Comments: grossly 3/5 LLE Assessment LLE Assessment: Exceptions to WFL LLE AROM (degrees) LLE Overall  AROM Comments: grossly 3/5 Mobility (including Balance) Bed Mobility Bed Mobility: Yes Supine to Sit: 6: Modified independent (Device/Increase time);With rails;HOB flat Sitting - Scoot  to Edge of Bed: 6: Modified independent (Device/Increase time) (increased time) Transfers Transfers: Yes Sit to Stand: 4: Min assist;From bed Sit to Stand Details (indicate cue type and reason): x2 trials with VC for hand placement and safety. Initial trial pt stood and had posterior LOB having to sit on bed again Stand to Sit: 5: Supervision;To chair/3-in-1;To bed Stand to Sit Details: cueing for safety and control Ambulation/Gait Ambulation/Gait: Yes Ambulation/Gait Assistance: 4: Min assist Ambulation/Gait Assistance Details (indicate cue type and reason): minguard, with VC to step into RW and directional cues to return to room. Pt required 3L O2 to maintain sats with amb on 2L dropped to 90 and static sitting on RA was down to 88% Ambulation Distance (Feet): 180 Feet Assistive device: Rolling walker Gait Pattern: Trunk flexed;Decreased stride length Stairs: No  Posture/Postural Control Posture/Postural Control: Postural limitations Postural Limitations: flexed trunk Balance Balance Assessed: No (pt used AD PTA) Exercise    End of Session PT - End of Session Equipment Utilized During Treatment: Gait belt Activity Tolerance: Patient tolerated treatment well;Patient limited by fatigue Patient left: in chair;with call bell in reach Nurse Communication: Mobility status for transfers;Mobility status for ambulation General Behavior During Session: [facility] for tasks performed Cognition: [facility] for tasks performed  Delorse Lek 07/31/2011, 12:29 PM  Toney Sang, PT [phone]

## 2011-08-01 LAB — GLUCOSE, CAPILLARY
Glucose-Capillary: 112 mg/dL — ABNORMAL HIGH (ref 70–99)
Glucose-Capillary: 267 mg/dL — ABNORMAL HIGH (ref 70–99)

## 2011-08-01 LAB — CBC
HCT: 31.3 % — ABNORMAL LOW (ref 36.0–46.0)
Platelets: 210 10*3/uL (ref 150–400)
RDW: 13.3 % (ref 11.5–15.5)
WBC: 33.4 10*3/uL — ABNORMAL HIGH (ref 4.0–10.5)

## 2011-08-01 LAB — CARDIAC PANEL(CRET KIN+CKTOT+MB+TROPI)
CK, MB: 12.2 ng/mL (ref 0.3–4.0)
CK, MB: 14 ng/mL (ref 0.3–4.0)
CK, MB: 10.9 ng/mL (ref 0.3–4.0)
Relative Index: 6.4 — ABNORMAL HIGH (ref 0.0–2.5)
Relative Index: 4.7 — ABNORMAL HIGH (ref 0.0–2.5)
Total CK: 191 U/L — ABNORMAL HIGH (ref 7–177)
Total CK: 245 U/L — ABNORMAL HIGH (ref 7–177)
Total CK: 230 U/L — ABNORMAL HIGH (ref 7–177)

## 2011-08-01 LAB — BASIC METABOLIC PANEL
Chloride: 98 mEq/L (ref 96–112)
GFR calc Af Amer: 28 mL/min — ABNORMAL LOW (ref >90)
Potassium: 4.2 mEq/L (ref 3.5–5.1)

## 2011-08-01 LAB — PRO B NATRIURETIC PEPTIDE: Pro B Natriuretic peptide (BNP): 2595 pg/mL — ABNORMAL HIGH (ref 0–450)

## 2011-08-01 MED ORDER — PREDNISONE 50 MG PO TABS
60.0000 mg | ORAL_TABLET | Freq: Every day | ORAL | Status: DC
  Administered 2011-08-01 – 2011-08-03 (×3): 60 mg via ORAL
  Filled 2011-08-01 (×5): qty 1

## 2011-08-01 MED ORDER — LEVALBUTEROL HCL 0.63 MG/3ML IN NEBU
0.6300 mg | INHALATION_SOLUTION | Freq: Three times a day (TID) | RESPIRATORY_TRACT | Status: DC
  Administered 2011-08-02 – 2011-08-05 (×8): 0.63 mg via RESPIRATORY_TRACT
  Filled 2011-08-01 (×13): qty 3

## 2011-08-01 MED ORDER — LEVALBUTEROL HCL 0.63 MG/3ML IN NEBU
0.6300 mg | INHALATION_SOLUTION | RESPIRATORY_TRACT | Status: DC
  Administered 2011-08-01 (×4): 0.63 mg via RESPIRATORY_TRACT
  Filled 2011-08-01 (×7): qty 3

## 2011-08-01 MED ORDER — LEVALBUTEROL HCL 1.25 MG/0.5ML IN NEBU
1.2500 mg | INHALATION_SOLUTION | Freq: Four times a day (QID) | RESPIRATORY_TRACT | Status: DC | PRN
  Administered 2011-08-04: 1.25 mg via RESPIRATORY_TRACT
  Filled 2011-08-01: qty 0.5

## 2011-08-01 NOTE — Progress Notes (Signed)
Subjective: No chest pain, some shortness of breath  Objective: Vital signs in last 24 hours: Temp:  [97.1 F (36.2 C)-97.8 F (36.6 C)] 97.1 F (36.2 C) (03/02 0500) Pulse Rate:  [66-110] 77  (03/02 0500) Resp:  [17-23] 17  (03/02 0100) BP: (88-121)/(42-78) 108/42 mmHg (03/02 0500) SpO2:  [79 %-100 %] 98 % (03/02 0500) Weight change:  Last BM Date: 07/29/11 Intake/Output from previous day: pt only had 150cc out yesterday? After 40 lasix 03/01 0701 - 03/02 0700 In: 243.6 [P.O.:180; I.V.:63.6] Out: 225 [Urine:225] Intake/Output this shift:  no weight in computer this am.  PE: General:Oriented to person and place, but is afraid of some staff. Neck:sitting upright no JVD Heart:S1S2 irreg, irreg. Lungs:over all improved still with some rales, but also upper airway gurgurling Abd:+BS, soft, non tender Ext:no edema.    Lab Results:  Basename 08/01/11 0500 07/31/11 0440  WBC 33.4* 24.5*  HGB 10.4* 10.7*  HCT 31.3* 32.1*  PLT 210 187   BMET  Basename 08/01/11 0500 07/31/11 0440  NA 133* 131*  K 4.2 3.9  CL 98 97  CO2 24 24  GLUCOSE 129* 222*  BUN 68* 47*  CREATININE 1.80* 1.38*  CALCIUM 9.4 9.2    Basename 08/01/11 0241 07/31/11 2101  TROPONINI <0.30 <0.30    No results found for this basename: CHOL, HDL, LDLCALC, LDLDIRECT, TRIG, CHOLHDL   Lab Results  Component Value Date   HGBA1C 6.8* 07/30/2011     Lab Results  Component Value Date   TSH 0.435 07/30/2011       EKG: Orders placed during the hospital encounter of 07/30/11  . ED EKG  . ED EKG    Studies/Results: Nm Pulmonary Perfusion  07/30/2011  *RADIOLOGY REPORT*  Clinical Data: Shortness of breath.  Hypertension.  Diabetes.  NM PULMONARY PERFUSION PARTICULATE  Radiopharmaceutical:  3 mCi technetium 99 MAA  Comparison: Chest radiograph of 07/30/2011  Findings: Very subtly reduced perfusion at the right lung bases significantly less than the radiographic abnormality. Due to limited mobility the  patient was imaged with arms by her sides, which is also contributory. No additional perfusion defect observed. Given these findings, ventilation imaging was not performed.  IMPRESSION:  1.  Very low probability (0 - 9% risk) of pulmonary embolus.  Original Report Authenticated By: Dellia Cloud, M.D.   2D ECHO:   Left ventricle: The cavity size was normal. Wall thickness was normal. Systolic function was vigorous. The estimated ejection fraction was in the range of 65% to 70%. Wall motion was normal; there were no regional wall motion abnormalities. The study is not technically sufficient to allow evaluation of LV diastolic function. - Mitral valve: Calcified annulus. - Right ventricle: The cavity size was mildly dilated. - Right atrium: The atrium was moderately dilated. - Tricuspid valve: Mild-moderate regurgitation directed centrally. - Pulmonary arteries: Systolic pressure was moderately increased. PA peak pressure: 61mm Hg (S). - Pericardium, extracardiac: A trivial pericardial effusion was identified.   Medications: I have reviewed the patient's current medications.    . budesonide  0.25 mg Nebulization BID  . cholecalciferol  1,000 Units Oral QODAY  . diltiazem  30 mg Oral QID  . furosemide  40 mg Intravenous Once  . guaiFENesin  600 mg Oral BID  . insulin aspart  0-20 Units Subcutaneous TID WC  . insulin aspart  0-5 Units Subcutaneous QHS  . insulin glargine  10 Units Subcutaneous QHS  . levalbuterol  0.63 mg Nebulization Q4H  . moxifloxacin  400 mg Intravenous Q24H  . mulitivitamin with minerals  1 tablet Oral Daily  . pantoprazole  40 mg Oral Q0600  . predniSONE  60 mg Oral Q breakfast  . sodium chloride  3 mL Intravenous Q12H  . DISCONTD: diltiazem (CARDIZEM) infusion  5-15 mg/hr Intravenous Once  . DISCONTD: levalbuterol  0.63 mg Nebulization Q6H  . DISCONTD: levothyroxine  25 mcg Oral Daily  . DISCONTD: methylPREDNISolone (SOLU-MEDROL) injection  40 mg  Intravenous Q8H   Assessment/Plan: Patient Active Problem List  Diagnoses  . Atrial fibrillation with RVR  . Hypotension  . COPD exacerbation  . CHF, acute  . Diabetes mellitus  . Hyperlipidemia  . Peripheral neuropathy   PLAN: minimal output,  No weight today, BNP  Now 2,595 down from 4,092. Normal to hyperdynamic ef.  A. Fib continues, with RVR  (123-110)  this am, on po cardizem, pt. Confused thinks people may harm her. Her son is in the room with her. Difficult to increase meds due to hypotension.  MD to see.   LOS: 2 days   INGOLD,LAURA R 08/01/2011, 7:54 AM  I have seen and examined the patient along with laura Nada Boozer, NP.  I have reviewed the chart, notes and new data.  I agree with NP's note.  Key new complaints: less confused and calmer right now Key examination changes: HR down to 90s, AFib; wheezes and rhonchi.  PLAN: I think the current degree of rate control is satisfactory and appropriate for her tenuous pulmonary condition. She is getting frequent bronchodilator therapy and HR waxes and wanes with these medications. No change in meds at this time. OK as long as average HR <110.  Thurmon Fair, MD, [facility] [phone] 08/01/2011, 9:55 AM

## 2011-08-01 NOTE — Progress Notes (Signed)
Patient ID: Lori [name]    ZOX:[id number]    DOB: Jun 17, 1920    DOA: 07/30/2011  PCP: Thayer Headings, MD, MD  Subjective:  HR controlled, still in Afib but very wheezy and bibasilar crackles, somewhat confused  Objective: Weight change:   Intake/Output Summary (Last 24 hours) at 08/01/11 0650 Last data filed at 08/01/11 0500  Gross per 24 hour  Intake 243.58 ml  Output    150 ml  Net  93.58 ml   Blood pressure 108/42, pulse 77, temperature 97.1 F (36.2 C), temperature source Axillary, resp. rate 17, height 5\' 3"  (1.6 m), weight 55.5 kg (122 lb 5.7 oz), last menstrual period 07/29/1960, SpO2 98.00%.  Physical Exam: General: resting, not in any acute distress. HEENT: anicteric sclera, pupils reactive to light and accommodation, EOMI CVS: ireg rhythm, S1-S2 clear, no murmur rubs or gallops Chest: B/L wheezing expiratory and crackles/rales Abdomen: soft nontender, nondistended, normal bowel sounds, no organomegaly Extremities: no cyanosis, clubbing or edema noted bilaterally   Lab Results: Basic Metabolic Panel:  Lab 08/01/11 8119 07/31/11 0440 07/30/11 1036  NA 133* 131* --  K 4.2 3.9 --  CL 98 97 --  CO2 24 24 --  GLUCOSE 129* 222* --  BUN 68* 47* --  CREATININE 1.80* 1.38* --  CALCIUM 9.4 9.2 --  MG -- -- 1.6  PHOS -- -- --  CBC:  Lab 08/01/11 0500 07/31/11 0440 07/30/11 0619  WBC 33.4* 24.5* --  NEUTROABS -- -- 5.9  HGB 10.4* 10.7* --  HCT 31.3* 32.1* --  MCV 84.8 84.7 --  PLT 210 187 --   Cardiac Enzymes:  Lab 08/01/11 0241 07/31/11 2101 07/31/11 1341  CKTOTAL 191* 175 196*  CKMB 12.2* 11.2* 11.4*  CKMBINDEX -- -- --  TROPONINI <0.30 <0.30 <0.30   BNP: 4092  CBG:  Lab 07/31/11 2120 07/31/11 1601 07/31/11 1155 07/31/11 0753 07/30/11 2132  GLUCAP 112* 184* 304* 218* 242*     Micro Results: Recent Results (from the past 240 hour(s))  URINE CULTURE     Status: Normal (Preliminary result)   Collection Time   07/30/11 12:36 PM      Component Value  Range Status Comment   Specimen Description URINE, RANDOM   Final    Special Requests NONE   Final    Culture  Setup Time [id number]   Final    Colony Count PENDING   Incomplete    Culture Culture reincubated for better growth   Final    Report Status PENDING   Incomplete   MRSA PCR SCREENING     Status: Normal   Collection Time   07/30/11  7:31 PM      Component Value Range Status Comment   MRSA by PCR NEGATIVE  NEGATIVE  Final     Studies/Results: Nm Pulmonary Perfusion  07/30/2011  *RADIOLOGY REPORT*  Clinical Data: Shortness of breath.  Hypertension.  Diabetes.  NM PULMONARY PERFUSION PARTICULATE  Radiopharmaceutical:  3 mCi technetium 99 MAA  Comparison: Chest radiograph of 07/30/2011  Findings: Very subtly reduced perfusion at the right lung bases significantly less than the radiographic abnormality. Due to limited mobility the patient was imaged with arms by her sides, which is also contributory. No additional perfusion defect observed. Given these findings, ventilation imaging was not performed.  IMPRESSION:  1.  Very low probability (0 - 9% risk) of pulmonary embolus.  Original Report Authenticated By: Dellia Cloud, M.D.   Dg Chest Port 1 View  07/30/2011  *RADIOLOGY  REPORT*  Clinical Data: Shortness of breath.  PORTABLE CHEST - 1 VIEW  Comparison: 08/21/2009 and 04/20/2007  Findings: The patient has chronic obstructive lung disease with chronic scarring at the right lung base.  Heart size and vascularity are normal.  No acute abnormalities.  No free air seen under the diaphragm.  No acute osseous abnormalities.  IMPRESSION: Chronic obstructive lung disease.  Scarring at the right lung base. No acute abnormalities.  Original Report Authenticated By: Gwynn Burly, M.D.    Medications: Scheduled Meds:    . budesonide  0.25 mg Nebulization BID  . cholecalciferol  1,000 Units Oral QODAY  . diltiazem  30 mg Oral QID  . furosemide  40 mg Intravenous Once  . guaiFENesin   600 mg Oral BID  . insulin aspart  0-20 Units Subcutaneous TID WC  . insulin aspart  0-5 Units Subcutaneous QHS  . insulin glargine  10 Units Subcutaneous QHS  . levalbuterol  0.63 mg Nebulization Q4H  . moxifloxacin  400 mg Intravenous Q24H  . mulitivitamin with minerals  1 tablet Oral Daily  . pantoprazole  40 mg Oral Q0600  . predniSONE  60 mg Oral Q breakfast  . sodium chloride  3 mL Intravenous Q12H  . DISCONTD: diltiazem (CARDIZEM) infusion  5-15 mg/hr Intravenous Once  . DISCONTD: insulin glargine  5 Units Subcutaneous QHS  . DISCONTD: levalbuterol  0.63 mg Nebulization Q6H  . DISCONTD: levothyroxine  25 mcg Oral Daily  . DISCONTD: methylPREDNISolone (SOLU-MEDROL) injection  60 mg Intravenous Q6H  . DISCONTD: methylPREDNISolone (SOLU-MEDROL) injection  40 mg Intravenous Q8H   Continuous Infusions:    . DISCONTD: diltiazem (CARDIZEM) infusion 5 mg/hr (07/31/11 1007)     Assessment/Plan:   .Atrial fibrillation with RVR: Known history of A. fib, triggered by COPD exacerbation: HR now controlled - changed to PO cardizem,  [facility] cardiology following - 2-D echo showed EF 65-70% with PHTN, mod TR likely causing RHF - She is not a Coumadin candidate from the previous records secondary to history of gastric AVMs and GI bleed in the past.  - D-dimer elevated, VQ scan showed very low probability for PE   .Hypotension: Likely secondary to A. fib with RVR and then Cardizem drip  - still BP borderline  .COPD exacerbation: still very wheezy - DC'ed solumedrol and chaged to prednisone - Continue scheduled Xopenex nebulizers, patient is allergic to Spiriva  - Cont Pulmicort, Avelox, mucinex and PPI  - I will discuss with Pulmonology today  .CHF, acute: Likely precipitated scheduled to rapid A. Fib, still sig pulm edema  - Appreciate cardiology recommendations, on Lasix, still room for lasix  - BNP elevated.  - TSH 0.435, supressed, T4/T3 in normal limits, given her confusion,  shakiness, Afib, I have stopped synthroid, she is only on . TSH can be re checked in 4-6 weeks to assess if she needs synthroid.  .Diabetes mellitus: uncontrolled now sec to steroids, HbA1C 6.8  - DC'd solumedrol, continue Lantus to 10units, continue resistant sliding scale insulin   .Hyperlipidemia  - lipid panel pending  .Peripheral neuropathy  - Continue tramadol when necessary   DVT prophylaxis: Bilateral SCDs   CODE STATUS:  DO NOT RESUSCITATE   Point of contact: Faiga Stones, phone # [phone]  Disposition: cont monitoring in SDU due to respiratory status     LOS: 2 days   Vadim Centola M.D. [facility] 08/01/2011, 6:50 AM Pager: [phone]

## 2011-08-02 LAB — GLUCOSE, CAPILLARY: Glucose-Capillary: 225 mg/dL — ABNORMAL HIGH (ref 70–99)

## 2011-08-02 LAB — HEMOGLOBIN A1C
Hgb A1c MFr Bld: 6.7 % — ABNORMAL HIGH (ref <5.7)
Mean Plasma Glucose: 146 mg/dL — ABNORMAL HIGH (ref <117)

## 2011-08-02 LAB — CBC
HCT: 29.9 % — ABNORMAL LOW (ref 36.0–46.0)
MCV: 84.7 fL (ref 78.0–100.0)
Platelets: 192 10*3/uL (ref 150–400)
RBC: 3.53 MIL/uL — ABNORMAL LOW (ref 3.87–5.11)
WBC: 27 10*3/uL — ABNORMAL HIGH (ref 4.0–10.5)

## 2011-08-02 LAB — BASIC METABOLIC PANEL
BUN: 67 mg/dL — ABNORMAL HIGH (ref 6–23)
CO2: 24 mEq/L (ref 19–32)
Chloride: 102 mEq/L (ref 96–112)
Creatinine, Ser: 1.55 mg/dL — ABNORMAL HIGH (ref 0.50–1.10)
Potassium: 4.4 mEq/L (ref 3.5–5.1)

## 2011-08-02 LAB — URINE CULTURE

## 2011-08-02 LAB — CARDIAC PANEL(CRET KIN+CKTOT+MB+TROPI)
CK, MB: 8.4 ng/mL (ref 0.3–4.0)
Troponin I: <0.3 ng/mL (ref <0.30)

## 2011-08-02 MED ORDER — INSULIN GLARGINE 100 UNIT/ML ~~LOC~~ SOLN
5.0000 [IU] | Freq: Every day | SUBCUTANEOUS | Status: DC
  Administered 2011-08-02: 5 [IU] via SUBCUTANEOUS

## 2011-08-02 MED ORDER — DILTIAZEM HCL 60 MG PO TABS
60.0000 mg | ORAL_TABLET | Freq: Three times a day (TID) | ORAL | Status: DC
  Administered 2011-08-02 – 2011-08-06 (×12): 60 mg via ORAL
  Filled 2011-08-02 (×15): qty 1

## 2011-08-02 MED ORDER — INSULIN ASPART 100 UNIT/ML ~~LOC~~ SOLN: 0.0000 [IU] | Freq: Every day | SUBCUTANEOUS | Status: DC

## 2011-08-02 MED ORDER — INSULIN ASPART 100 UNIT/ML ~~LOC~~ SOLN
0.0000 [IU] | Freq: Three times a day (TID) | SUBCUTANEOUS | Status: DC
  Administered 2011-08-02: 8 [IU] via SUBCUTANEOUS
  Administered 2011-08-02: 2 [IU] via SUBCUTANEOUS
  Administered 2011-08-02 – 2011-08-03 (×2): 5 [IU] via SUBCUTANEOUS
  Administered 2011-08-03: 11 [IU] via SUBCUTANEOUS
  Administered 2011-08-03 – 2011-08-04 (×2): 3 [IU] via SUBCUTANEOUS
  Administered 2011-08-04: 2 [IU] via SUBCUTANEOUS
  Administered 2011-08-04: 5 [IU] via SUBCUTANEOUS
  Administered 2011-08-05: 2 [IU] via SUBCUTANEOUS
  Administered 2011-08-05: 11 [IU] via SUBCUTANEOUS
  Administered 2011-08-05: 3 [IU] via SUBCUTANEOUS

## 2011-08-02 NOTE — Progress Notes (Signed)
Evening CBG at 2105 was 61.  Pt drank one carton of milk and CBG was rechecked at 2148 and was 108.  Called Lenny Pastel NP to notify of hypoglycemic event and to ask whether the 2200 dose of Lantus 10units should be given.  Order to hold tonight's dose. Will continue ongoing assessment for change in status.

## 2011-08-02 NOTE — Progress Notes (Signed)
Patient ID: Lori [name]    ZOX:[id number]    DOB: 05/09/1921    DOA: 07/30/2011  PCP: Thayer Headings, MD, MD  Subjective:  HR better controlled, still in Afib but still bibasilar crackles, SOB/wheezing improving Confusion appears to be improving  Objective: Weight change:   Intake/Output Summary (Last 24 hours) at 08/02/11 0647 Last data filed at 08/02/11 0015  Gross per 24 hour  Intake   1693 ml  Output    850 ml  Net    843 ml   Blood pressure 115/69, pulse 91, temperature 97.9 F (36.6 C), temperature source Oral, resp. rate 22, height 5\' 3"  (1.6 m), weight 54 kg (119 lb 0.8 oz), last menstrual period 07/29/1960, SpO2 94.00%.  Physical Exam: General: resting, not in any acute distress. HEENT: anicteric sclera, pupils reactive to light and accommodation, EOMI CVS: ireg rhythm, S1-S2 clear, no murmur rubs or gallops Chest: B/L and crackles/rales, scattered wheezing Abdomen: soft nontender, nondistended, normal bowel sounds, no organomegaly Extremities: no cyanosis, clubbing or edema noted bilaterally   Lab Results: Basic Metabolic Panel:  Lab 08/02/11 8119 08/01/11 0500 07/30/11 1036  NA 133* 133* --  K 4.4 4.2 --  CL 102 98 --  CO2 24 24 --  GLUCOSE 114* 129* --  BUN 67* 68* --  CREATININE 1.55* 1.80* --  CALCIUM 9.1 9.4 --  MG -- -- 1.6  PHOS -- -- --  CBC:  Lab 08/02/11 0138 08/01/11 0500 07/30/11 0619  WBC 27.0* 33.4* --  NEUTROABS -- -- 5.9  HGB 9.9* 10.4* --  HCT 29.9* 31.3* --  MCV 84.7 84.8 --  PLT 192 210 --   Cardiac Enzymes:  Lab 08/02/11 0500 08/01/11 1844 08/01/11 1336  CKTOTAL 189* 230* 276*  CKMB 8.4* 10.9* 13.5*  CKMBINDEX -- -- --  TROPONINI <0.30 <0.30 <0.30   BNP: 4092  CBG:  Lab 08/01/11 2148 08/01/11 2105 08/01/11 1726 08/01/11 1245 08/01/11 0840  GLUCAP 108* 61* 267* 188* 143*     Micro Results: Recent Results (from the past 240 hour(s))  URINE CULTURE     Status: Normal (Preliminary result)   Collection Time   07/30/11  12:36 PM      Component Value Range Status Comment   Specimen Description URINE, RANDOM   Final    Special Requests NONE   Final    Culture  Setup Time [id number]   Final    Colony Count 15,000 COLONIES/ML   Final    Culture ENTEROCOCCUS SPECIES   Final    Report Status PENDING   Incomplete   MRSA PCR SCREENING     Status: Normal   Collection Time   07/30/11  7:31 PM      Component Value Range Status Comment   MRSA by PCR NEGATIVE  NEGATIVE  Final     Studies/Results: Nm Pulmonary Perfusion  07/30/2011  *RADIOLOGY REPORT*  Clinical Data: Shortness of breath.  Hypertension.  Diabetes.  NM PULMONARY PERFUSION PARTICULATE  Radiopharmaceutical:  3 mCi technetium 99 MAA  Comparison: Chest radiograph of 07/30/2011  Findings: Very subtly reduced perfusion at the right lung bases significantly less than the radiographic abnormality. Due to limited mobility the patient was imaged with arms by her sides, which is also contributory. No additional perfusion defect observed. Given these findings, ventilation imaging was not performed.  IMPRESSION:  1.  Very low probability (0 - 9% risk) of pulmonary embolus.  Original Report Authenticated By: Dellia Cloud, M.D.   Dg Chest [facility]  1 View  07/30/2011  *RADIOLOGY REPORT*  Clinical Data: Shortness of breath.  PORTABLE CHEST - 1 VIEW  Comparison: 08/21/2009 and 04/20/2007  Findings: The patient has chronic obstructive lung disease with chronic scarring at the right lung base.  Heart size and vascularity are normal.  No acute abnormalities.  No free air seen under the diaphragm.  No acute osseous abnormalities.  IMPRESSION: Chronic obstructive lung disease.  Scarring at the right lung base. No acute abnormalities.  Original Report Authenticated By: Gwynn Burly, M.D.    Medications: Scheduled Meds:    . budesonide  0.25 mg Nebulization BID  . cholecalciferol  1,000 Units Oral QODAY  . diltiazem  30 mg Oral QID  . guaiFENesin  600 mg Oral BID  .  insulin aspart  0-20 Units Subcutaneous TID WC  . insulin aspart  0-5 Units Subcutaneous QHS  . insulin glargine  10 Units Subcutaneous QHS  . levalbuterol  0.63 mg Nebulization TID  . moxifloxacin  400 mg Intravenous Q24H  . mulitivitamin with minerals  1 tablet Oral Daily  . pantoprazole  40 mg Oral Q0600  . predniSONE  60 mg Oral Q breakfast  . sodium chloride  3 mL Intravenous Q12H  . DISCONTD: diltiazem (CARDIZEM) infusion  5-15 mg/hr Intravenous Once  . DISCONTD: levalbuterol  0.63 mg Nebulization Q4H   Continuous Infusions:    . DISCONTD: diltiazem (CARDIZEM) infusion 5 mg/hr (07/31/11 1007)     Assessment/Plan:   .Atrial fibrillation with RVR: Known history of A. fib, triggered by COPD exacerbation: HR now controlled - changed to PO cardizem,  [facility] cardiology following - 2-D echo showed EF 65-70% with PHTN, mod TR likely causing RHF - She is not a Coumadin candidate from the previous records secondary to history of gastric AVMs and GI bleed in the past.  - D-dimer elevated, VQ scan showed very low probability for PE   .Hypotension: Likely secondary to A. fib with RVR and then Cardizem drip  - still BP borderline makes it difficult for titration of Lasix  .COPD exacerbation: Improving -continue prednisone, will start taper tomorrow if wheezing improving still  - Continue scheduled Xopenex nebulizers, patient is allergic to Spiriva  - Cont Pulmicort, Avelox, mucinex and PPI   .CHF, acute: Likely precipitated scheduled to rapid A. Fib, still sig pulm edema  - Appreciate cardiology recommendations, on Lasix, still room for lasix  - BNP elevated.  - TSH 0.435, supressed, T4/T3 in normal limits, given her confusion, shakiness, Afib, I have stopped synthroid, she is only on . TSH can be re checked in 4-6 weeks to assess if she needs synthroid.  .Diabetes mellitus: uncontrolled now sec to steroids, HbA1C 6.8 but overnight had BS 61  - decrease Lantus to 5units, change  to moderate sliding scale insulin   .Hyperlipidemia   .Peripheral neuropathy  - Continue tramadol when necessary   Urinary tract infection: Urine culture shows only 15K colonies of enterococcus - await sensitivities  Anemia: Appears to have iron deficiency anemia - Stool occult blood test pending, outpatient GI workup patient is not actively having any GI bleed here    DVT prophylaxis: Bilateral SCDs   CODE STATUS:  DO NOT RESUSCITATE   Point of contact: Yazlynn Birkeland, phone # [phone]  Disposition: will check with cardiology if okay for tele floor    LOS: 3 days   Cavan Bearden M.D. [facility] 08/02/2011, 6:47 AM Pager: [phone]

## 2011-08-02 NOTE — Progress Notes (Signed)
Subjective: No chest pain.  Objective: Vital signs in last 24 hours: Temp:  [97.2 F (36.2 C)-98.3 F (36.8 C)] 97.6 F (36.4 C) (03/03 0800) Pulse Rate:  [85-142] 95  (03/03 0800) Resp:  [19-27] 23  (03/03 0800) BP: (93-129)/(33-89) 129/50 mmHg (03/03 0800) SpO2:  [86 %-98 %] 98 % (03/03 0800) Weight:  [54 kg (119 lb 0.8 oz)] 54 kg (119 lb 0.8 oz) (03/03 0500) Weight change:  Last BM Date: 07/29/11 Intake/Output from previous day:+ 843 03/02 0701 - 03/03 0700 In: 1693 [P.O.:1440; I.V.:3; IV Piggyback:250] Out: 1075 [Urine:1075] Intake/Output this shift: Total I/O In: -  Out: 150 [Urine:150]  PE: General: Heart: Lungs: Abd: Ext: Neuro:   Lab Results:  Basename 08/02/11 0138 08/01/11 0500  WBC 27.0* 33.4*  HGB 9.9* 10.4*  HCT 29.9* 31.3*  PLT 192 210   BMET  Basename 08/02/11 0138 08/01/11 0500  NA 133* 133*  K 4.4 4.2  CL 102 98  CO2 24 24  GLUCOSE 114* 129*  BUN 67* 68*  CREATININE 1.55* 1.80*  CALCIUM 9.1 9.4    Basename 08/02/11 0500 08/01/11 1844  TROPONINI <0.30 <0.30    No results found for this basename: CHOL, HDL, LDLCALC, LDLDIRECT, TRIG, CHOLHDL   Lab Results  Component Value Date   HGBA1C 6.8* 07/30/2011     Lab Results  Component Value Date   TSH 0.435 07/30/2011    Hepatic Function Panel No results found for this basename: PROT,ALBUMIN,AST,ALT,ALKPHOS,BILITOT,BILIDIR,IBILI in the last 72 hours No results found for this basename: CHOL in the last 72 hours No results found for this basename: PROTIME in the last 72 hours    EKG: Orders placed during the hospital encounter of 07/30/11  . ED EKG  . ED EKG    Studies/Results: No results found.  Medications: I have reviewed the patient's current medications.    . budesonide  0.25 mg Nebulization BID  . cholecalciferol  1,000 Units Oral QODAY  . diltiazem  30 mg Oral QID  . guaiFENesin  600 mg Oral BID  . insulin aspart  0-15 Units Subcutaneous TID WC  . insulin aspart  0-5  Units Subcutaneous QHS  . insulin glargine  5 Units Subcutaneous QHS  . levalbuterol  0.63 mg Nebulization TID  . moxifloxacin  400 mg Intravenous Q24H  . mulitivitamin with minerals  1 tablet Oral Daily  . pantoprazole  40 mg Oral Q0600  . predniSONE  60 mg Oral Q breakfast  . sodium chloride  3 mL Intravenous Q12H  . DISCONTD: diltiazem (CARDIZEM) infusion  5-15 mg/hr Intravenous Once  . DISCONTD: insulin aspart  0-20 Units Subcutaneous TID WC  . DISCONTD: insulin aspart  0-5 Units Subcutaneous QHS  . DISCONTD: insulin glargine  10 Units Subcutaneous QHS  . DISCONTD: levalbuterol  0.63 mg Nebulization Q4H   Assessment/Plan: Patient Active Problem List  Diagnoses  . Atrial fibrillation with RVR  . Hypotension  . COPD exacerbation  . CHF, acute  . Diabetes mellitus  . Hyperlipidemia  . Peripheral neuropathy   PLAN: A. Fib continues, though improved rate.    LOS: 3 days   INGOLD,LAURA R 08/02/2011, 9:14 AM   I have seen and examined the patient along with [facility] R, NP.  I have reviewed the chart, notes and new data.  I agree with NP's note.  Key new complaints: breathing a little better; still has a productive cough Key examination changes: average HR is 90-105, atrial fibrillation; only occasionally true RVR when  she has a bad coughing spell. Key new findings / data: creat a little bette  PLAN: Changed diltiazem to 60 mg TID for easier scheduling and a little better AV node blockade. BP precludes a major dose escalation. Beta blockers and digoxin are relatively contraindicated. History of severe GI bleeding with anticoagulants and even with aspirin.  I think a resting HR <110 is acceptable.  Will sign off, but please reconsult if new issues or questions arise.  Thurmon Fair, MD, [facility] [phone] 08/02/2011, 9:28 AM

## 2011-08-02 NOTE — Progress Notes (Signed)
Awaiting ALF to determine if appropriate to return at d/c. Message left at ALF regarding this and will have weekday CSW f/u as well. Reece Levy, MSW, [facility] [phone]

## 2011-08-03 LAB — GLUCOSE, CAPILLARY
Glucose-Capillary: 212 mg/dL — ABNORMAL HIGH (ref 70–99)
Glucose-Capillary: 304 mg/dL — ABNORMAL HIGH (ref 70–99)
Glucose-Capillary: 243 mg/dL — ABNORMAL HIGH (ref 70–99)

## 2011-08-03 LAB — CBC
MCV: 85.6 fL (ref 78.0–100.0)
Platelets: 207 10*3/uL (ref 150–400)
RDW: 13.4 % (ref 11.5–15.5)
WBC: 19.9 10*3/uL — ABNORMAL HIGH (ref 4.0–10.5)

## 2011-08-03 LAB — BASIC METABOLIC PANEL
Chloride: 107 mEq/L (ref 96–112)
Creatinine, Ser: 1.23 mg/dL — ABNORMAL HIGH (ref 0.50–1.10)
GFR calc Af Amer: 44 mL/min — ABNORMAL LOW (ref >90)

## 2011-08-03 LAB — CARDIAC PANEL(CRET KIN+CKTOT+MB+TROPI)
Relative Index: INVALID (ref 0.0–2.5)
Total CK: 79 U/L (ref 7–177)

## 2011-08-03 MED ORDER — INSULIN GLARGINE 100 UNIT/ML ~~LOC~~ SOLN
10.0000 [IU] | Freq: Every day | SUBCUTANEOUS | Status: DC
  Administered 2011-08-03 – 2011-08-05 (×3): 10 [IU] via SUBCUTANEOUS

## 2011-08-03 MED ORDER — MOXIFLOXACIN HCL 400 MG PO TABS
400.0000 mg | ORAL_TABLET | Freq: Every day | ORAL | Status: DC
  Filled 2011-08-03: qty 1

## 2011-08-03 MED ORDER — PREDNISONE 20 MG PO TABS
40.0000 mg | ORAL_TABLET | Freq: Every day | ORAL | Status: DC
  Administered 2011-08-03 – 2011-08-05 (×3): 40 mg via ORAL
  Filled 2011-08-03 (×4): qty 2

## 2011-08-03 MED ORDER — LEVOFLOXACIN 500 MG PO TABS
500.0000 mg | ORAL_TABLET | Freq: Every day | ORAL | Status: DC
  Administered 2011-08-03 – 2011-08-05 (×3): 500 mg via ORAL
  Filled 2011-08-03 (×3): qty 1

## 2011-08-03 MED ORDER — FUROSEMIDE 10 MG/ML IJ SOLN
20.0000 mg | Freq: Every day | INTRAMUSCULAR | Status: DC
  Administered 2011-08-03 – 2011-08-05 (×3): 20 mg via INTRAVENOUS
  Filled 2011-08-03 (×3): qty 2

## 2011-08-03 NOTE — Progress Notes (Signed)
Patient ID: Lori [name]    RUE:[id number]    DOB: 1920-09-25    DOA: 07/30/2011  PCP: Thayer Headings, MD, MD  Subjective:  HR better controlled, still in Afib but still bibasilar crackles  Confusion appears to be improving  Objective: Weight change: -0.5 kg (-1 lb 1.6 oz)  Intake/Output Summary (Last 24 hours) at 08/03/11 0755 Last data filed at 08/03/11 0500  Gross per 24 hour  Intake    243 ml  Output   1200 ml  Net   -957 ml   Blood pressure 151/62, pulse 89, temperature 98 F (36.7 C), temperature source Oral, resp. rate 25, height 5\' 3"  (1.6 m), weight 53.5 kg (117 lb 15.1 oz), last menstrual period 07/29/1960, SpO2 98.00%.  Physical Exam: General: resting, not in any acute distress. HEENT: anicteric sclera, pupils reactive to light and accommodation, EOMI CVS: ireg rhythm, S1-S2 clear, no murmur rubs or gallops Chest: B/L and crackles/rales, scattered wheezing Abdomen: soft nontender, nondistended, normal bowel sounds, no organomegaly Extremities: no cyanosis, clubbing or edema noted bilaterally   Lab Results: Basic Metabolic Panel:  Lab 08/03/11 1478 08/02/11 0138 07/30/11 1036  NA 140 133* --  K 4.1 4.4 --  CL 107 102 --  CO2 27 24 --  GLUCOSE 212* 114* --  BUN 47* 67* --  CREATININE 1.23* 1.55* --  CALCIUM 9.4 9.1 --  MG -- -- 1.6  PHOS -- -- --  CBC:  Lab 08/03/11 0500 08/02/11 0138 07/30/11 0619  WBC 19.9* 27.0* --  NEUTROABS -- -- 5.9  HGB 11.1* 9.9* --  HCT 33.2* 29.9* --  MCV 85.6 84.7 --  PLT 207 192 --   Cardiac Enzymes:  Lab 08/03/11 0500 08/02/11 0500 08/01/11 1844  CKTOTAL 79 189* 230*  CKMB 5.0* 8.4* 10.9*  CKMBINDEX -- -- --  TROPONINI <0.30 <0.30 <0.30   BNP: 4092  CBG:  Lab 08/03/11 0459 08/02/11 2147 08/02/11 2145 08/02/11 1536 08/02/11 1207  GLUCAP 212* 335* 408* 225* 297*     Micro Results: Recent Results (from the past 240 hour(s))  URINE CULTURE     Status: Normal   Collection Time   07/30/11 12:36 PM       Component Value Range Status Comment   Specimen Description URINE, RANDOM   Final    Special Requests NONE   Final    Culture  Setup Time [id number]   Final    Colony Count 15,000 COLONIES/ML   Final    Culture ENTEROCOCCUS SPECIES   Final    Report Status 08/02/2011 FINAL   Final    Organism ID, Bacteria ENTEROCOCCUS SPECIES   Final   MRSA PCR SCREENING     Status: Normal   Collection Time   07/30/11  7:31 PM      Component Value Range Status Comment   MRSA by PCR NEGATIVE  NEGATIVE  Final     Studies/Results: Nm Pulmonary Perfusion  07/30/2011  *RADIOLOGY REPORT*  Clinical Data: Shortness of breath.  Hypertension.  Diabetes.  NM PULMONARY PERFUSION PARTICULATE  Radiopharmaceutical:  3 mCi technetium 99 MAA  Comparison: Chest radiograph of 07/30/2011  Findings: Very subtly reduced perfusion at the right lung bases significantly less than the radiographic abnormality. Due to limited mobility the patient was imaged with arms by her sides, which is also contributory. No additional perfusion defect observed. Given these findings, ventilation imaging was not performed.  IMPRESSION:  1.  Very low probability (0 - 9% risk) of pulmonary embolus.  Original Report Authenticated By: Dellia Cloud, M.D.   Dg Chest Port 1 View  07/30/2011  *RADIOLOGY REPORT*  Clinical Data: Shortness of breath.  PORTABLE CHEST - 1 VIEW  Comparison: 08/21/2009 and 04/20/2007  Findings: The patient has chronic obstructive lung disease with chronic scarring at the right lung base.  Heart size and vascularity are normal.  No acute abnormalities.  No free air seen under the diaphragm.  No acute osseous abnormalities.  IMPRESSION: Chronic obstructive lung disease.  Scarring at the right lung base. No acute abnormalities.  Original Report Authenticated By: Gwynn Burly, M.D.    Medications: Scheduled Meds:    . budesonide  0.25 mg Nebulization BID  . cholecalciferol  1,000 Units Oral QODAY  . diltiazem  60 mg  Oral Q8H  . furosemide  20 mg Intravenous Daily  . guaiFENesin  600 mg Oral BID  . insulin aspart  0-15 Units Subcutaneous TID WC  . insulin aspart  0-5 Units Subcutaneous QHS  . insulin glargine  10 Units Subcutaneous QHS  . levalbuterol  0.63 mg Nebulization TID  . levofloxacin  500 mg Oral Daily  . mulitivitamin with minerals  1 tablet Oral Daily  . pantoprazole  40 mg Oral Q0600  . predniSONE  40 mg Oral Q breakfast  . sodium chloride  3 mL Intravenous Q12H  . DISCONTD: diltiazem (CARDIZEM) infusion  5-15 mg/hr Intravenous Once  . DISCONTD: diltiazem  30 mg Oral QID  . DISCONTD: insulin glargine  5 Units Subcutaneous QHS  . DISCONTD: moxifloxacin  400 mg Intravenous Q24H  . DISCONTD: moxifloxacin  400 mg Oral Daily  . DISCONTD: predniSONE  60 mg Oral Q breakfast   Continuous Infusions:     Assessment/Plan:   .Atrial fibrillation with RVR: Known history of A. fib, triggered by COPD exacerbation: HR now controlled - changed to PO cardizem, titrated up by  [facility] cardiology  - 2-D echo showed EF 65-70% with PHTN, mod TR likely causing RHF - She is not a Coumadin candidate from the previous records secondary to history of gastric AVMs and GI bleed in the past.  - D-dimer elevated, VQ scan showed very low probability for PE   .Hypotension: Likely secondary to A. fib with RVR and then Cardizem drip  - still BP borderline makes it difficult for titration of Lasix  .COPD exacerbation: Improving -continue prednisone, tapered to 40mg  today  - Continue scheduled Xopenex nebulizers, patient is allergic to Spiriva  - Cont Pulmicort, mucinex and PPI, changed to Levaquin to cover for enterococcus UTI  Enterococcus UTI: placed on levaquin per pharmacy rec's   .CHF, acute: Likely precipitated scheduled to rapid A. Fib, still sig pulm edema/crackles  - Appreciate cardiology recommendations, on Lasix, still room for lasix - Added lasix 20mg  IV daily as BP allows - TSH 0.435, supressed,  T4/T3 in normal limits, given her confusion, shakiness, Afib, I have stopped synthroid, she is only on . TSH can be re checked in 4-6 weeks to assess if she needs synthroid.  .Diabetes mellitus: uncontrolled now sec to steroids, HbA1C 6.8  - inc Lantus to 10 units, change to resistant sliding scale insulin   .Hyperlipidemia   .Peripheral neuropathy  - Continue tramadol when necessary   Anemia: Appears to have iron deficiency anemia - Stool occult blood test pending, outpatient GI workup patient is not actively having any GI bleed here    DVT prophylaxis: Bilateral SCDs   CODE STATUS:  DO NOT RESUSCITATE  Point of contact: Zakhia Seres, phone # [phone]  Disposition: transfer to tele, PT/OT eval, may need SNF (currently at ALF)      LOS: 4 days   Colburn Asper M.D. [facility] 08/03/2011, 7:55 AM Pager: [phone]

## 2011-08-03 NOTE — Progress Notes (Signed)
Physical Therapy Treatment Patient Details Name: Lori [name] MRN: [id number] DOB: [birth year] Today's Date: 08/03/2011  PT Assessment/Plan  PT - Assessment/Plan Comments on Treatment Session: Pt progressing well with activity and ambulation. Pt more alert and with increased transfers today as well. If pt has increased supervision at ALF return with initial HHPT would be a good plan. PT Plan: Discharge plan needs to be updated;Frequency remains appropriate PT Frequency: Min 3X/week Follow Up Recommendations: Home health PT;Supervision for mobility/OOB Equipment Recommended: None recommended by PT PT Goals  Acute Rehab PT Goals PT Goal: Supine/Side to Sit - Progress: Met PT Goal: Sit to Stand - Progress: Progressing toward goal PT Goal: Stand to Sit - Progress: Met PT Transfer Goal: Bed to Chair/Chair to Bed - Progress: Progressing toward goal Pt will Ambulate: >150 feet;with modified independence;with rolling walker PT Goal: Ambulate - Progress: Updated due to goal met  PT Treatment Precautions/Restrictions  Precautions Precautions: Fall Precaution Comments: O2 Required Braces or Orthoses: No Restrictions Weight Bearing Restrictions: No Mobility (including Balance) Bed Mobility Bed Mobility: Yes Supine to Sit: 6: Modified independent (Device/Increase time);HOB flat;With rails Sitting - Scoot to Edge of Bed: 6: Modified independent (Device/Increase time) Transfers Sit to Stand: 5: Supervision;From bed Sit to Stand Details (indicate cue type and reason): cues for hand placement Stand to Sit: 6: Modified independent (Device/Increase time);To chair/3-in-1;With armrests Ambulation/Gait Ambulation/Gait Assistance: 5: Supervision Ambulation/Gait Assistance Details (indicate cue type and reason): VC for directions to room and initial cueing to step into RW but pt maintained proper position after initial education Ambulation Distance (Feet): 200 Feet Assistive device: Rolling  walker Gait Pattern: Decreased stride length  Posture/Postural Control Posture/Postural Control: Postural limitations Exercise  General Exercises - Lower Extremity Long Arc Quad: AROM;20 reps;Both;Seated Hip Flexion/Marching: AROM;Both;Seated;Other reps (comment) Lori [name]) End of Session PT - End of Session Equipment Utilized During Treatment: Gait belt Activity Tolerance: Patient tolerated treatment well Patient left: in chair;with call bell in reach Nurse Communication: Mobility status for transfers;Mobility status for ambulation General Behavior During Session: Lori [name] for tasks performed Cognition: Impaired  Lori [name] 08/03/2011, 4:14 PM Toney Sang, PT [phone]

## 2011-08-03 NOTE — Progress Notes (Signed)
Clinical Social Worker received a call from pt son inquiring as to whether pt will be able to return to ALF [facility]. CSW informed pt that CSW contacted [facility] Friday whether pt is able to return. CSW observed that pt may need SNF placement pending today PT note. CSW will follow once update provided.  Theresia Bough, MSW, [facility] [phone]

## 2011-08-04 ENCOUNTER — Encounter (HOSPITAL_COMMUNITY): Payer: Self-pay

## 2011-08-04 LAB — CARDIAC PANEL(CRET KIN+CKTOT+MB+TROPI): Troponin I: <0.3 ng/mL (ref <0.30)

## 2011-08-04 LAB — BASIC METABOLIC PANEL
BUN: 36 mg/dL — ABNORMAL HIGH (ref 6–23)
CO2: 27 mEq/L (ref 19–32)
Chloride: 105 mEq/L (ref 96–112)
GFR calc Af Amer: 57 mL/min — ABNORMAL LOW (ref >90)
Glucose, Bld: 179 mg/dL — ABNORMAL HIGH (ref 70–99)
Potassium: 4 mEq/L (ref 3.5–5.1)

## 2011-08-04 LAB — GLUCOSE, CAPILLARY
Glucose-Capillary: 180 mg/dL — ABNORMAL HIGH (ref 70–99)
Glucose-Capillary: 201 mg/dL — ABNORMAL HIGH (ref 70–99)

## 2011-08-04 NOTE — Progress Notes (Signed)
Patient ID: Lori [name]    WUJ:[id number]    DOB: Apr 03, 1921    DOA: 07/30/2011  PCP: Thayer Headings, MD, MD  Interval summary  Briefly patient is 78 year old female with history of diabetes mellitus, HTN, A. fib, hyperlipidemia, history of gastric AVMs and GI bleed in the past, not considered a Coumadin candidate due to GI bleed/gastric AVMs was sent from nursing home for shortness of breath and wheezing. In the ED, patient was noted to be in rapid A. fib with heart rate of 145 and wheezing in all lung fields. Patient was started on Cardizem drip, she became somewhat hypotensive with SBP in 70s-80's. Cardiology was consulted and patient was admitted to step down unit. VQ scan was negative for any acute PE. Patient was placed on scheduled nebulizer treatments, antibiotics, O2 supplementation, steroids. It was a difficult situation to place her on Cardizem and IV diuresis and uptitrate secondary to borderline hypotension. Cardizem was increased by cardiology and transitioned to PO, so far she is tolerating the higher dose. Patient was transferred to monitored floor on 08/03/2011. She was also placed on low dose of IV Lasix at 20 mg and appears to be tolerating this well. PT eval was done and recommended home health PT at ALF. Hopefully patient can be DC'd in 24-48 hours and transitioned to by mouth Lasix,   Subjective:  HR better controlled, still in Afib, SOB significantly improved today   Objective: Weight change:   Intake/Output Summary (Last 24 hours) at 08/04/11 1628 Last data filed at 08/04/11 1600  Gross per 24 hour  Intake    480 ml  Output   1950 ml  Net  -1470 ml   Blood pressure 138/78, pulse 110, temperature 98.4 F (36.9 C), temperature source Oral, resp. rate 18, height 5\' 3"  (1.6 m), weight 53.5 kg (117 lb 15.1 oz), last menstrual period 07/29/1960, SpO2 95.00%.  Physical Exam: General: resting, not in any acute distress. HEENT: anicteric sclera, pupils reactive to light and  accommodation, EOMI CVS: ireg rhythm, S1-S2 clear, no murmur rubs or gallops Chest: Minimal crackles, no wheezing  Abdomen: soft nontender, nondistended, normal bowel sounds, no organomegaly Extremities: no cyanosis, clubbing or edema noted bilaterally   Lab Results: Basic Metabolic Panel:  Lab 08/04/11 9562 08/03/11 0500 07/30/11 1036  NA 140 140 --  K 4.0 4.1 --  CL 105 107 --  CO2 27 27 --  GLUCOSE 179* 212* --  BUN 36* 47* --  CREATININE 0.99 1.23* --  CALCIUM 9.3 9.4 --  MG -- -- 1.6  PHOS -- -- --  CBC:  Lab 08/03/11 0500 08/02/11 0138 07/30/11 0619  WBC 19.9* 27.0* --  NEUTROABS -- -- 5.9  HGB 11.1* 9.9* --  HCT 33.2* 29.9* --  MCV 85.6 84.7 --  PLT 207 192 --   Cardiac Enzymes:  Lab 08/04/11 0600 08/03/11 0500 08/02/11 0500  CKTOTAL 53 79 189*  CKMB 3.1 5.0* 8.4*  CKMBINDEX -- -- --  TROPONINI <0.30 <0.30 <0.30   BNP: 4092  CBG:  Lab 08/04/11 1620 08/04/11 1111 08/04/11 0626 08/03/11 2137 08/03/11 1617  GLUCAP 130* 223* 175* 95 243*     Micro Results: Recent Results (from the past 240 hour(s))  URINE CULTURE     Status: Normal   Collection Time   07/30/11 12:36 PM      Component Value Range Status Comment   Specimen Description URINE, RANDOM   Final    Special Requests NONE   Final  Culture  Setup Time [id number]   Final    Colony Count 15,000 COLONIES/ML   Final    Culture ENTEROCOCCUS SPECIES   Final    Report Status 08/02/2011 FINAL   Final    Organism ID, Bacteria ENTEROCOCCUS SPECIES   Final   MRSA PCR SCREENING     Status: Normal   Collection Time   07/30/11  7:31 PM      Component Value Range Status Comment   MRSA by PCR NEGATIVE  NEGATIVE  Final     Studies/Results: Nm Pulmonary Perfusion  07/30/2011  *RADIOLOGY REPORT*  Clinical Data: Shortness of breath.  Hypertension.  Diabetes.  NM PULMONARY PERFUSION PARTICULATE  Radiopharmaceutical:  3 mCi technetium 99 MAA  Comparison: Chest radiograph of 07/30/2011  Findings: Very subtly  reduced perfusion at the right lung bases significantly less than the radiographic abnormality. Due to limited mobility the patient was imaged with arms by her sides, which is also contributory. No additional perfusion defect observed. Given these findings, ventilation imaging was not performed.  IMPRESSION:  1.  Very low probability (0 - 9% risk) of pulmonary embolus.  Original Report Authenticated By: Dellia Cloud, M.D.   Dg Chest Port 1 View  07/30/2011  *RADIOLOGY REPORT*  Clinical Data: Shortness of breath.  PORTABLE CHEST - 1 VIEW  Comparison: 08/21/2009 and 04/20/2007  Findings: The patient has chronic obstructive lung disease with chronic scarring at the right lung base.  Heart size and vascularity are normal.  No acute abnormalities.  No free air seen under the diaphragm.  No acute osseous abnormalities.  IMPRESSION: Chronic obstructive lung disease.  Scarring at the right lung base. No acute abnormalities.  Original Report Authenticated By: Gwynn Burly, M.D.    Medications: Scheduled Meds:    . budesonide  0.25 mg Nebulization BID  . cholecalciferol  1,000 Units Oral QODAY  . diltiazem  60 mg Oral Q8H  . furosemide  20 mg Intravenous Daily  . guaiFENesin  600 mg Oral BID  . insulin aspart  0-15 Units Subcutaneous TID WC  . insulin aspart  0-5 Units Subcutaneous QHS  . insulin glargine  10 Units Subcutaneous QHS  . levalbuterol  0.63 mg Nebulization TID  . levofloxacin  500 mg Oral Daily  . mulitivitamin with minerals  1 tablet Oral Daily  . pantoprazole  40 mg Oral Q0600  . predniSONE  40 mg Oral Q breakfast  . sodium chloride  3 mL Intravenous Q12H  . DISCONTD: diltiazem (CARDIZEM) infusion  5-15 mg/hr Intravenous Once   Continuous Infusions:     Assessment/Plan:   .Atrial fibrillation with RVR: Known history of A. fib, triggered by COPD exacerbation: HR now controlled - changed to PO cardizem, titrated up by  [facility] cardiology  - 2-D echo showed EF 65-70%  with PHTN, mod TR likely causing RHF - She is not a Coumadin candidate from the previous records secondary to history of gastric AVMs and GI bleed in the past.  - D-dimer elevated, VQ scan showed very low probability for PE   .Hypotension: Likely secondary to A. fib with RVR and then Cardizem drip  - On 20 mg IV Lasix, so far BP stable   .COPD exacerbation: Improving -continue prednisone, scheduled Xopenex nebulizers, patient is allergic to Spiriva  - Cont Pulmicort, mucinex and PPI, changed to Levaquin to cover for enterococcus UTI  Enterococcus UTI: placed on levaquin per pharmacy rec's   .CHF, acute: Likely precipitated scheduled to rapid A. Fib-  Appreciate cardiology recommendations, on Lasix, still room for lasix - Continue lasix 20mg  IV daily - TSH 0.435, supressed, T4/T3 in normal limits, given her confusion, shakiness, Afib, I have stopped synthroid, she is only on . TSH can be re checked in 4-6 weeks to assess if she needs synthroid.  .Diabetes mellitus: uncontrolled now sec to steroids, HbA1C 6.8  - inc Lantus to 10 units, change to resistant sliding scale insulin,  .Hyperlipidemia   .Peripheral neuropathy  - Continue tramadol when necessary   Anemia: Appears to have iron deficiency anemia, H&H stable  DVT prophylaxis: Bilateral SCDs   CODE STATUS:  DO NOT RESUSCITATE   Point of contact: Lori [name], phone # [phone]  Disposition: Hopefully DC in 24-48 hours.   LOS: 5 days   Lori [name] M.D. [facility] 08/04/2011, 4:28 PM Pager: [phone]

## 2011-08-04 NOTE — Progress Notes (Signed)
OT Cancellation Note  Treatment cancelled today due to patient's refusal to participate: patient had just finished working with PT and requests that OT return "a little later" as she is "give out" at this time. Unable to return later this evening, but will attempt to check on patient tomorrow as schedule allows. Thank you!  Glendale Chard, OTR/L Pager: [phone] 08/04/2011    Yohana Bartha 08/04/2011, 3:08 PM

## 2011-08-04 NOTE — Progress Notes (Signed)
Physical Therapy Treatment Patient Details Name: Lori [name] MRN: [id number] DOB: [birth year] Today's Date: 08/04/2011  PT Assessment/Plan  PT - Assessment/Plan Comments on Treatment Session: Pt with excellent progression with ambulation today. Pt stood at sink to brush hair and teeth without assist for balance before ambulation. Pt encouraged to ask nursing to walk with her so she could mobilize more. PT Plan: Discharge plan remains appropriate Follow Up Recommendations: Home health PT;Supervision for mobility/OOB PT Goals  Acute Rehab PT Goals PT Goal: Sit to Stand - Progress: Progressing toward goal PT Transfer Goal: Bed to Chair/Chair to Bed - Progress: Progressing toward goal PT Goal: Ambulate - Progress: Progressing toward goal  PT Treatment Precautions/Restrictions  Precautions Precautions: Fall Precaution Comments: O2 Required Braces or Orthoses: No Restrictions Weight Bearing Restrictions: No Mobility (including Balance) Bed Mobility Supine to Sit: 4: Min assist;HOB flat Supine to Sit Details (indicate cue type and reason): pt reaching for PT rather than following cues to use rail Sitting - Scoot to Edge of Bed: 6: Modified independent (Device/Increase time) Transfers Sit to Stand: 5: Supervision;From bed Sit to Stand Details (indicate cue type and reason): cues for hand placement Stand to Sit: 6: Modified independent (Device/Increase time);To chair/3-in-1 Ambulation/Gait Ambulation/Gait Assistance: 5: Supervision Ambulation/Gait Assistance Details (indicate cue type and reason): VC for direction to room only Ambulation Distance (Feet): 550 Feet Assistive device: Rolling walker Gait Pattern: Within Functional Limits Gait velocity: decreased    Exercise  General Exercises - Lower Extremity Long Arc Quad: AROM;Both;20 reps Hip Flexion/Marching: AROM;Both;Seated;Other reps (comment) [name] Activity Tolerance: Patient tolerated  treatment well Patient left: in chair;with call bell in reach Nurse Communication: Mobility status for transfers;Mobility status for ambulation General Behavior During Session: [facility] for tasks performed Cognition: Impaired  Delorse Lek 08/04/2011, 2:51 PM Toney Sang, PT [phone]

## 2011-08-05 LAB — GLUCOSE, CAPILLARY
Glucose-Capillary: 134 mg/dL — ABNORMAL HIGH (ref 70–99)
Glucose-Capillary: 330 mg/dL — ABNORMAL HIGH (ref 70–99)
Glucose-Capillary: 193 mg/dL — ABNORMAL HIGH (ref 70–99)

## 2011-08-05 LAB — BASIC METABOLIC PANEL
CO2: 30 mEq/L (ref 19–32)
Calcium: 9.4 mg/dL (ref 8.4–10.5)
GFR calc non Af Amer: 50 mL/min — ABNORMAL LOW (ref >90)
Sodium: 140 mEq/L (ref 135–145)

## 2011-08-05 LAB — CARDIAC PANEL(CRET KIN+CKTOT+MB+TROPI)
Relative Index: INVALID (ref 0.0–2.5)
Total CK: 56 U/L (ref 7–177)

## 2011-08-05 MED ORDER — BISACODYL 10 MG RE SUPP: 10.0000 mg | Freq: Every day | RECTAL | Status: DC | PRN

## 2011-08-05 MED ORDER — PREDNISONE 20 MG PO TABS
20.0000 mg | ORAL_TABLET | Freq: Every day | ORAL | Status: DC
  Administered 2011-08-06: 20 mg via ORAL
  Filled 2011-08-05 (×2): qty 1

## 2011-08-05 MED ORDER — POTASSIUM CHLORIDE CRYS ER 20 MEQ PO TBCR
EXTENDED_RELEASE_TABLET | ORAL | Status: AC
  Filled 2011-08-05: qty 2

## 2011-08-05 MED ORDER — LEVALBUTEROL HCL 0.63 MG/3ML IN NEBU
0.6300 mg | INHALATION_SOLUTION | Freq: Two times a day (BID) | RESPIRATORY_TRACT | Status: DC
  Administered 2011-08-05 – 2011-08-06 (×2): 0.63 mg via RESPIRATORY_TRACT
  Filled 2011-08-05 (×4): qty 3

## 2011-08-05 MED ORDER — LEVOFLOXACIN 250 MG PO TABS
250.0000 mg | ORAL_TABLET | Freq: Every day | ORAL | Status: DC
  Administered 2011-08-06: 250 mg via ORAL
  Filled 2011-08-05 (×2): qty 1

## 2011-08-05 MED ORDER — FUROSEMIDE 20 MG PO TABS
20.0000 mg | ORAL_TABLET | Freq: Every day | ORAL | Status: DC
  Administered 2011-08-06: 20 mg via ORAL
  Filled 2011-08-05: qty 1

## 2011-08-05 MED ORDER — POTASSIUM CHLORIDE CRYS ER 20 MEQ PO TBCR
40.0000 meq | EXTENDED_RELEASE_TABLET | ORAL | Status: AC
  Administered 2011-08-05: 40 meq via ORAL
  Filled 2011-08-05: qty 1

## 2011-08-05 MED ORDER — POLYETHYLENE GLYCOL 3350 17 G PO PACK
17.0000 g | PACK | Freq: Every day | ORAL | Status: DC | PRN
  Filled 2011-08-05: qty 1

## 2011-08-05 MED ORDER — POTASSIUM CHLORIDE CRYS ER 20 MEQ PO TBCR
40.0000 meq | EXTENDED_RELEASE_TABLET | Freq: Once | ORAL | Status: AC
  Administered 2011-08-05: 40 meq via ORAL
  Filled 2011-08-05: qty 2

## 2011-08-05 NOTE — Progress Notes (Signed)
08/05/2011 6:22 PM Nursing Note Patient ambulated around unit 150 feet with rolling walker, RN and on RA. Patient tolerated well. Encouraged further ambulation this evening.  Ulises Wolfinger, Blanchard Kelch

## 2011-08-05 NOTE — Progress Notes (Signed)
Occupational Therapy Treatment Patient Details Name: LESLYN MONDA MRN: [id number] DOB: [birth year] Today's Date: 08/05/2011  OT Assessment/Plan OT Assessment/Plan OT Plan: Discharge plan remains appropriate OT Goals ADL Goals ADL Goal: Grooming - Progress: Progressing toward goals ADL Goal: Toilet Transfer - Progress: Progressing toward goals ADL Goal: Toileting - Clothing Manipulation - Progress: Progressing toward goals ADL Goal: Toileting - Hygiene - Progress: Progressing toward goals  OT Treatment Precautions/Restrictions  Precautions Precautions: Fall Restrictions Weight Bearing Restrictions: No   ADL ADL Grooming: Performed;Wash/dry hands;Supervision/safety Grooming Details (indicate cue type and reason): increased time for pt to problem solve where soap and paper towels are Where Assessed - Grooming: Standing at sink Toilet Transfer: Performed;Minimal assistance Toilet Transfer Details (indicate cue type and reason): Min A to manuever RW in bathroom. VC for hand placement Toilet Transfer Method: Proofreader: Regular height toilet;Grab bars Toileting - Clothing Manipulation: Performed;Supervision/safety Where Assessed - Glass blower/designer Manipulation: Standing Toileting - Hygiene: Performed;Supervision/safety Where Assessed - Toileting Hygiene: Sit on 3-in-1 or toilet Tub/Shower Transfer: Performed;Minimal assistance Tub/Shower Transfer Details (indicate cue type and reason): VC for RW and hand placement. Pt steady if holding to grab bars, but had difficulty problem solving where to put RW prior to shower transfer Tub/Shower Transfer Method: Ambulating Tub/Shower Transfer Equipment: Walk in shower;Grab bars Equipment Used: Rolling walker Ambulation Related to ADLs: Pt O2 sats remained 92 and above throughout. 93 at end of session  End of Session OT - End of Session Equipment Utilized During Treatment: Gait belt Activity Tolerance: Patient  tolerated treatment well Patient left: in chair;with call bell in reach General Behavior During Session: [facility] for tasks performed Cognition: Impaired  Jacquelin Krajewski  08/05/2011, 11:18 AM

## 2011-08-05 NOTE — Progress Notes (Signed)
   CARE MANAGEMENT NOTE 08/05/2011  Patient:  Lori [name], Lori [name]   Account Number:  [id number]  Date Initiated:  08/05/2011  Documentation initiated by:  Junius Creamer  Subjective/Objective Assessment:   adm w at fib     Action/Plan:   pt from [facility], pcp dr Thayer Headings. spoke w [facility] and they have on site phy ther if needed. phy ther order would need to be in dc summary and on fl2.   Anticipated DC Date:  08/06/2011   Anticipated DC Plan:  ASSISTED LIVING / REST HOME  In-house referral  Clinical Social Worker      DC Planning Services  CM consult      Choice offered to / List presented to:          [facility] arranged  HH-2 PT      Status of service:   Medicare Important Message given?   (If response is "NO", the following Medicare IM given date fields will be blank) Date Medicare IM given:   Date Additional Medicare IM given:    Discharge Disposition:  ASSISTED LIVING  Per UR Regulation:    Comments:  3/6 alf will provide phy ther, they arrange themselves. debbie Chrsitopher Wik rn,bsn [id number]

## 2011-08-05 NOTE — Progress Notes (Signed)
08/05/2011 1500 Nursing Note Pt. C/o constipation. Dr. Edmund Hilda made aware. Orders received.  However upon offering prn medication to patient for constipation, patient refused. Will continue to monitor.  Lori [name], Blanchard Kelch

## 2011-08-05 NOTE — Progress Notes (Signed)
Inpatient Diabetes Program Recommendations  AACE/ADA: New Consensus Statement on Inpatient Glycemic Control (2009)  Target Ranges:  Prepandial:   less than 140 mg/dL      Peak postprandial:   less than 180 mg/dL (1-2 hours)      Critically ill patients:  140 - 180 mg/dL   Reason for Visit: Referral received.  Note MD requests recommendations.  Patient HgBA1C=6.8% indicating good glycemic control prior to admit.  CBG's appear to be increased due to steroids.  Steroids decreased further today.  Based on A1C, patient will likely not need insulin at home and should resume preadmission medications for diabetes.  Will follow.

## 2011-08-05 NOTE — Progress Notes (Addendum)
Subjective: Patient seen and examined , feeling better today,Complaining of stuffy nose and cough  Objective: Vital signs in last 24 hours: Temp:  [97.9 F (36.6 C)-98.4 F (36.9 C)] 98 F (36.7 C) (03/06 0547) Pulse Rate:  [95-110] 95  (03/06 0547) Resp:  [18-20] 20  (03/06 0547) BP: (138-141)/(61-78) 141/72 mmHg (03/06 0547) SpO2:  [91 %-97 %] 91 % (03/06 0547) Weight change:  Last BM Date: 07/30/11 (pt not sure )  Intake/Output from previous day: 03/05 0701 - 03/06 0700 In: 480 [P.O.:480] Out: 1650 [Urine:1650]     Physical Exam: General: Alert, awake, in no acute distress. Heart: Irregular irregular  rhythm, without murmurs, rubs, gallops. Lungs: Scattered rhonchi and coarse breathing sounds.  Abdomen: Soft, nontender, nondistended, positive bowel sounds. Extremities: No clubbing cyanosis or edema with positive pedal pulses. Neuro: Grossly intact, nonfocal.    Lab Results: Results for orders placed during the hospital encounter of 07/30/11 (from the past 24 hour(s))  GLUCOSE, CAPILLARY     Status: Abnormal   Collection Time   08/04/11 11:11 AM      Component Value Range   Glucose-Capillary 223 (*) 70 - 99 (mg/dL)   Comment 1 Documented in Chart     Comment 2 Notify RN    GLUCOSE, CAPILLARY     Status: Abnormal   Collection Time   08/04/11  4:20 PM      Component Value Range   Glucose-Capillary 130 (*) 70 - 99 (mg/dL)   Comment 1 Documented in Chart     Comment 2 Notify RN    GLUCOSE, CAPILLARY     Status: Abnormal   Collection Time   08/04/11  9:24 PM      Component Value Range   Glucose-Capillary 201 (*) 70 - 99 (mg/dL)  CARDIAC PANEL(CRET KIN+CKTOT+MB+TROPI)     Status: Normal   Collection Time   08/05/11  5:50 AM      Component Value Range   Total CK 56  7 - 177 (U/L)   CK, MB 2.6  0.3 - 4.0 (ng/mL)   Troponin I <0.30  <0.30 (ng/mL)   Relative Index RELATIVE INDEX IS INVALID  0.0 - 2.5   BASIC METABOLIC PANEL     Status: Abnormal   Collection Time   08/05/11  5:50 AM      Component Value Range   Sodium 140  135 - 145 (mEq/L)   Potassium 3.3 (*) 3.5 - 5.1 (mEq/L)   Chloride 102  96 - 112 (mEq/L)   CO2 30  19 - 32 (mEq/L)   Glucose, Bld 145 (*) 70 - 99 (mg/dL)   BUN 33 (*) 6 - 23 (mg/dL)   Creatinine, Ser 1.61  0.50 - 1.10 (mg/dL)   Calcium 9.4  8.4 - 09.6 (mg/dL)   GFR calc non Af Amer 50 (*) >90 (mL/min)   GFR calc Af Amer 58 (*) >90 (mL/min)  GLUCOSE, CAPILLARY     Status: Abnormal   Collection Time   08/05/11  6:28 AM      Component Value Range   Glucose-Capillary 134 (*) 70 - 99 (mg/dL)    Studies/Results: No results found.  Medications:    . budesonide  0.25 mg Nebulization BID  . cholecalciferol  1,000 Units Oral QODAY  . diltiazem  60 mg Oral Q8H  . furosemide  20 mg Intravenous Daily  . guaiFENesin  600 mg Oral BID  . insulin aspart  0-15 Units Subcutaneous TID WC  . insulin aspart  0-5 Units Subcutaneous QHS  . insulin glargine  10 Units Subcutaneous QHS  . levalbuterol  0.63 mg Nebulization TID  . levofloxacin  500 mg Oral Daily  . mulitivitamin with minerals  1 tablet Oral Daily  . pantoprazole  40 mg Oral Q0600  . predniSONE  40 mg Oral Q breakfast  . sodium chloride  3 mL Intravenous Q12H  . DISCONTD: diltiazem (CARDIZEM) infusion  5-15 mg/hr Intravenous Once    sodium chloride, acetaminophen, acetaminophen, guaiFENesin-dextromethorphan, levalbuterol, phenol, sodium chloride, traMADol, zolpidem     Assessment/Plan:  .Atrial fibrillation with RVR: Known history of A. fib, triggered by COPD exacerbation: HR now controlled  -  continue  PO cardizem  - 2-D echo showed EF 65-70% with PHTN, mod TR likely causing RHF  - She is not a Coumadin candidate from the previous records secondary to history of gastric AVMs and GI bleed in the past.  - D-dimer elevated, VQ scan showed very low probability for PE   .Hypotension: Likely secondary to A. fib with RVR and then Cardizem drip  -  BP stable  .COPD  exacerbation: Improving  -continue prednisone(taper dose), scheduled Xopenex nebulizers, patient is allergic to Spiriva  - Cont Levaquin ,Pulmicort, mucinex and PPI Enterococcus UTI:  continue  levaquin per pharmacy rec's Day #2  .CHF, acute: Likely precipitated scheduled to rapid A. Fib- Appreciate cardiology recommendations -  change IV Lasix to by mouth , replete  K. Hypothyroidism - TSH 0.435, supressed, T4/T3 in normal limits, given her  Afib, synthroid was stopped. TSH can be re checked in 4-6 weeks to assess if she needs synthroid.  .Diabetes mellitus: uncontrolled now sec to steroids, HbA1C 6.8  -  continue Lantus to 10 units, change to resistant sliding scale insulin, Consult diabetic coordinator for adjustment of home regimen.  .Peripheral neuropathy  - Continue tramadol when necessary  Anemia: Appears to have iron deficiency anemia, H&H stable  DVT prophylaxis: Bilateral SCDs  CODE STATUS: DO NOT RESUSCITATE  Point of contact: Rylen Hou, phone # [phone]  Disposition: Hopefully DC  To [facility] with HHPT in 24-48 hours.      LOS: 6 days   Lizzeth Meder 08/05/2011, 7:31 AM

## 2011-08-06 LAB — BASIC METABOLIC PANEL
Calcium: 9.4 mg/dL (ref 8.4–10.5)
Chloride: 100 mEq/L (ref 96–112)
Creatinine, Ser: 1.17 mg/dL — ABNORMAL HIGH (ref 0.50–1.10)
GFR calc Af Amer: 46 mL/min — ABNORMAL LOW (ref >90)

## 2011-08-06 LAB — CARDIAC PANEL(CRET KIN+CKTOT+MB+TROPI)
Relative Index: INVALID (ref 0.0–2.5)
Troponin I: <0.3 ng/mL (ref <0.30)

## 2011-08-06 LAB — MAGNESIUM: Magnesium: 1.9 mg/dL (ref 1.5–2.5)

## 2011-08-06 LAB — CBC
HCT: 40.8 % (ref 36.0–46.0)
Platelets: 296 10*3/uL (ref 150–400)
RDW: 13.1 % (ref 11.5–15.5)
WBC: 17.3 10*3/uL — ABNORMAL HIGH (ref 4.0–10.5)

## 2011-08-06 MED ORDER — PREDNISONE 20 MG PO TABS: ORAL_TABLET | ORAL | Status: DC

## 2011-08-06 MED ORDER — LEVOFLOXACIN 250 MG PO TABS: 250.0000 mg | ORAL_TABLET | Freq: Every day | ORAL | Status: AC

## 2011-08-06 MED ORDER — POLYETHYLENE GLYCOL 3350 17 G PO PACK: 17.0000 g | PACK | Freq: Every day | ORAL | Status: AC | PRN

## 2011-08-06 MED ORDER — DILTIAZEM HCL 60 MG PO TABS: 60.0000 mg | ORAL_TABLET | Freq: Three times a day (TID) | ORAL | Status: DC

## 2011-08-06 MED ORDER — BUDESONIDE 90 MCG/ACT IN AEPB: 1.0000 | INHALATION_SPRAY | Freq: Two times a day (BID) | RESPIRATORY_TRACT | Status: DC

## 2011-08-06 MED ORDER — LEVALBUTEROL TARTRATE 45 MCG/ACT IN AERO: 1.0000 | INHALATION_SPRAY | RESPIRATORY_TRACT | Status: AC | PRN

## 2011-08-06 MED ORDER — SENNA-DOCUSATE SODIUM 8.6-50 MG PO TABS: 2.0000 | ORAL_TABLET | Freq: Every day | ORAL | Status: AC

## 2011-08-06 MED ORDER — GLYBURIDE 5 MG PO TABS: 10.0000 mg | ORAL_TABLET | Freq: Every day | ORAL | Status: DC

## 2011-08-06 MED ORDER — FUROSEMIDE 20 MG PO TABS: 20.0000 mg | ORAL_TABLET | Freq: Every day | ORAL | Status: DC

## 2011-08-06 MED ORDER — POTASSIUM CHLORIDE ER 10 MEQ PO TBCR: 10.0000 meq | EXTENDED_RELEASE_TABLET | Freq: Every day | ORAL | Status: DC

## 2011-08-06 MED ORDER — GUAIFENESIN ER 600 MG PO TB12: 600.0000 mg | ORAL_TABLET | Freq: Two times a day (BID) | ORAL | Status: AC

## 2011-08-06 MED ORDER — PANTOPRAZOLE SODIUM 40 MG PO TBEC: 40.0000 mg | DELAYED_RELEASE_TABLET | Freq: Every day | ORAL | Status: DC

## 2011-08-06 NOTE — Progress Notes (Signed)
Clinical social worker submitted clinicals to pt assisted living facility. Pt alf, accepted clinicals and approved pt to return to assisted living facility. Pt anticipated to discharge today. CSW left message for pt son Dorene Sorrow regarding discharge.   .Clinical social worker continuing to follow pt to assist with pt dc plans and further csw needs.   Catha Gosselin, Theresia Majors  [phone] .08/06/2011 11:00am

## 2011-08-06 NOTE — Discharge Instructions (Signed)
[facility] will arrange onsite phy therapy [facility] will have [facility] lab draw labs-cbc and bemt on mon 3-11

## 2011-08-06 NOTE — Progress Notes (Signed)
Clinical social worker confirmed with pt sons Jilda Panda and Bethann Berkshire that pt will be retuning to [facility] today. Pt son Adriana Mccallum will be providing transportation for pt to pt alf. .Clinical social worker continuing to follow pt to assist with pt dc plans and further csw needs. Pt son is anticipated to be at pt room at 12:00 to assist with pt transportation. CSW informed RN.    CSW will be submitting clinicals to alf and will provide pt son with pt discharge packet for facility.   .No further Clinical Social Work needs, signing off.   Catha Gosselin, Theresia Majors  [phone] .08/06/2011 11:10am

## 2011-08-06 NOTE — Discharge Summary (Addendum)
Patient ID: Lori [name] MRN: [id number] DOB/AGE: 1920-08-28 78 y.o.  Admit date: 07/30/2011 Discharge date: 08/06/2011  Primary Care Physician:  Thayer Headings, MD, MD   Discharge Diagnoses:    Present on Admission:  .Atrial fibrillation with RVR .Hypotension .COPD exacerbation .CHF, acute .Diabetes mellitus Constipation  AKI Hypokalemia  .Hyperlipidemia .Peripheral neuropathy  Medication List  As of 08/06/2011  9:32 AM   STOP taking these medications         bisoprolol 5 MG tablet      levothyroxine 25 MCG tablet      olmesartan 40 MG tablet         TAKE these medications         Budesonide 90 MCG/ACT inhaler   Inhale 1 puff into the lungs 2 (two) times daily.      diltiazem 60 MG tablet   Commonly known as: CARDIZEM   Take 1 tablet (60 mg total) by mouth every 8 (eight) hours.      furosemide 20 MG tablet   Commonly known as: LASIX   Take 1 tablet (20 mg total) by mouth daily.      glyBURIDE 5 MG tablet   Commonly known as: DIABETA   Take 2 tablets (10 mg total) by mouth daily with breakfast.      guaiFENesin 600 MG 12 hr tablet   Commonly known as: MUCINEX   Take 1 tablet (600 mg total) by mouth 2 (two) times daily.      levalbuterol 45 MCG/ACT inhaler   Commonly known as: XOPENEX HFA   Inhale 1-2 puffs into the lungs every 4 (four) hours as needed for wheezing.      levofloxacin 250 MG tablet   Commonly known as: LEVAQUIN   Take 1 tablet (250 mg total) by mouth daily.      mulitivitamin with minerals Tabs   Take 1 tablet by mouth daily.      pantoprazole 40 MG tablet   Commonly known as: PROTONIX   Take 1 tablet (40 mg total) by mouth daily at 6 (six) AM.      polyethylene glycol packet   Commonly known as: MIRALAX / GLYCOLAX   Take 17 g by mouth daily as needed.      predniSONE 20 MG tablet   Commonly known as: DELTASONE   Take 20 mg po daily for 2 days then 10 mg po daily for 3 days      sennosides-docusate sodium 8.6-50 MG tablet   Commonly known as: SENOKOT-S   Take 2 tablets by mouth daily.      traMADol 50 MG tablet   Commonly known as: ULTRAM   Take 50 mg by mouth every 8 (eight) hours as needed. pain      Vitamin D-3 5000 UNITS Tabs   Take 1 tablet by mouth every other day.      zolpidem 10 MG tablet   Commonly known as: AMBIEN   Take 5-10 mg by mouth at bedtime as needed. sleep          KCL 10 meq tablet               Take 10 meq po daily      Consults:Dr Zoila Shutter / Cardiology    Significant Diagnostic Studies:  Nm Pulmonary Perfusion  07/30/2011  *RADIOLOGY REPORT*  Clinical Data: Shortness of breath.  Hypertension.  Diabetes.  NM PULMONARY PERFUSION PARTICULATE  Radiopharmaceutical:  3 mCi technetium 99 MAA  Comparison: Chest radiograph of  07/30/2011  Findings: Very subtly reduced perfusion at the right lung bases significantly less than the radiographic abnormality. Due to limited mobility the patient was imaged with arms by her sides, which is also contributory. No additional perfusion defect observed. Given these findings, ventilation imaging was not performed.  IMPRESSION:  1.  Very low probability (0 - 9% risk) of pulmonary embolus.  Original Report Authenticated By: Dellia Cloud, M.D.   Dg Chest Port 1 View  07/30/2011  *RADIOLOGY REPORT*  Clinical Data: Shortness of breath.  PORTABLE CHEST - 1 VIEW  Comparison: 08/21/2009 and 04/20/2007  Findings: The patient has chronic obstructive lung disease with chronic scarring at the right lung base.  Heart size and vascularity are normal.  No acute abnormalities.  No free air seen under the diaphragm.  No acute osseous abnormalities.  IMPRESSION: Chronic obstructive lung disease.  Scarring at the right lung base. No acute abnormalities.  Original Report Authenticated By: Gwynn Burly, M.D.    Brief H and P: For complete details please refer to admission H and P, but in brief  Patient is 78 year old female with past medical history of  diabetes mellitus, hypertension, atrial fibrillation, hyperlipidemia, history of gastric AVMs and GI bleed in the past, not considered a Coumadin candidate secondary to GI bleed/ gastric AVMs was sent from NH for shortness of breath and wheezing. History was also obtained from the patient who stated that she's been having shortness of breath over the last 2-3 weeks however worsened over the last 1 week with wheezing. She denied any chest pain, diaphoresis or any productive cough, fevers or chills. In the ER patient was also noted to be in rapid A. fib with heart rate of 145 and wheezing in all lung fields. Patient was started on Cardizem drip and hospitalist service was called for admission.  At the time of my examination, Patient was somewhat hypotensive with SBP in 70's-80's, heart rate still elevated 105-115 on Cardizem drip      Hospital Course:  Briefly patient is 78 year old female with history of diabetes mellitus, HTN, A. fib, hyperlipidemia, history of gastric AVMs and GI bleed in the past, not considered a Coumadin candidate due to GI bleed/gastric AVMs was sent from nursing home for shortness of breath and wheezing. In the ED, patient was noted to be in rapid A. fib with heart rate of 145 and wheezing in all lung fields. Patient was started on Cardizem drip, she became somewhat hypotensive with SBP in 70s-80's. Cardiology was consulted and patient was admitted to step down unit. VQ scan was negative for any acute PE. Patient was placed on scheduled nebulizer treatments, antibiotics, O2 supplementation, steroids. It was a difficult situation to place her on Cardizem and IV diuresis and uptitrate secondary to borderline hypotension. Cardizem was increased by cardiology and transitioned to PO, so far she is tolerating the higher dose. Patient was transferred to monitored floor on 08/03/2011. She was also placed on low dose of IV Lasix at 20 mg and appears to be tolerating this well. PT eval was done  and recommended home health PT at ALF.  Marland KitchenAtrial fibrillation with RVR: Known history of A. fib, triggered by COPD exacerbation: HR now controlled  - continue PO cardizem  - 2-D echo showed EF 65-70% with PHTN, mod TR likely causing RHF  - She is not a Coumadin candidate from the previous records secondary to history of gastric AVMs and GI bleed in the past.  - D-dimer  Was elevated,  VQ scan showed very low probability for PE   .Hypotension: Resolved ,was Likely secondary to A. fib with RVR and then Cardizem drip  - BP currently stable , continue Cardizem,BB was discontinued and losartan held because of AKI  .COPD exacerbation: Improved -continue prednisone(taper dose), will  Prescribe Xopenex inhaler and pulmicort ,patient to follow with PCP - Cont Levaquin ,mucinex  Enterococcus UTI: continue levaquin to complete 7 days  .CHF, acute: Likely precipitated scheduled to rapid A. Fib- A.continue po lasix   Hypothyroidism  - TSH 0.435,  T4/T3 in normal limits, given her Afib, synthroid was stopped. TSH can be re checked in 4-6 weeks to assess if she needs synthroid.  .Diabetes mellitus: uncontrolled now sec to steroids, HbA1C 6.8  - glyburide was held on admission and she was treated with  Lantus to 10 units, HBA1C6.8% ,will resume Glyburide in discharge ,however I will decrease dose to 10 mg daily given worsening renal function and risk for hypoglycemia,monitor CBG closely and adjust  AKI: Most likely secondary to diuresis, IV Lasix was switched to by mouth, losartan was held. Follow with PCP ASAP for repeat labs.  .Peripheral neuropathy  - Continue tramadol when necessary  Anemia:H&H stable  Leukocytosis : probably secondary to steroids and UTI.improving  Constipation :continue laxatives Hypokalemia: Repleted, will prescribe KCL 10 meq daily to take along with lasix   Subjective  Seen and examined, feeling better and denies any complaints.  Filed Vitals:   08/06/11 0819  BP: 109/59    Pulse: 86  Temp: 97.6 F (36.4 C)  Resp: 22    General: Alert, awake,in no acute distress. Heart: Irregular irregular rhythm, without murmurs, rubs, gallops. Lungs: Clear to auscultation bilaterally. Abdomen: Soft, nontender, nondistended, positive bowel sounds. Extremities: No clubbing cyanosis or edema with positive pedal pulses. Neuro: Grossly intact, nonfocal.   Disposition and Follow-up:  To [facility] with HHPT/HHRN Follow with PCP  In one week ,cardiology as scheduled  Repeat BMP and CBC on Monday 08/10/11 and call results to PCP    Time spent on Discharge: Approximately 50 minutes   Signed: Brenner Visconti 08/06/2011, 9:32 AM

## 2011-08-06 NOTE — Progress Notes (Signed)
Physical Therapy Treatment Patient Details Name: Lori [name] MRN: [id number] DOB: 12/07/1920 Today's Date: 08/06/2011  PT Assessment/Plan  PT - Assessment/Plan Comments on Treatment Session: Pt moving well and ready for discharge. Pt stood at sink and independently brushed teeth before ambulation. After walking pt needed to have BM and left with RN in room addressing constipation, HEP deferred.  PT Plan: Discharge plan remains appropriate Follow Up Recommendations: Home health PT PT Goals  Acute Rehab PT Goals PT Goal: Sit to Stand - Progress: Progressing toward goal PT Goal: Stand to Sit - Progress: Progressing toward goal PT Goal: Ambulate - Progress: Met  PT Treatment Precautions/Restrictions  Precautions Precautions: Fall Precaution Comments: O2 Required Braces or Orthoses: No Restrictions Weight Bearing Restrictions: No Mobility (including Balance) Bed Mobility Bed Mobility: No Transfers Sit to Stand: 5: Supervision;From toilet;From chair/3-in-1 Sit to Stand Details (indicate cue type and reason): x3 trials cueing for sequence and hand placement Stand to Sit: To chair/3-in-1;To toilet Stand to Sit Details: cueing to control descent to chair, smooth transition to commode Ambulation/Gait Ambulation/Gait: Yes Ambulation/Gait Assistance: 6: Modified independent (Device/Increase time) Ambulation/Gait Assistance Details (indicate cue type and reason): VC to room only Ambulation Distance (Feet): 400 Feet Assistive device: Rolling walker Gait Pattern: Within Functional Limits Gait velocity: decreased 73ft/23sec= .869 placing pt at increased fall risk Stairs: No  Posture/Postural Control Posture/Postural Control: Postural limitations Postural Limitations: flexed trunk Exercise    End of Session PT - End of Session Activity Tolerance: Patient tolerated treatment well Patient left: with call bell in reach;Other (comment) (in bathroom with call bell and RN) Nurse  Communication: Mobility status for transfers;Mobility status for ambulation General Behavior During Session: [facility] for tasks performed Cognition: Impaired  Delorse Lek 08/06/2011, 11:36 AM Toney Sang, PT [phone]

## 2011-08-06 NOTE — Progress Notes (Signed)
   CARE MANAGEMENT NOTE 08/06/2011  Patient:  Lori [name], Lori [name]   Account Number:  [id number]  Date Initiated:  08/05/2011  Documentation initiated by:  Junius Creamer  Subjective/Objective Assessment:   adm w at fib     Action/Plan:   pt from [facility], pcp dr Thayer Headings. spoke w [facility] and they have on site phy ther if needed. phy ther order would need to be in dc summary and on fl2.   Anticipated DC Date:  08/06/2011   Anticipated DC Plan:  ASSISTED LIVING / REST HOME  In-house referral  Clinical Social Worker      DC Planning Services  CM consult      Choice offered to / List presented to:          [facility] arranged  HH-2 PT  HH-1 RN      Status of service:   Medicare Important Message given?   (If response is "NO", the following Medicare IM given date fields will be blank) Date Medicare IM given:   Date Additional Medicare IM given:    Discharge Disposition:  ASSISTED LIVING  Per UR Regulation:    Comments:  3/7 spoke w [facility] and they have on site phy ther and [facility] labs draw lbs, they need md to write orders and [facility] arranges themselves. debbie Rossetta Kama rn,bsn  3/6 alf will provide phy ther, they arrange themselves. debbie Vaniyah Lansky rn,bsn [id number]

## 2012-08-17 ENCOUNTER — Inpatient Hospital Stay (HOSPITAL_COMMUNITY)
Admission: EM | Admit: 2012-08-17 | Discharge: 2012-08-22 | DRG: 470 | Disposition: A | Discharge status: Skilled Nursing Facility | Payer: Medicare Other | Attending: Internal Medicine | Admitting: Internal Medicine

## 2012-08-17 ENCOUNTER — Emergency Department (HOSPITAL_COMMUNITY): Payer: Medicare Other

## 2012-08-17 ENCOUNTER — Encounter (HOSPITAL_COMMUNITY): Payer: Self-pay | Admitting: Emergency Medicine

## 2012-08-17 DIAGNOSIS — J441 Chronic obstructive pulmonary disease with (acute) exacerbation: Secondary | ICD-10-CM

## 2012-08-17 DIAGNOSIS — G629 Polyneuropathy, unspecified: Secondary | ICD-10-CM

## 2012-08-17 DIAGNOSIS — Y9210 Unspecified residential institution as the place of occurrence of the external cause: Secondary | ICD-10-CM | POA: Diagnosis present

## 2012-08-17 DIAGNOSIS — W010XXA Fall on same level from slipping, tripping and stumbling without subsequent striking against object, initial encounter: Secondary | ICD-10-CM | POA: Diagnosis present

## 2012-08-17 DIAGNOSIS — E119 Type 2 diabetes mellitus without complications: Secondary | ICD-10-CM

## 2012-08-17 DIAGNOSIS — I10 Essential (primary) hypertension: Secondary | ICD-10-CM | POA: Diagnosis present

## 2012-08-17 DIAGNOSIS — E1169 Type 2 diabetes mellitus with other specified complication: Secondary | ICD-10-CM | POA: Diagnosis not present

## 2012-08-17 DIAGNOSIS — I4891 Unspecified atrial fibrillation: Secondary | ICD-10-CM

## 2012-08-17 DIAGNOSIS — S72033A Displaced midcervical fracture of unspecified femur, initial encounter for closed fracture: Principal | ICD-10-CM | POA: Diagnosis present

## 2012-08-17 DIAGNOSIS — D62 Acute posthemorrhagic anemia: Secondary | ICD-10-CM | POA: Diagnosis not present

## 2012-08-17 DIAGNOSIS — J449 Chronic obstructive pulmonary disease, unspecified: Secondary | ICD-10-CM | POA: Diagnosis present

## 2012-08-17 DIAGNOSIS — S72002A Fracture of unspecified part of neck of left femur, initial encounter for closed fracture: Secondary | ICD-10-CM

## 2012-08-17 DIAGNOSIS — N179 Acute kidney failure, unspecified: Secondary | ICD-10-CM | POA: Diagnosis not present

## 2012-08-17 DIAGNOSIS — I509 Heart failure, unspecified: Secondary | ICD-10-CM | POA: Diagnosis present

## 2012-08-17 DIAGNOSIS — E785 Hyperlipidemia, unspecified: Secondary | ICD-10-CM | POA: Diagnosis present

## 2012-08-17 DIAGNOSIS — D500 Iron deficiency anemia secondary to blood loss (chronic): Secondary | ICD-10-CM | POA: Diagnosis present

## 2012-08-17 DIAGNOSIS — K219 Gastro-esophageal reflux disease without esophagitis: Secondary | ICD-10-CM | POA: Diagnosis present

## 2012-08-17 DIAGNOSIS — J4489 Other specified chronic obstructive pulmonary disease: Secondary | ICD-10-CM | POA: Diagnosis present

## 2012-08-17 LAB — COMPREHENSIVE METABOLIC PANEL
ALT: 17 U/L (ref 0–35)
Alkaline Phosphatase: 92 U/L (ref 39–117)
BUN: 30 mg/dL — ABNORMAL HIGH (ref 6–23)
CO2: 28 mEq/L (ref 19–32)
Chloride: 101 mEq/L (ref 96–112)
GFR calc Af Amer: 34 mL/min — ABNORMAL LOW (ref >90)
GFR calc non Af Amer: 29 mL/min — ABNORMAL LOW (ref >90)
Glucose, Bld: 159 mg/dL — ABNORMAL HIGH (ref 70–99)
Potassium: 4.3 mEq/L (ref 3.5–5.1)
Total Bilirubin: 0.2 mg/dL — ABNORMAL LOW (ref 0.3–1.2)
Total Protein: 8 g/dL (ref 6.0–8.3)

## 2012-08-17 LAB — URINALYSIS, MICROSCOPIC ONLY
Bilirubin Urine: NEGATIVE
Ketones, ur: NEGATIVE mg/dL
Leukocytes, UA: NEGATIVE
Nitrite: NEGATIVE
Specific Gravity, Urine: 1.023 (ref 1.005–1.030)
Urobilinogen, UA: 0.2 mg/dL (ref 0.0–1.0)
pH: 6 (ref 5.0–8.0)

## 2012-08-17 LAB — CBC WITH DIFFERENTIAL/PLATELET
Eosinophils Absolute: 0.1 10*3/uL (ref 0.0–0.7)
Hemoglobin: 11.7 g/dL — ABNORMAL LOW (ref 12.0–15.0)
Lymphocytes Relative: 16 % (ref 12–46)
Lymphs Abs: 1.9 10*3/uL (ref 0.7–4.0)
MCH: 28.7 pg (ref 26.0–34.0)
MCV: 89.2 fL (ref 78.0–100.0)
Monocytes Relative: 6 % (ref 3–12)
Neutrophils Relative %: 77 % (ref 43–77)
RBC: 4.08 MIL/uL (ref 3.87–5.11)
WBC: 11.7 10*3/uL — ABNORMAL HIGH (ref 4.0–10.5)

## 2012-08-17 LAB — PROTIME-INR: Prothrombin Time: 12.5 seconds (ref 11.6–15.2)

## 2012-08-17 MED ORDER — SODIUM CHLORIDE 0.9 % IV SOLN
INTRAVENOUS | Status: DC
  Administered 2012-08-17: 23:00:00 via INTRAVENOUS

## 2012-08-17 MED ORDER — FENTANYL CITRATE 0.05 MG/ML IJ SOLN
50.0000 ug | INTRAMUSCULAR | Status: AC | PRN
  Administered 2012-08-17 – 2012-08-18 (×2): 50 ug via INTRAVENOUS
  Filled 2012-08-17 (×2): qty 2

## 2012-08-17 NOTE — ED Notes (Signed)
EKG given to EDP, Steinl,MD.

## 2012-08-17 NOTE — ED Notes (Signed)
[id number]<AV> Expected date:<BR> Expected time:<BR> Means of arrival:<BR> Comments:<BR> EMS/79 yo female fall/left hip pain

## 2012-08-17 NOTE — ED Notes (Signed)
XR at bedside

## 2012-08-17 NOTE — ED Notes (Signed)
Brought in by EMS from [facility] facility after her mechanical fall tonight.  Per EMS, pt was ambulating when she slipped and fell backwards, pt c/o immediate left hip pain after the fall; pt denies hitting head.

## 2012-08-18 ENCOUNTER — Inpatient Hospital Stay (HOSPITAL_COMMUNITY): Payer: Medicare Other

## 2012-08-18 ENCOUNTER — Encounter (HOSPITAL_COMMUNITY)
Admission: EM | Disposition: A | Discharge status: Skilled Nursing Facility | Payer: Self-pay | Source: Home / Self Care | Attending: Internal Medicine

## 2012-08-18 ENCOUNTER — Inpatient Hospital Stay (HOSPITAL_COMMUNITY): Payer: Medicare Other | Admitting: Registered Nurse

## 2012-08-18 ENCOUNTER — Encounter (HOSPITAL_COMMUNITY): Payer: Self-pay | Admitting: Internal Medicine

## 2012-08-18 ENCOUNTER — Encounter (HOSPITAL_COMMUNITY): Payer: Self-pay | Admitting: Registered Nurse

## 2012-08-18 DIAGNOSIS — I4891 Unspecified atrial fibrillation: Secondary | ICD-10-CM | POA: Diagnosis present

## 2012-08-18 DIAGNOSIS — S72009A Fracture of unspecified part of neck of unspecified femur, initial encounter for closed fracture: Secondary | ICD-10-CM

## 2012-08-18 DIAGNOSIS — S72002A Fracture of unspecified part of neck of left femur, initial encounter for closed fracture: Secondary | ICD-10-CM

## 2012-08-18 DIAGNOSIS — J449 Chronic obstructive pulmonary disease, unspecified: Secondary | ICD-10-CM

## 2012-08-18 DIAGNOSIS — E119 Type 2 diabetes mellitus without complications: Secondary | ICD-10-CM

## 2012-08-18 DIAGNOSIS — I10 Essential (primary) hypertension: Secondary | ICD-10-CM | POA: Diagnosis present

## 2012-08-18 HISTORY — PX: HIP ARTHROPLASTY: SHX981

## 2012-08-18 LAB — BASIC METABOLIC PANEL
CO2: 25 mEq/L (ref 19–32)
Calcium: 9.1 mg/dL (ref 8.4–10.5)
Creatinine, Ser: 1.13 mg/dL — ABNORMAL HIGH (ref 0.50–1.10)
GFR calc non Af Amer: 41 mL/min — ABNORMAL LOW (ref >90)
Sodium: 136 mEq/L (ref 135–145)

## 2012-08-18 LAB — GLUCOSE, CAPILLARY
Glucose-Capillary: 145 mg/dL — ABNORMAL HIGH (ref 70–99)
Glucose-Capillary: 158 mg/dL — ABNORMAL HIGH (ref 70–99)

## 2012-08-18 LAB — CBC
MCH: 28.2 pg (ref 26.0–34.0)
MCV: 89 fL (ref 78.0–100.0)
Platelets: 234 10*3/uL (ref 150–400)
RDW: 13 % (ref 11.5–15.5)
WBC: 12.8 10*3/uL — ABNORMAL HIGH (ref 4.0–10.5)

## 2012-08-18 LAB — SURGICAL PCR SCREEN: MRSA, PCR: NEGATIVE

## 2012-08-18 LAB — ABO/RH: ABO/RH(D): AB POS

## 2012-08-18 LAB — ALBUMIN: Albumin: 3 g/dL — ABNORMAL LOW (ref 3.5–5.2)

## 2012-08-18 SURGERY — HEMIARTHROPLASTY, HIP, DIRECT ANTERIOR APPROACH, FOR FRACTURE
Anesthesia: General | Site: Hip | Laterality: Left | Wound class: Clean

## 2012-08-18 MED ORDER — MENTHOL 3 MG MT LOZG: 1.0000 | LOZENGE | OROMUCOSAL | Status: DC | PRN

## 2012-08-18 MED ORDER — NEOSTIGMINE METHYLSULFATE 1 MG/ML IJ SOLN
INTRAMUSCULAR | Status: DC | PRN
  Administered 2012-08-18: 3 mg via INTRAVENOUS

## 2012-08-18 MED ORDER — BUPIVACAINE HCL (PF) 0.5 % IJ SOLN
INTRAMUSCULAR | Status: DC | PRN
  Administered 2012-08-18: 10 mL

## 2012-08-18 MED ORDER — DILTIAZEM HCL 60 MG PO TABS
60.0000 mg | ORAL_TABLET | Freq: Three times a day (TID) | ORAL | Status: DC
  Administered 2012-08-18 – 2012-08-22 (×9): 60 mg via ORAL
  Filled 2012-08-18 (×17): qty 1

## 2012-08-18 MED ORDER — METOCLOPRAMIDE HCL 5 MG/ML IJ SOLN: 5.0000 mg | Freq: Three times a day (TID) | INTRAMUSCULAR | Status: DC | PRN

## 2012-08-18 MED ORDER — PHENYLEPHRINE HCL 10 MG/ML IJ SOLN
INTRAMUSCULAR | Status: DC | PRN
  Administered 2012-08-18: 80 ug via INTRAVENOUS

## 2012-08-18 MED ORDER — HYDROCODONE-ACETAMINOPHEN 5-325 MG PO TABS: 1.0000 | ORAL_TABLET | Freq: Four times a day (QID) | ORAL | Status: DC | PRN

## 2012-08-18 MED ORDER — CEFAZOLIN SODIUM-DEXTROSE 2-3 GM-% IV SOLR
2.0000 g | Freq: Once | INTRAVENOUS | Status: DC
  Filled 2012-08-18: qty 50

## 2012-08-18 MED ORDER — CEFAZOLIN SODIUM-DEXTROSE 2-3 GM-% IV SOLR
INTRAVENOUS | Status: DC | PRN
  Administered 2012-08-18: 2 g via INTRAVENOUS

## 2012-08-18 MED ORDER — ACETAMINOPHEN 325 MG PO TABS
650.0000 mg | ORAL_TABLET | Freq: Four times a day (QID) | ORAL | Status: DC | PRN
  Administered 2012-08-22: 650 mg via ORAL
  Filled 2012-08-18: qty 2

## 2012-08-18 MED ORDER — ACETAMINOPHEN 650 MG RE SUPP: 650.0000 mg | Freq: Four times a day (QID) | RECTAL | Status: DC | PRN

## 2012-08-18 MED ORDER — FENTANYL CITRATE 0.05 MG/ML IJ SOLN: 25.0000 ug | INTRAMUSCULAR | Status: DC | PRN

## 2012-08-18 MED ORDER — FLUTICASONE PROPIONATE HFA 44 MCG/ACT IN AERO
2.0000 | INHALATION_SPRAY | Freq: Two times a day (BID) | RESPIRATORY_TRACT | Status: DC
  Administered 2012-08-18 – 2012-08-22 (×8): 2 via RESPIRATORY_TRACT
  Filled 2012-08-18: qty 10.6

## 2012-08-18 MED ORDER — PANTOPRAZOLE SODIUM 40 MG PO TBEC
40.0000 mg | DELAYED_RELEASE_TABLET | Freq: Every morning | ORAL | Status: DC
  Administered 2012-08-18 – 2012-08-22 (×5): 40 mg via ORAL
  Filled 2012-08-18 (×5): qty 1

## 2012-08-18 MED ORDER — ZOLPIDEM TARTRATE 5 MG PO TABS: 5.0000 mg | ORAL_TABLET | Freq: Every evening | ORAL | Status: DC | PRN

## 2012-08-18 MED ORDER — PHENOL 1.4 % MT LIQD: 1.0000 | OROMUCOSAL | Status: DC | PRN

## 2012-08-18 MED ORDER — ONDANSETRON HCL 4 MG PO TABS: 4.0000 mg | ORAL_TABLET | Freq: Four times a day (QID) | ORAL | Status: DC | PRN

## 2012-08-18 MED ORDER — PROPOFOL 10 MG/ML IV BOLUS
INTRAVENOUS | Status: DC | PRN
  Administered 2012-08-18: 100 mg via INTRAVENOUS

## 2012-08-18 MED ORDER — 0.9 % SODIUM CHLORIDE (POUR BTL) OPTIME
TOPICAL | Status: DC | PRN
  Administered 2012-08-18: 1000 mL

## 2012-08-18 MED ORDER — WARFARIN SODIUM 5 MG PO TABS: 5.0000 mg | ORAL_TABLET | Freq: Every day | ORAL | Status: DC

## 2012-08-18 MED ORDER — HYDROCODONE-ACETAMINOPHEN 5-325 MG PO TABS
1.0000 | ORAL_TABLET | Freq: Four times a day (QID) | ORAL | Status: DC | PRN
  Administered 2012-08-18: 2 via ORAL
  Administered 2012-08-18 – 2012-08-19 (×2): 1 via ORAL
  Administered 2012-08-19: 2 via ORAL
  Administered 2012-08-20: 1 via ORAL
  Administered 2012-08-20: 2 via ORAL
  Administered 2012-08-21 – 2012-08-22 (×3): 1 via ORAL
  Filled 2012-08-18 (×2): qty 1
  Filled 2012-08-18 (×2): qty 2
  Filled 2012-08-18 (×2): qty 1
  Filled 2012-08-18: qty 2
  Filled 2012-08-18 (×2): qty 1

## 2012-08-18 MED ORDER — INSULIN ASPART 100 UNIT/ML ~~LOC~~ SOLN
0.0000 [IU] | Freq: Three times a day (TID) | SUBCUTANEOUS | Status: DC
  Administered 2012-08-18: 1 [IU] via SUBCUTANEOUS
  Administered 2012-08-19: 3 [IU] via SUBCUTANEOUS
  Administered 2012-08-19: 1 [IU] via SUBCUTANEOUS
  Administered 2012-08-19 – 2012-08-21 (×5): 2 [IU] via SUBCUTANEOUS
  Administered 2012-08-22: 1 [IU] via SUBCUTANEOUS

## 2012-08-18 MED ORDER — DOCUSATE SODIUM 100 MG PO CAPS
100.0000 mg | ORAL_CAPSULE | Freq: Two times a day (BID) | ORAL | Status: DC
  Administered 2012-08-18 – 2012-08-22 (×7): 100 mg via ORAL
  Filled 2012-08-18 (×9): qty 1

## 2012-08-18 MED ORDER — FENTANYL CITRATE 0.05 MG/ML IJ SOLN
INTRAMUSCULAR | Status: DC | PRN
  Administered 2012-08-18 (×2): 50 ug via INTRAVENOUS
  Administered 2012-08-18 (×3): 25 ug via INTRAVENOUS

## 2012-08-18 MED ORDER — ONDANSETRON HCL 4 MG/2ML IJ SOLN
INTRAMUSCULAR | Status: DC | PRN
  Administered 2012-08-18: 4 mg via INTRAVENOUS

## 2012-08-18 MED ORDER — MORPHINE SULFATE 2 MG/ML IJ SOLN: 0.5000 mg | INTRAMUSCULAR | Status: DC | PRN

## 2012-08-18 MED ORDER — DEXTROSE 50 % IV SOLN
INTRAVENOUS | Status: AC
  Administered 2012-08-18: 25 g
  Filled 2012-08-18: qty 50

## 2012-08-18 MED ORDER — METOCLOPRAMIDE HCL 10 MG PO TABS: 5.0000 mg | ORAL_TABLET | Freq: Three times a day (TID) | ORAL | Status: DC | PRN

## 2012-08-18 MED ORDER — SODIUM CHLORIDE 0.9 % IV SOLN
INTRAVENOUS | Status: DC
  Administered 2012-08-18: 08:00:00 via INTRAVENOUS

## 2012-08-18 MED ORDER — ROCURONIUM BROMIDE 100 MG/10ML IV SOLN
INTRAVENOUS | Status: DC | PRN
  Administered 2012-08-18: 30 mg via INTRAVENOUS

## 2012-08-18 MED ORDER — FLUTICASONE PROPIONATE HFA 44 MCG/ACT IN AERO: 2.0000 | INHALATION_SPRAY | Freq: Two times a day (BID) | RESPIRATORY_TRACT | Status: DC

## 2012-08-18 MED ORDER — GLYCOPYRROLATE 0.2 MG/ML IJ SOLN
INTRAMUSCULAR | Status: DC | PRN
  Administered 2012-08-18: .4 mg via INTRAVENOUS

## 2012-08-18 MED ORDER — ONDANSETRON HCL 4 MG/2ML IJ SOLN: 4.0000 mg | Freq: Four times a day (QID) | INTRAMUSCULAR | Status: DC | PRN

## 2012-08-18 MED ORDER — POTASSIUM CHLORIDE ER 10 MEQ PO TBCR
10.0000 meq | EXTENDED_RELEASE_TABLET | Freq: Every day | ORAL | Status: DC
  Administered 2012-08-18 – 2012-08-22 (×5): 10 meq via ORAL
  Filled 2012-08-18 (×5): qty 1

## 2012-08-18 MED ORDER — LIDOCAINE HCL (CARDIAC) 20 MG/ML IV SOLN
INTRAVENOUS | Status: DC | PRN
  Administered 2012-08-18: 70 mg via INTRAVENOUS

## 2012-08-18 MED ORDER — LACTATED RINGERS IV SOLN
INTRAVENOUS | Status: DC
  Administered 2012-08-18: 1000 mL via INTRAVENOUS

## 2012-08-18 MED ORDER — PATIENT'S GUIDE TO USING COUMADIN BOOK
Freq: Once | Status: AC
  Administered 2012-08-19: 10:00:00
  Filled 2012-08-18: qty 1

## 2012-08-18 MED ORDER — ACETAMINOPHEN 10 MG/ML IV SOLN
INTRAVENOUS | Status: DC | PRN
  Administered 2012-08-18: 1000 mg via INTRAVENOUS

## 2012-08-18 MED ORDER — GLIPIZIDE 10 MG PO TABS
10.0000 mg | ORAL_TABLET | Freq: Two times a day (BID) | ORAL | Status: DC
  Administered 2012-08-18 – 2012-08-19 (×2): 10 mg via ORAL
  Filled 2012-08-18 (×5): qty 1

## 2012-08-18 MED ORDER — LACTATED RINGERS IV SOLN
INTRAVENOUS | Status: DC | PRN
  Administered 2012-08-18: 18:00:00 via INTRAVENOUS

## 2012-08-18 MED ORDER — WARFARIN SODIUM 2.5 MG PO TABS
2.5000 mg | ORAL_TABLET | Freq: Once | ORAL | Status: DC
  Filled 2012-08-18: qty 1

## 2012-08-18 MED ORDER — POLYETHYLENE GLYCOL 3350 17 G PO PACK
17.0000 g | PACK | Freq: Every morning | ORAL | Status: DC
  Administered 2012-08-18 – 2012-08-22 (×5): 17 g via ORAL
  Filled 2012-08-18 (×5): qty 1

## 2012-08-18 MED ORDER — SENNOSIDES-DOCUSATE SODIUM 8.6-50 MG PO TABS
1.0000 | ORAL_TABLET | Freq: Every morning | ORAL | Status: DC
  Administered 2012-08-18 – 2012-08-22 (×5): 1 via ORAL
  Filled 2012-08-18 (×5): qty 1

## 2012-08-18 MED ORDER — MORPHINE SULFATE 2 MG/ML IJ SOLN
0.5000 mg | INTRAMUSCULAR | Status: DC | PRN
  Filled 2012-08-18 (×2): qty 1

## 2012-08-18 MED ORDER — WARFARIN - PHARMACIST DOSING INPATIENT: Freq: Every day | Status: DC

## 2012-08-18 MED ORDER — WARFARIN VIDEO: Freq: Once | Status: DC

## 2012-08-18 SURGICAL SUPPLY — 51 items
BLADE SAW SAG 73X25 THK (BLADE) ×1
BLADE SAW SGTL 73X25 THK (BLADE) ×1 IMPLANT
BRUSH FEMORAL CANAL (MISCELLANEOUS) IMPLANT
CLOTH BEACON ORANGE TIMEOUT ST (SAFETY) ×2 IMPLANT
DRAPE INCISE IOBAN 66X45 STRL (DRAPES) ×3 IMPLANT
DRAPE ORTHO SPLIT 77X108 STRL (DRAPES) ×2
DRAPE POUCH INSTRU U-SHP 10X18 (DRAPES) ×2 IMPLANT
DRAPE SURG ORHT 6 SPLT 77X108 (DRAPES) ×2 IMPLANT
DRAPE U-SHAPE 47X51 STRL (DRAPES) ×2 IMPLANT
DRAPE WARM FLUID 44X44 (DRAPE) ×1 IMPLANT
DRSG PAD ABDOMINAL 8X10 ST (GAUZE/BANDAGES/DRESSINGS) ×1 IMPLANT
DURAPREP 26ML APPLICATOR (WOUND CARE) ×3 IMPLANT
ELECT BLADE TIP CTD 4 INCH (ELECTRODE) ×2 IMPLANT
ELECT REM PT RETURN 9FT ADLT (ELECTROSURGICAL) ×2
ELECTRODE REM PT RTRN 9FT ADLT (ELECTROSURGICAL) ×1 IMPLANT
EVACUATOR 1/8 PVC DRAIN (DRAIN) ×1 IMPLANT
FACESHIELD LNG OPTICON STERILE (SAFETY) ×8 IMPLANT
GAUZE XEROFORM 5X9 LF (GAUZE/BANDAGES/DRESSINGS) ×2 IMPLANT
GLOVE BIOGEL PI IND STRL 8 (GLOVE) ×1 IMPLANT
GLOVE BIOGEL PI INDICATOR 8 (GLOVE) ×1
GLOVE ORTHO TXT STRL SZ7.5 (GLOVE) ×2 IMPLANT
GOWN PREVENTION PLUS LG XLONG (DISPOSABLE) ×2 IMPLANT
HANDPIECE INTERPULSE COAX TIP (DISPOSABLE)
IMMOBILIZER KNEE 20 (SOFTGOODS)
IMMOBILIZER KNEE 20 THIGH 36 (SOFTGOODS) ×1 IMPLANT
KIT BASIN OR (CUSTOM PROCEDURE TRAY) ×2 IMPLANT
NEEDLE HYPO 22GX1.5 SAFETY (NEEDLE) ×1 IMPLANT
NEEDLE MAYO .5 CIRCLE (NEEDLE) ×2 IMPLANT
NS IRRIG 1000ML POUR BTL (IV SOLUTION) ×3 IMPLANT
PACK TOTAL JOINT (CUSTOM PROCEDURE TRAY) ×2 IMPLANT
PASSER SUT SWANSON 36MM LOOP (INSTRUMENTS) ×2 IMPLANT
POSITIONER SURGICAL ARM (MISCELLANEOUS) ×1 IMPLANT
SET HNDPC FAN SPRY TIP SCT (DISPOSABLE) IMPLANT
SPONGE GAUZE 4X4 12PLY (GAUZE/BANDAGES/DRESSINGS) ×4 IMPLANT
SPONGE LAP 18X18 X RAY DECT (DISPOSABLE) ×1 IMPLANT
STAPLER VISISTAT 35W (STAPLE) ×2 IMPLANT
SUCTION FRAZIER 12FR DISP (SUCTIONS) ×1 IMPLANT
SUT ETHIBOND NAB CT1 #1 30IN (SUTURE) ×8 IMPLANT
SUT VIC AB 0 CT1 27 (SUTURE) ×2
SUT VIC AB 0 CT1 27XBRD ANTBC (SUTURE) ×1 IMPLANT
SUT VIC AB 1 CT1 27 (SUTURE)
SUT VIC AB 1 CT1 27XBRD ANTBC (SUTURE) IMPLANT
SUT VIC AB 1 CTX 36 (SUTURE)
SUT VIC AB 1 CTX36XBRD ANBCTR (SUTURE) IMPLANT
SUT VIC AB 2-0 CT1 36 (SUTURE) ×6 IMPLANT
SYR 30ML LL (SYRINGE) ×1 IMPLANT
TAPE CLOTH SURG 4X10 WHT LF (GAUZE/BANDAGES/DRESSINGS) ×1 IMPLANT
TOWEL OR 17X26 10 PK STRL BLUE (TOWEL DISPOSABLE) ×4 IMPLANT
TOWER CARTRIDGE SMART MIX (DISPOSABLE) IMPLANT
TRAY FOLEY CATH 14FRSI W/METER (CATHETERS) ×1 IMPLANT
WATER STERILE IRR 1500ML POUR (IV SOLUTION) ×2 IMPLANT

## 2012-08-18 NOTE — H&P (View-Only) (Signed)
Reason for Consult:Left femoral neck fracture, displaced Referring Physician: Dr. Myers  Lori [name] is an 79 y.o. female.  HPI: 79 yo female lives in assisted living fell with Left femoral neck fracture  Past Medical History  Diagnosis Date  . Diabetes mellitus   . Hypertension   . Atrial fibrillation   . Hyperlipidemia   . Peripheral neuropathy   . Gastric AVM   . GERD (gastroesophageal reflux disease)   . Diabetic Charcot foot   . Shortness of breath     Past Surgical History  Procedure Laterality Date  . Tonsillectomy    . Lung removal, partial    . Thyroidectomy, partial      Family History  Problem Relation Age of Onset  . Cancer Mother 70    melanoma  . Aneurysm Father     stomach aneurysm    Social History:  reports that she quit smoking about 10 years ago. Her smoking use included Cigarettes. She has a 5 pack-year smoking history. She does not have any smokeless tobacco history on file. She reports that she does not drink alcohol or use illicit drugs.  Allergies:  Allergies  Allergen Reactions  . Aspirin Other (See Comments)    Unknown   . Indapamide Other (See Comments)    Unknown   . Metformin And Related Other (See Comments)    Unknown   . Penicillins Other (See Comments)    Unknown   . Sulfa Antibiotics Other (See Comments)    Unknown   . Tiotropium Bromide Monohydrate Other (See Comments)    Unknown     Medications: I have reviewed the patient's current medications.  Results for orders placed during the hospital encounter of 08/17/12 (from the past 48 hour(s))  CBC WITH DIFFERENTIAL     Status: Abnormal   Collection Time    08/17/12 10:30 PM      Result Value Range   WBC 11.7 (*) 4.0 - 10.5 K/uL   RBC 4.08  3.87 - 5.11 MIL/uL   Hemoglobin 11.7 (*) 12.0 - 15.0 g/dL   HCT 36.4  36.0 - 46.0 %   MCV 89.2  78.0 - 100.0 fL   MCH 28.7  26.0 - 34.0 pg   MCHC 32.1  30.0 - 36.0 g/dL   RDW 12.8  11.5 - 15.5 %   Platelets 262  150 - 400  K/uL   Neutrophils Relative 77  43 - 77 %   Neutro Abs 9.0 (*) 1.7 - 7.7 K/uL   Lymphocytes Relative 16  12 - 46 %   Lymphs Abs 1.9  0.7 - 4.0 K/uL   Monocytes Relative 6  3 - 12 %   Monocytes Absolute 0.7  0.1 - 1.0 K/uL   Eosinophils Relative 1  0 - 5 %   Eosinophils Absolute 0.1  0.0 - 0.7 K/uL   Basophils Relative 0  0 - 1 %   Basophils Absolute 0.0  0.0 - 0.1 K/uL  COMPREHENSIVE METABOLIC PANEL     Status: Abnormal   Collection Time    08/17/12 10:30 PM      Result Value Range   Sodium 136  135 - 145 mEq/L   Potassium 4.3  3.5 - 5.1 mEq/L   Chloride 101  96 - 112 mEq/L   CO2 28  19 - 32 mEq/L   Glucose, Bld 159 (*) 70 - 99 mg/dL   BUN 30 (*) 6 - 23 mg/dL   Creatinine, Ser 1.50 (*)   0.50 - 1.10 mg/dL   Calcium 9.5  8.4 - 10.5 mg/dL   Total Protein 8.0  6.0 - 8.3 g/dL   Albumin 3.4 (*) 3.5 - 5.2 g/dL   AST 25  0 - 37 U/L   ALT 17  0 - 35 U/L   Alkaline Phosphatase 92  39 - 117 U/L   Total Bilirubin 0.2 (*) 0.3 - 1.2 mg/dL   GFR calc non Af Amer 29 (*) >90 mL/min   GFR calc Af Amer 34 (*) >90 mL/min   Comment:            The eGFR has been calculated     using the CKD EPI equation.     This calculation has not been     validated in all clinical     situations.     eGFR's persistently     <90 mL/min signify     possible Chronic Kidney Disease.  PROTIME-INR     Status: None   Collection Time    08/17/12 10:30 PM      Result Value Range   Prothrombin Time 12.5  11.6 - 15.2 seconds   INR 0.94  0.00 - 1.49  ABO/RH     Status: None   Collection Time    08/17/12 11:07 PM      Result Value Range   ABO/RH(D) AB POS    URINALYSIS, MICROSCOPIC ONLY     Status: Abnormal   Collection Time    08/17/12 11:24 PM      Result Value Range   Color, Urine YELLOW  YELLOW   APPearance CLEAR  CLEAR   Specific Gravity, Urine 1.023  1.005 - 1.030   pH 6.0  5.0 - 8.0   Glucose, UA NEGATIVE  NEGATIVE mg/dL   Hgb urine dipstick LARGE (*) NEGATIVE   Bilirubin Urine NEGATIVE  NEGATIVE    Ketones, ur NEGATIVE  NEGATIVE mg/dL   Protein, ur 30 (*) NEGATIVE mg/dL   Urobilinogen, UA 0.2  0.0 - 1.0 mg/dL   Nitrite NEGATIVE  NEGATIVE   Leukocytes, UA NEGATIVE  NEGATIVE   RBC / HPF 0-2  <3 RBC/hpf   Squamous Epithelial / LPF RARE  RARE  TYPE AND SCREEN     Status: None   Collection Time    08/17/12 11:29 PM      Result Value Range   ABO/RH(D) AB POS     Antibody Screen POS     Sample Expiration 08/20/2012     Antibody Identification PENDING     PT AG Type PENDING     DAT, IgG POS     Antibody ID,T [place] PENDING    SURGICAL PCR SCREEN     Status: None   Collection Time    08/18/12  4:01 AM      Result Value Range   MRSA, PCR NEGATIVE  NEGATIVE   Staphylococcus aureus NEGATIVE  NEGATIVE   Comment:            The Xpert SA Assay (FDA     approved for NASAL specimens     in patients over 21 years of age),     is one component of     a comprehensive surveillance     program.  Test performance has     been validated by [facility] for patients greater     than or equal to 1 year old.     It is not intended       to diagnose infection nor to     guide or monitor treatment.  ALBUMIN     Status: Abnormal   Collection Time    08/18/12  4:20 AM      Result Value Range   Albumin 3.0 (*) 3.5 - 5.2 g/dL  BASIC METABOLIC PANEL     Status: Abnormal   Collection Time    08/18/12  4:20 AM      Result Value Range   Sodium 136  135 - 145 mEq/L   Potassium 4.1  3.5 - 5.1 mEq/L   Chloride 103  96 - 112 mEq/L   CO2 25  19 - 32 mEq/L   Glucose, Bld 245 (*) 70 - 99 mg/dL   BUN 25 (*) 6 - 23 mg/dL   Creatinine, Ser 1.13 (*) 0.50 - 1.10 mg/dL   Calcium 9.1  8.4 - 10.5 mg/dL   GFR calc non Af Amer 41 (*) >90 mL/min   GFR calc Af Amer 48 (*) >90 mL/min   Comment:            The eGFR has been calculated     using the CKD EPI equation.     This calculation has not been     validated in all clinical     situations.     eGFR's persistently     <90 mL/min signify      possible Chronic Kidney Disease.  CBC     Status: Abnormal   Collection Time    08/18/12  4:20 AM      Result Value Range   WBC 12.8 (*) 4.0 - 10.5 K/uL   RBC 3.90  3.87 - 5.11 MIL/uL   Hemoglobin 11.0 (*) 12.0 - 15.0 g/dL   HCT 34.7 (*) 36.0 - 46.0 %   MCV 89.0  78.0 - 100.0 fL   MCH 28.2  26.0 - 34.0 pg   MCHC 31.7  30.0 - 36.0 g/dL   RDW 13.0  11.5 - 15.5 %   Platelets 234  150 - 400 K/uL  GLUCOSE, CAPILLARY     Status: Abnormal   Collection Time    08/18/12  7:36 AM      Result Value Range   Glucose-Capillary 145 (*) 70 - 99 mg/dL    Dg Hip Complete Left  08/17/2012  *RADIOLOGY REPORT*  Clinical Data: Left hip pain after fall.  LEFT HIP - COMPLETE 2+ VIEW  Comparison: None.  Findings: There is a transverse subcapital femoral neck fracture on the left with varus angulation.  No evidence of dislocation of the hip.  No underlying focal bone lesion is appreciated.  The pelvis, SI joints, symphysis pubis and pelvic rim appear intact without displacement.  Heterotopic ossification about the right hip. Vascular calcifications.  Degenerative changes in the lower lumbar spine and hips.  IMPRESSION: Transverse fracture of the left femoral neck with varus angulation.   Original Report Authenticated By: William Stevens, M.D.    Dg Chest Port 1 View  08/17/2012  *RADIOLOGY REPORT*  Clinical Data: Left femoral neck fracture.  Preoperative respiratory evaluation.  PORTABLE CHEST - 1 VIEW 08/17/2012 2315 hours:  Comparison: Portable chest x-ray 07/30/2011, 08/21/2009.  Two-view chest x-ray 08/16/2009.  Findings: Cardiac silhouette mildly enlarged but stable, allowing for differences in technique.  Thoracic aorta atherosclerotic, unchanged.  Hilar and mediastinal contours otherwise unremarkable. Mildly prominent bronchovascular markings diffusely, unchanged, consistent with chronic bronchitis.  Blunting of the right costophrenic angle, likely pleuroparenchymal scarring, unchanged. Lungs otherwise clear.     Generalized osseous demineralization.  IMPRESSION: Stable cardiomegaly and pleuroparenchymal scarring at the right lung base.  Stable COPD (chronic bronchitis).  No acute cardiopulmonary disease.   Original Report Authenticated By: Thomas Lawrence, M.D.     Review of Systems  Constitutional: Negative for fever and chills.  Eyes:       Glasses  Respiratory:       Past smoker quit 15  To 20 yrs ago  Surgery for lung cancer  Cardiovascular: Negative.   Gastrointestinal: Negative.   Skin: Negative for rash.  Neurological: Negative for dizziness and headaches.  Endo/Heme/Allergies: Negative.   Psychiatric/Behavioral: Negative for depression and suicidal ideas.   Blood pressure 152/73, pulse 109, temperature 97.8 F (36.6 C), temperature source Oral, resp. rate 16, height 5' 3" (1.6 m), weight 53.162 kg (117 lb 3.2 oz), last menstrual period 07/29/1960, SpO2 98.00%. Physical Exam  Constitutional: She is oriented to person, place, and time. She appears well-developed and well-nourished.  HENT:  Head: Normocephalic and atraumatic.  Eyes: EOM are normal. Pupils are equal, round, and reactive to light.  Neck: Normal range of motion.  Cardiovascular: Normal rate.   Respiratory: Effort normal.  Musculoskeletal:  Left hip short and ER pulses intact  Neurological: She is alert and oriented to person, place, and time.  Skin: Skin is warm and dry.  Psychiatric: She has a normal mood and affect. Her behavior is normal.    Assessment/Plan: Left hip Femoral neck fracture  For hemiarthroplasty today.  Clear Liquid breakfast then NPO YATES,MARK C 08/18/2012, 7:54 AM      

## 2012-08-18 NOTE — Progress Notes (Signed)
Nutrition Brief Note  Reason for Assessment: Consult due to Hip Fracture Protocol  Body mass index is 20.77 kg/(m^2). Patient meets criteria for normal weight based on current BMI. Pt states that she maintains her weight between 115 and 120 lbs and pt was weighed at 117 lbs today. Pt states her appetite is great; she usually eats 3 good meals daily and eats protein-rich foods and dairy daily. Pt does not follow any special diet and does not drink any supplements. Pt has no questions or concerns at this time. Pt is currently NPO.  Labs and medications reviewed. No nutrition interventions warranted at this time. If nutrition issues arise, please consult RD.   Ian Malkin RD, LDN Inpatient Clinical Dietitian Pager: [phone] After Hours Pager: [phone]

## 2012-08-18 NOTE — H&P (Signed)
[facility] History and Physical  Lori [name] BJY:[id number] DOB: 15-Aug-1920 DOA: 08/17/2012  Referring physician: Dr. Norlene Campbell. PCP: Thayer Headings, MD  Specialists: None.  Chief Complaint: Left hip pain.  HPI: Lori [name] is a 79 y.o. female history of atrial fibrillation not on Coumadin secondary to AVM bleed presented with complaints of left hip pain after fall at the living facility. X-rays in the ER reveal left femoral neck fracture. Patient has been admitted for further management. Orthopedic on call Dr. Ophelia Charter has been consulted. Patient states that she tried to walk when she suddenly tripped on her leg and fell. Did not hit her head or lose consciousness. Denies any chest pain shortness of breath or any palpitations.  Review of Systems: As presented in the history of presenting illness, rest negative.  Past Medical History  Diagnosis Date  . Diabetes mellitus   . Hypertension   . Atrial fibrillation   . Hyperlipidemia   . Peripheral neuropathy   . Gastric AVM   . GERD (gastroesophageal reflux disease)   . Diabetic Charcot foot   . Shortness of breath    Past Surgical History  Procedure Laterality Date  . Tonsillectomy    . Lung removal, partial    . Thyroidectomy, partial     Social History:  reports that she quit smoking about 10 years ago. Her smoking use included Cigarettes. She has a 5 pack-year smoking history. She does not have any smokeless tobacco history on file. She reports that she does not drink alcohol or use illicit drugs. Lives at assisted living facility. where does patient live-- Can do ADLs. Can patient participate in ADLs?  Allergies  Allergen Reactions  . Aspirin Other (See Comments)    Unknown   . Indapamide Other (See Comments)    Unknown   . Metformin And Related Other (See Comments)    Unknown   . Penicillins Other (See Comments)    Unknown   . Sulfa Antibiotics Other (See Comments)    Unknown   . Tiotropium Bromide  Monohydrate Other (See Comments)    Unknown     Family History  Problem Relation Age of Onset  . Cancer Mother 41    melanoma  . Aneurysm Father     stomach aneurysm      Prior to Admission medications   Medication Sig Start Date End Date Taking? Authorizing Provider  budesonide (PULMICORT) 180 MCG/ACT inhaler Inhale 1 puff into the lungs 2 (two) times daily.   Yes Historical Provider, MD  calcium carbonate (OS-CAL) 600 MG TABS Take 600 mg by mouth 2 (two) times daily with a meal.   Yes Historical Provider, MD  Cholecalciferol (VITAMIN D-3) 5000 UNITS TABS Take 1 tablet by mouth every morning.    Yes Historical Provider, MD  diltiazem (CARDIZEM) 60 MG tablet Take 60 mg by mouth every 8 (eight) hours. 08/06/11 08/05/13 Yes Sosan Forrestine Him, MD  furosemide (LASIX) 20 MG tablet Take 20 mg by mouth every morning. 08/06/11 08/05/13 Yes Sosan Forrestine Him, MD  glipiZIDE (GLUCOTROL) 5 MG tablet Take 10 mg by mouth 2 (two) times daily before a meal.   Yes Historical Provider, MD  glyBURIDE (DIABETA) 5 MG tablet Take 10 mg by mouth 2 (two) times daily with a meal. 08/06/11  Yes Antonieta Pert, MD  guaiFENesin (MUCINEX) 600 MG 12 hr tablet Take 600 mg by mouth 2 (two) times daily.   Yes Historical Provider, MD  pantoprazole (PROTONIX) 40 MG tablet Take 40  mg by mouth every morning. 08/06/11 08/05/13 Yes Sosan Forrestine Him, MD  polyethylene glycol powder (GLYCOLAX/MIRALAX) powder Take 17 g by mouth every morning.   Yes Historical Provider, MD  potassium chloride (K-DUR) 10 MEQ tablet Take 10 mEq by mouth every morning.   Yes Historical Provider, MD  sennosides-docusate sodium (SENOKOT-S) 8.6-50 MG tablet Take 1 tablet by mouth every morning.   Yes Historical Provider, MD  therapeutic multivitamin-minerals (THERAGRAN-M) tablet Take 1 tablet by mouth every morning.   Yes Historical Provider, MD  traMADol (ULTRAM) 50 MG tablet Take 50 mg by mouth every 8 (eight) hours as needed for pain. pain   Yes Historical  Provider, MD  zolpidem (AMBIEN) 5 MG tablet Take 5-10 mg by mouth at bedtime as needed for sleep.   Yes Historical Provider, MD  Budesonide 90 MCG/ACT inhaler Inhale 1 puff into the lungs 2 (two) times daily. 08/06/11 08/05/12  Antonieta Pert, MD  potassium chloride (K-DUR) 10 MEQ tablet Take 1 tablet (10 mEq total) by mouth daily. 08/06/11 08/05/12  Antonieta Pert, MD   Physical Exam: Filed Vitals:   08/17/12 2204 08/18/12 0212  BP: 139/69 139/61  Pulse: 82 102  Temp: 98.6 F (37 C) 98.7 F (37.1 C)  TempSrc: Oral Oral  Resp: 18 16  SpO2: 98% 92%     General:  Well developed and nourished.  Eyes: Anicteric no pallor.  ENT: No discharge from the ears eyes nose or mouth.  Neck: No mass felt.  Cardiovascular: S1-S2 heard irregular.  Respiratory: No rhonchi no crepitations.  Abdomen: Soft nontender bowel sounds present.  Skin: No rash.  Musculoskeletal: Left hip pain on moving the left leg.  Psychiatric: Appears normal.  Neurologic: Alert awake oriented to time place and person. Moves all extremities.  Labs on Admission:  Basic Metabolic Panel:  Recent Labs Lab 08/17/12 2230  NA 136  K 4.3  CL 101  CO2 28  GLUCOSE 159*  BUN 30*  CREATININE 1.50*  CALCIUM 9.5   Liver Function Tests:  Recent Labs Lab 08/17/12 2230  AST 25  ALT 17  ALKPHOS 92  BILITOT 0.2*  PROT 8.0  ALBUMIN 3.4*   No results found for this basename: LIPASE, AMYLASE,  in the last 168 hours No results found for this basename: AMMONIA,  in the last 168 hours CBC:  Recent Labs Lab 08/17/12 2230  WBC 11.7*  NEUTROABS 9.0*  HGB 11.7*  HCT 36.4  MCV 89.2  PLT 262   Cardiac Enzymes: No results found for this basename: CKTOTAL, CKMB, CKMBINDEX, TROPONINI,  in the last 168 hours  BNP (last 3 results) No results found for this basename: PROBNP,  in the last 8760 hours CBG: No results found for this basename: GLUCAP,  in the last 168 hours  Radiological Exams on Admission: Dg Hip  Complete Left  08/17/2012  *RADIOLOGY REPORT*  Clinical Data: Left hip pain after fall.  LEFT HIP - COMPLETE 2+ VIEW  Comparison: None.  Findings: There is a transverse subcapital femoral neck fracture on the left with varus angulation.  No evidence of dislocation of the hip.  No underlying focal bone lesion is appreciated.  The pelvis, SI joints, symphysis pubis and pelvic rim appear intact without displacement.  Heterotopic ossification about the right hip. Vascular calcifications.  Degenerative changes in the lower lumbar spine and hips.  IMPRESSION: Transverse fracture of the left femoral neck with varus angulation.   Original Report Authenticated By: Burman Nieves, M.D.  Dg Chest Port 1 View  08/17/2012  *RADIOLOGY REPORT*  Clinical Data: Left femoral neck fracture.  Preoperative respiratory evaluation.  PORTABLE CHEST - 1 VIEW 08/17/2012 2315 hours:  Comparison: Portable chest x-ray 07/30/2011, 08/21/2009.  Two-view chest x-ray 08/16/2009.  Findings: Cardiac silhouette mildly enlarged but stable, allowing for differences in technique.  Thoracic aorta atherosclerotic, unchanged.  Hilar and mediastinal contours otherwise unremarkable. Mildly prominent bronchovascular markings diffusely, unchanged, consistent with chronic bronchitis.  Blunting of the right costophrenic angle, likely pleuroparenchymal scarring, unchanged. Lungs otherwise clear.  Generalized osseous demineralization.  IMPRESSION: Stable cardiomegaly and pleuroparenchymal scarring at the right lung base.  Stable COPD (chronic bronchitis).  No acute cardiopulmonary disease.   Original Report Authenticated By: Hulan Saas, M.D.     EKG: Independently reviewed. Atrial fibrillation with controlled rate.  Assessment/Plan Principal Problem:   Fracture of femoral neck, left Active Problems:   Diabetes mellitus   Atrial fibrillation   COPD (chronic obstructive pulmonary disease)   HTN (hypertension)   1. Left femoral neck fracture  status post mechanical fall -orthopedic surgeon Dr. Ophelia Charter has been consulted. Surgery is being planned for later in the day. Patient will be kept n.p.o. after breakfast. Continue pain relief medications. 2. A. fib presently rate controlled not on Coumadin secondary to GI bleed secondary to AVMs - continue rate limiting medications. 3. History of CHF - presently compensated. We'll hold off Lasix until surgery. 4. History of COPD - presently not wheezing. 5. Diabetes mellitus type 2 - continue home medications with sliding-scale coverage 6. Hyperlipidemia - continue present medications.     Code Status: Full code.  Family Communication: None.  Disposition Plan: Admit to inpatient.    KAKRAKANDY,ARSHAD N. [facility] Pager [phone].  If 7PM-7AM, please contact night-coverage [email or web address] Password [facility] 08/18/2012, 2:19 AM

## 2012-08-18 NOTE — Transfer of Care (Signed)
Immediate Anesthesia Transfer of Care Note  Patient: Lori [name]  Procedure(s) Performed: Procedure(s): ARTHROPLASTY BIPOLAR HIP (Left)  Patient Location: PACU  Anesthesia Type:General  Level of Consciousness: awake, alert , oriented and patient cooperative  Airway & Oxygen Therapy: Patient Spontanous Breathing and Patient connected to face mask oxygen  Post-op Assessment: Report given to PACU RN, Post -op Vital signs reviewed and stable and Patient moving all extremities  Post vital signs: Reviewed and stable  Complications: No apparent anesthesia complications

## 2012-08-18 NOTE — Anesthesia Postprocedure Evaluation (Signed)
Anesthesia Post Note  Patient: Lori [name]  Procedure(s) Performed: Procedure(s) (LRB): ARTHROPLASTY BIPOLAR HIP (Left)  Anesthesia type: General  Patient location: PACU  Post pain: Pain level controlled  Post assessment: Post-op Vital signs reviewed  Last Vitals:  Filed Vitals:   08/18/12 2000  BP:   Pulse:   Temp: 37.1 C  Resp:     Post vital signs: Reviewed  Level of consciousness: sedated  Complications: No apparent anesthesia complications

## 2012-08-18 NOTE — Preoperative (Signed)
Beta Blockers   Reason not to administer Beta Blockers:Not Applicable 

## 2012-08-18 NOTE — Progress Notes (Signed)
Pt arrived to 1414 in stable condition, A&Ox3.  C/o pain to L hip upon movement, otherwise tolerable.  Oriented to room, staff and call bell.  Will continue to monitor.  Ardyth Gal, RN 08/18/2012

## 2012-08-18 NOTE — Care Management Note (Addendum)
    Page 1 of 1   08/22/2012     10:08:35 AM   CARE MANAGEMENT NOTE 08/22/2012  Patient:  Lori [name], Lori [name]   Account Number:  [id number]  Date Initiated:  08/18/2012  Documentation initiated by:  [facility]  Subjective/Objective Assessment:   ADMITTED W/FALL.L HIP FX.HX:CHF,COPD,AFIB.     Action/Plan:   [facility].   Anticipated DC Date:  08/23/2012   Anticipated DC Plan:  SKILLED NURSING FACILITY      DC Planning Services  CM consult      Choice offered to / List presented to:             Status of service:  Completed, signed off Medicare Important Message given?   (If response is "NO", the following Medicare IM given date fields will be blank) Date Medicare IM given:   Date Additional Medicare IM given:    Discharge Disposition:  SKILLED NURSING FACILITY  Per UR Regulation:  Reviewed for med. necessity/level of care/duration of stay  If discussed at Long Length of Stay Meetings, dates discussed:    Comments:  08/18/12 KATHY MAHABIR RN,BSN NCM [phone] FOR ORTHOPEDIC SX.WILL CONTINUE TO FOLLOW FOR D/C NEEDS.

## 2012-08-18 NOTE — Progress Notes (Signed)
Pt seen and examined at bedside and hemodynamically stable. Hemiarthroplasty planned for today. CBC and BMP in AM. Will place order for PT/OT after surgery.  Debbora Presto, MD  [facility] Pager [phone]  If 7PM-7AM, please contact night-coverage [email or web address] Password [email or web address]

## 2012-08-18 NOTE — ED Provider Notes (Signed)
History     CSN: [id number]  Arrival date & time 08/17/12  2155   First MD Initiated Contact with Patient 08/17/12 2259      Chief Complaint  Patient presents with  . Fall  . Hip Pain    (Consider location/radiation/quality/duration/timing/severity/associated sxs/prior treatment) HPI -year-old female presents to emergency department via EMS after fall at home.  Patient reports she was getting up from a couch when she suddenly lost her balance and landed on her bottom.  Patient is complaining of left groin/hip pain.  She denies striking her head, no LOC.  No other injuries.  Patient is a high functioning woman, lives in assisted-living apartment.  Past Medical History  Diagnosis Date  . Diabetes mellitus   . Hypertension   . Atrial fibrillation   . Hyperlipidemia   . Peripheral neuropathy   . Gastric AVM   . GERD (gastroesophageal reflux disease)   . Diabetic Charcot foot   . Shortness of breath     Past Surgical History  Procedure Laterality Date  . Tonsillectomy    . Lung removal, partial    . Thyroidectomy, partial      Family History  Problem Relation Age of Onset  . Cancer Mother 41    melanoma  . Aneurysm Father     stomach aneurysm    History  Substance Use Topics  . Smoking status: Former Smoker -- 0.50 packs/day for 10 years    Types: Cigarettes    Quit date: 04/28/2002  . Smokeless tobacco: Not on file  . Alcohol Use: No    OB History   Grav Para Term Preterm Abortions TAB SAB Ect Mult Living                  Review of Systems  All other systems reviewed and are negative.    Allergies  Aspirin; Indapamide; Metformin and related; Penicillins; Sulfa antibiotics; and Tiotropium bromide monohydrate  Home Medications   Current Outpatient Rx  Name  Route  Sig  Dispense  Refill  . budesonide (PULMICORT) 180 MCG/ACT inhaler   Inhalation   Inhale 1 puff into the lungs 2 (two) times daily.         . calcium carbonate (OS-CAL) 600 MG  TABS   Oral   Take 600 mg by mouth 2 (two) times daily with a meal.         . Cholecalciferol (VITAMIN D-3) 5000 UNITS TABS   Oral   Take 1 tablet by mouth every morning.          . diltiazem (CARDIZEM) 60 MG tablet   Oral   Take 60 mg by mouth every 8 (eight) hours.         . furosemide (LASIX) 20 MG tablet   Oral   Take 20 mg by mouth every morning.         Marland Kitchen glipiZIDE (GLUCOTROL) 5 MG tablet   Oral   Take 10 mg by mouth 2 (two) times daily before a meal.         . glyBURIDE (DIABETA) 5 MG tablet   Oral   Take 10 mg by mouth 2 (two) times daily with a meal.         . guaiFENesin (MUCINEX) 600 MG 12 hr tablet   Oral   Take 600 mg by mouth 2 (two) times daily.         . pantoprazole (PROTONIX) 40 MG tablet   Oral   Take 40  mg by mouth every morning.         . polyethylene glycol powder (GLYCOLAX/MIRALAX) powder   Oral   Take 17 g by mouth every morning.         . potassium chloride (K-DUR) 10 MEQ tablet   Oral   Take 10 mEq by mouth every morning.         . sennosides-docusate sodium (SENOKOT-S) 8.6-50 MG tablet   Oral   Take 1 tablet by mouth every morning.         . therapeutic multivitamin-minerals (THERAGRAN-M) tablet   Oral   Take 1 tablet by mouth every morning.         . traMADol (ULTRAM) 50 MG tablet   Oral   Take 50 mg by mouth every 8 (eight) hours as needed for pain. pain         . zolpidem (AMBIEN) 5 MG tablet   Oral   Take 5-10 mg by mouth at bedtime as needed for sleep.         Marland Kitchen EXPIRED: Budesonide 90 MCG/ACT inhaler   Inhalation   Inhale 1 puff into the lungs 2 (two) times daily.   1 each   0   . EXPIRED: potassium chloride (K-DUR) 10 MEQ tablet   Oral   Take 1 tablet (10 mEq total) by mouth daily.   30 tablet   0     BP 139/69  Pulse 82  Temp(Src) 98.6 F (37 C) (Oral)  Resp 18  SpO2 98%  LMP 07/29/1960  Physical Exam  Nursing note and vitals reviewed. Constitutional: She is oriented to  person, place, and time. She appears well-developed and well-nourished.  HENT:  Head: Normocephalic and atraumatic.  Nose: Nose normal.  Mouth/Throat: Oropharynx is clear and moist.  Eyes: Conjunctivae and EOM are normal. Pupils are equal, round, and reactive to light.  Neck: Normal range of motion. Neck supple. No JVD present. No tracheal deviation present. No thyromegaly present.  Cardiovascular: Normal rate, normal heart sounds and intact distal pulses.  Exam reveals no gallop and no friction rub.   No murmur heard. Irreg, irreg  Pulmonary/Chest: Effort normal and breath sounds normal. No stridor. No respiratory distress. She has no wheezes. She has no rales. She exhibits no tenderness.  Abdominal: Soft. Bowel sounds are normal. She exhibits no distension and no mass. There is no tenderness. There is no rebound and no guarding.  Musculoskeletal: Normal range of motion. She exhibits no edema and no tenderness.  Left leg is shortened and slightly externally rotated.  Pain with any attempted range of motion.  She is neurovascularly intact  Lymphadenopathy:    She has no cervical adenopathy.  Neurological: She is alert and oriented to person, place, and time. She exhibits normal muscle tone. Coordination normal.  Skin: Skin is warm and dry. No rash noted. No erythema. No pallor.  Psychiatric: She has a normal mood and affect. Her behavior is normal. Judgment and thought content normal.    ED Course  Procedures (including critical care time)  Labs Reviewed  CBC WITH DIFFERENTIAL - Abnormal; Notable for the following:    WBC 11.7 (*)    Hemoglobin 11.7 (*)    Neutro Abs 9.0 (*)    All other components within normal limits  COMPREHENSIVE METABOLIC PANEL - Abnormal; Notable for the following:    Glucose, Bld 159 (*)    BUN 30 (*)    Creatinine, Ser 1.50 (*)    Albumin 3.4 (*)  Total Bilirubin 0.2 (*)    GFR calc non Af Amer 29 (*)    GFR calc Af Amer 34 (*)    All other components  within normal limits  URINALYSIS, MICROSCOPIC ONLY - Abnormal; Notable for the following:    Hgb urine dipstick LARGE (*)    Protein, ur 30 (*)    All other components within normal limits  PROTIME-INR  TYPE AND SCREEN  ABO/RH   Dg Hip Complete Left  08/17/2012  *RADIOLOGY REPORT*  Clinical Data: Left hip pain after fall.  LEFT HIP - COMPLETE 2+ VIEW  Comparison: None.  Findings: There is a transverse subcapital femoral neck fracture on the left with varus angulation.  No evidence of dislocation of the hip.  No underlying focal bone lesion is appreciated.  The pelvis, SI joints, symphysis pubis and pelvic rim appear intact without displacement.  Heterotopic ossification about the right hip. Vascular calcifications.  Degenerative changes in the lower lumbar spine and hips.  IMPRESSION: Transverse fracture of the left femoral neck with varus angulation.   Original Report Authenticated By: Burman Nieves, M.D.    Dg Chest Port 1 View  08/17/2012  *RADIOLOGY REPORT*  Clinical Data: Left femoral neck fracture.  Preoperative respiratory evaluation.  PORTABLE CHEST - 1 VIEW 08/17/2012 2315 hours:  Comparison: Portable chest x-ray 07/30/2011, 08/21/2009.  Two-view chest x-ray 08/16/2009.  Findings: Cardiac silhouette mildly enlarged but stable, allowing for differences in technique.  Thoracic aorta atherosclerotic, unchanged.  Hilar and mediastinal contours otherwise unremarkable. Mildly prominent bronchovascular markings diffusely, unchanged, consistent with chronic bronchitis.  Blunting of the right costophrenic angle, likely pleuroparenchymal scarring, unchanged. Lungs otherwise clear.  Generalized osseous demineralization.  IMPRESSION: Stable cardiomegaly and pleuroparenchymal scarring at the right lung base.  Stable COPD (chronic bronchitis).  No acute cardiopulmonary disease.   Original Report Authenticated By: Hulan Saas, M.D.     Date: 08/18/2012  Rate: 86  Rhythm: atrial fibrillation  QRS  Axis: normal  Intervals: afib  ST/T Wave abnormalities: normal  Conduction Disutrbances:afib  Narrative Interpretation:   Old EKG Reviewed: unchanged    1. Femoral neck fracture, left, closed, initial encounter       MDM  79 year old female with left femoral neck fracture.  Discuss case with Dr. Ophelia Charter, who plans to take her to the operating room in the morning.  Awaiting discussion with hospitalist for admission.  She is otherwise stable.       Olivia Mackie, MD 08/18/12 [phone]

## 2012-08-18 NOTE — Progress Notes (Signed)
Clinical Social Work Department BRIEF PSYCHOSOCIAL ASSESSMENT 08/18/2012  Patient:  Lori [name], Lori [name]     Account Number:  [id number]     Admit date:  08/17/2012  Clinical Social Worker:  Hattie Perch  Date/Time:  08/18/2012 12:00 M  Referred by:  Physician  Date Referred:  08/18/2012 Referred for  SNF Placement   Other Referral:   Interview type:  Patient Other interview type:   son on telephone    PSYCHOSOCIAL DATA Living Status:  FACILITY Admitted from facility:  [facility] Level of care:  Assisted Living Primary support name:  Lori [name] Primary support relationship to patient:  CHILD, ADULT Degree of support available:   good    CURRENT CONCERNS Current Concerns  Post-Acute Placement   Other Concerns:    SOCIAL WORK ASSESSMENT / PLAN CSW met with patient. patient is alert and oriented X3. patient is from [facility] but is getting a hip repair tonight and will likely need snf placement. patient is agreeable to same but asks that CSW call one of her two sons. CSW called patients son, Lori [name], he confirms that a bed search is fine. He states that patient has been in [facility] but that he had a friend at [facility] and he thought it was nice. agreeable to [place] wide search.   Assessment/plan status:   Other assessment/ plan:   Information/referral to community resources:    PATIENT'S/FAMILY'S RESPONSE TO PLAN OF CARE: patient and son agreeable to snf search and recieving bed offers.

## 2012-08-18 NOTE — Progress Notes (Signed)
ANTICOAGULATION CONSULT NOTE - Initial Consult  Pharmacy Consult for Coumadin Indication: VTE prophylaxis  Allergies  Allergen Reactions  . Aspirin Other (See Comments)    Unknown   . Indapamide Other (See Comments)    Unknown   . Metformin And Related Other (See Comments)    Unknown   . Penicillins Other (See Comments)    Unknown   . Sulfa Antibiotics Other (See Comments)    Unknown   . Tiotropium Bromide Monohydrate Other (See Comments)    Unknown     Patient Measurements: Height: 5\' 3"  (160 cm) Weight: 117 lb 3.2 oz (53.162 kg) IBW/kg (Calculated) : 52.4   Vital Signs: Temp: 97.6 F (36.4 C) (03/20 2046) Temp src: Oral (03/20 1725) BP: 146/85 mmHg (03/20 2046) Pulse Rate: 82 (03/20 2046)  Labs:  Recent Labs  08/17/12 2230 08/18/12 0420  HGB 11.7* 11.0*  HCT 36.4 34.7*  PLT 262 234  LABPROT 12.5  --   INR 0.94  --   CREATININE 1.50* 1.13*    Estimated Creatinine Clearance: 27.4 ml/min (by C-G formula based on Cr of 1.13).   Medical History: Past Medical History  Diagnosis Date  . Diabetes mellitus   . Hypertension   . Atrial fibrillation   . Hyperlipidemia   . Peripheral neuropathy   . Gastric AVM   . GERD (gastroesophageal reflux disease)   . Diabetic Charcot foot   . Shortness of breath     Medications:  Scheduled:  .  ceFAZolin (ANCEF) IV  2 g Intravenous Once  . [COMPLETED] dextrose      . diltiazem  60 mg Oral Q8H  . docusate sodium  100 mg Oral BID  . fluticasone  2 puff Inhalation BID  . glipiZIDE  10 mg Oral BID AC  . insulin aspart  0-9 Units Subcutaneous TID WC  . pantoprazole  40 mg Oral q morning - 10a  . polyethylene glycol  17 g Oral q morning - 10a  . potassium chloride  10 mEq Oral Daily  . senna-docusate  1 tablet Oral q morning - 10a  . [DISCONTINUED] fluticasone  2 puff Inhalation BID    Assessment:  79yo F s/p surgical repair of L hip fracture.  Pharmacy asked to dose Coumadin for VTE ppx.  Baseline INR  was wnl.  Goal of Therapy:  INR 2-3 Monitor platelets by anticoagulation protocol: Yes   Plan:  Start with Coumadin 2.5mg  tonight. Check PT/INR daily. Provide Coumadin education.  Charolotte Eke, PharmD, pager [phone]. 08/18/2012,9:02 PM.

## 2012-08-18 NOTE — Consult Note (Signed)
  Will see patient Thursday AM.  She may have clear liquid breakfast then NPO.  Will discuss Hemiarthroplasty with her  So post op she should be able to immediately bear full weight post op.  xrays reviewed, I have not seen patient yet . Posted for surgery at [facility] Thursday.

## 2012-08-18 NOTE — Progress Notes (Signed)
Hypoglycemic Event  CBG: 47  Treatment: D50 IV 50 mL  Symptoms: Nervous/irritable  Follow-up CBG: Time:NA CBG Result:NA  Possible Reasons for Event: Other: NPO  Comments/MD notified: Pt sent to OR, OR will recheck.    Elyan Vanwieren K  Remember to initiate Hypoglycemia Order Set & complete

## 2012-08-18 NOTE — Interval H&P Note (Signed)
History and Physical Interval Note:  08/18/2012 5:51 PM  Lori [name]  has presented today for surgery, with the diagnosis of left hip fracture  The various methods of treatment have been discussed with the patient and family. After consideration of risks, benefits and other options for treatment, the patient has consented to  Procedure(s): ARTHROPLASTY BIPOLAR HIP (Left) as a surgical intervention .  The patient's history has been reviewed, patient examined, no change in status, stable for surgery.  I have reviewed the patient's chart and labs.  Questions were answered to the patient's satisfaction.     Ayelet Gruenewald C

## 2012-08-18 NOTE — Consult Note (Signed)
Reason for Consult:Left femoral neck fracture, displaced Referring Physician: Dr. [name] is an 79 y.o. female.  HPI: 79 yo female lives in assisted living fell with Left femoral neck fracture  Past Medical History  Diagnosis Date  . Diabetes mellitus   . Hypertension   . Atrial fibrillation   . Hyperlipidemia   . Peripheral neuropathy   . Gastric AVM   . GERD (gastroesophageal reflux disease)   . Diabetic Charcot foot   . Shortness of breath     Past Surgical History  Procedure Laterality Date  . Tonsillectomy    . Lung removal, partial    . Thyroidectomy, partial      Family History  Problem Relation Age of Onset  . Cancer Mother 8    melanoma  . Aneurysm Father     stomach aneurysm    Social History:  reports that she quit smoking about 10 years ago. Her smoking use included Cigarettes. She has a 5 pack-year smoking history. She does not have any smokeless tobacco history on file. She reports that she does not drink alcohol or use illicit drugs.  Allergies:  Allergies  Allergen Reactions  . Aspirin Other (See Comments)    Unknown   . Indapamide Other (See Comments)    Unknown   . Metformin And Related Other (See Comments)    Unknown   . Penicillins Other (See Comments)    Unknown   . Sulfa Antibiotics Other (See Comments)    Unknown   . Tiotropium Bromide Monohydrate Other (See Comments)    Unknown     Medications: I have reviewed the patient's current medications.  Results for orders placed during the hospital encounter of 08/17/12 (from the past 48 hour(s))  CBC WITH DIFFERENTIAL     Status: Abnormal   Collection Time    08/17/12 10:30 PM      Result Value Range   WBC 11.7 (*) 4.0 - 10.5 K/uL   RBC 4.08  3.87 - 5.11 MIL/uL   Hemoglobin 11.7 (*) 12.0 - 15.0 g/dL   HCT 47.8  29.5 - 62.1 %   MCV 89.2  78.0 - 100.0 fL   MCH 28.7  26.0 - 34.0 pg   MCHC 32.1  30.0 - 36.0 g/dL   RDW 30.8  65.7 - 84.6 %   Platelets 262  150 - 400  K/uL   Neutrophils Relative 77  43 - 77 %   Neutro Abs 9.0 (*) 1.7 - 7.7 K/uL   Lymphocytes Relative 16  12 - 46 %   Lymphs Abs 1.9  0.7 - 4.0 K/uL   Monocytes Relative 6  3 - 12 %   Monocytes Absolute 0.7  0.1 - 1.0 K/uL   Eosinophils Relative 1  0 - 5 %   Eosinophils Absolute 0.1  0.0 - 0.7 K/uL   Basophils Relative 0  0 - 1 %   Basophils Absolute 0.0  0.0 - 0.1 K/uL  COMPREHENSIVE METABOLIC PANEL     Status: Abnormal   Collection Time    08/17/12 10:30 PM      Result Value Range   Sodium 136  135 - 145 mEq/L   Potassium 4.3  3.5 - 5.1 mEq/L   Chloride 101  96 - 112 mEq/L   CO2 28  19 - 32 mEq/L   Glucose, Bld 159 (*) 70 - 99 mg/dL   BUN 30 (*) 6 - 23 mg/dL   Creatinine, Ser 9.62 (*)  0.50 - 1.10 mg/dL   Calcium 9.5  8.4 - 16.1 mg/dL   Total Protein 8.0  6.0 - 8.3 g/dL   Albumin 3.4 (*) 3.5 - 5.2 g/dL   AST 25  0 - 37 U/L   ALT 17  0 - 35 U/L   Alkaline Phosphatase 92  39 - 117 U/L   Total Bilirubin 0.2 (*) 0.3 - 1.2 mg/dL   GFR calc non Af Amer 29 (*) >90 mL/min   GFR calc Af Amer 34 (*) >90 mL/min   Comment:            The eGFR has been calculated     using the CKD EPI equation.     This calculation has not been     validated in all clinical     situations.     eGFR's persistently     <90 mL/min signify     possible Chronic Kidney Disease.  PROTIME-INR     Status: None   Collection Time    08/17/12 10:30 PM      Result Value Range   Prothrombin Time 12.5  11.6 - 15.2 seconds   INR 0.94  0.00 - 1.49  ABO/RH     Status: None   Collection Time    08/17/12 11:07 PM      Result Value Range   ABO/RH(D) AB POS    URINALYSIS, MICROSCOPIC ONLY     Status: Abnormal   Collection Time    08/17/12 11:24 PM      Result Value Range   Color, Urine YELLOW  YELLOW   APPearance CLEAR  CLEAR   Specific Gravity, Urine 1.023  1.005 - 1.030   pH 6.0  5.0 - 8.0   Glucose, UA NEGATIVE  NEGATIVE mg/dL   Hgb urine dipstick LARGE (*) NEGATIVE   Bilirubin Urine NEGATIVE  NEGATIVE    Ketones, ur NEGATIVE  NEGATIVE mg/dL   Protein, ur 30 (*) NEGATIVE mg/dL   Urobilinogen, UA 0.2  0.0 - 1.0 mg/dL   Nitrite NEGATIVE  NEGATIVE   Leukocytes, UA NEGATIVE  NEGATIVE   RBC / HPF 0-2  <3 RBC/hpf   Squamous Epithelial / LPF RARE  RARE  TYPE AND SCREEN     Status: None   Collection Time    08/17/12 11:29 PM      Result Value Range   ABO/RH(D) AB POS     Antibody Screen POS     Sample Expiration 08/20/2012     Antibody Identification PENDING     PT AG Type PENDING     DAT, IgG POS     Antibody ID,T [place] PENDING    SURGICAL PCR SCREEN     Status: None   Collection Time    08/18/12  4:01 AM      Result Value Range   MRSA, PCR NEGATIVE  NEGATIVE   Staphylococcus aureus NEGATIVE  NEGATIVE   Comment:            The Xpert SA Assay (FDA     approved for NASAL specimens     in patients over 29 years of age),     is one component of     a comprehensive surveillance     program.  Test performance has     been validated by [employer] for patients greater     than or equal to 45 year old.     It is not intended  to diagnose infection nor to     guide or monitor treatment.  ALBUMIN     Status: Abnormal   Collection Time    08/18/12  4:20 AM      Result Value Range   Albumin 3.0 (*) 3.5 - 5.2 g/dL  BASIC METABOLIC PANEL     Status: Abnormal   Collection Time    08/18/12  4:20 AM      Result Value Range   Sodium 136  135 - 145 mEq/L   Potassium 4.1  3.5 - 5.1 mEq/L   Chloride 103  96 - 112 mEq/L   CO2 25  19 - 32 mEq/L   Glucose, Bld 245 (*) 70 - 99 mg/dL   BUN 25 (*) 6 - 23 mg/dL   Creatinine, Ser 4.78 (*) 0.50 - 1.10 mg/dL   Calcium 9.1  8.4 - 29.5 mg/dL   GFR calc non Af Amer 41 (*) >90 mL/min   GFR calc Af Amer 48 (*) >90 mL/min   Comment:            The eGFR has been calculated     using the CKD EPI equation.     This calculation has not been     validated in all clinical     situations.     eGFR's persistently     <90 mL/min signify      possible Chronic Kidney Disease.  CBC     Status: Abnormal   Collection Time    08/18/12  4:20 AM      Result Value Range   WBC 12.8 (*) 4.0 - 10.5 K/uL   RBC 3.90  3.87 - 5.11 MIL/uL   Hemoglobin 11.0 (*) 12.0 - 15.0 g/dL   HCT 62.1 (*) 30.8 - 65.7 %   MCV 89.0  78.0 - 100.0 fL   MCH 28.2  26.0 - 34.0 pg   MCHC 31.7  30.0 - 36.0 g/dL   RDW 84.6  96.2 - 95.2 %   Platelets 234  150 - 400 K/uL  GLUCOSE, CAPILLARY     Status: Abnormal   Collection Time    08/18/12  7:36 AM      Result Value Range   Glucose-Capillary 145 (*) 70 - 99 mg/dL    Dg Hip Complete Left  08/17/2012  *RADIOLOGY REPORT*  Clinical Data: Left hip pain after fall.  LEFT HIP - COMPLETE 2+ VIEW  Comparison: None.  Findings: There is a transverse subcapital femoral neck fracture on the left with varus angulation.  No evidence of dislocation of the hip.  No underlying focal bone lesion is appreciated.  The pelvis, SI joints, symphysis pubis and pelvic rim appear intact without displacement.  Heterotopic ossification about the right hip. Vascular calcifications.  Degenerative changes in the lower lumbar spine and hips.  IMPRESSION: Transverse fracture of the left femoral neck with varus angulation.   Original Report Authenticated By: Burman Nieves, M.D.    Dg Chest Port 1 View  08/17/2012  *RADIOLOGY REPORT*  Clinical Data: Left femoral neck fracture.  Preoperative respiratory evaluation.  PORTABLE CHEST - 1 VIEW 08/17/2012 2315 hours:  Comparison: Portable chest x-ray 07/30/2011, 08/21/2009.  Two-view chest x-ray 08/16/2009.  Findings: Cardiac silhouette mildly enlarged but stable, allowing for differences in technique.  Thoracic aorta atherosclerotic, unchanged.  Hilar and mediastinal contours otherwise unremarkable. Mildly prominent bronchovascular markings diffusely, unchanged, consistent with chronic bronchitis.  Blunting of the right costophrenic angle, likely pleuroparenchymal scarring, unchanged. Lungs otherwise clear.  Generalized osseous demineralization.  IMPRESSION: Stable cardiomegaly and pleuroparenchymal scarring at the right lung base.  Stable COPD (chronic bronchitis).  No acute cardiopulmonary disease.   Original Report Authenticated By: Hulan Saas, M.D.     Review of Systems  Constitutional: Negative for fever and chills.  Eyes:       Glasses  Respiratory:       Past smoker quit 15  To 20 yrs ago  Surgery for lung cancer  Cardiovascular: Negative.   Gastrointestinal: Negative.   Skin: Negative for rash.  Neurological: Negative for dizziness and headaches.  Endo/Heme/Allergies: Negative.   Psychiatric/Behavioral: Negative for depression and suicidal ideas.   Blood pressure 152/73, pulse 109, temperature 97.8 F (36.6 C), temperature source Oral, resp. rate 16, height 5\' 3"  (1.6 m), weight 53.162 kg (117 lb 3.2 oz), last menstrual period 07/29/1960, SpO2 98.00%. Physical Exam  Constitutional: She is oriented to person, place, and time. She appears well-developed and well-nourished.  HENT:  Head: Normocephalic and atraumatic.  Eyes: EOM are normal. Pupils are equal, round, and reactive to light.  Neck: Normal range of motion.  Cardiovascular: Normal rate.   Respiratory: Effort normal.  Musculoskeletal:  Left hip short and ER pulses intact  Neurological: She is alert and oriented to person, place, and time.  Skin: Skin is warm and dry.  Psychiatric: She has a normal mood and affect. Her behavior is normal.    Assessment/Plan: Left hip Femoral neck fracture  For hemiarthroplasty today.  Clear Liquid breakfast then NPO Lori [name] C 08/18/2012, 7:54 AM

## 2012-08-18 NOTE — Brief Op Note (Signed)
08/17/2012 - 08/18/2012  7:23 PM  PATIENT:  Lori [name]  79 y.o. female  PRE-OPERATIVE DIAGNOSIS:  left hip fracture  POST-OPERATIVE DIAGNOSIS:  left hip fracture  PROCEDURE:  Procedure(s): ARTHROPLASTY BIPOLAR HIP (Left)  Press fit monopolar  SURGEON:  Surgeon(s) and Role:    * Eldred Manges, MD - Primary  PHYSICIAN ASSISTANT:   ASSISTANTS: none   ANESTHESIA:   local and general  EBL:  Total I/O In: -  Out: 50 [Blood:50]  BLOOD ADMINISTERED:none  DRAINS: none   LOCAL MEDICATIONS USED:  MARCAINE     SPECIMEN:  No Specimen  DISPOSITION OF SPECIMEN:  N/A  COUNTS:  YES  TOURNIQUET:  * No tourniquets in log *  DICTATION: .Other Dictation: Dictation Number [id number]  PLAN OF CARE: still inpatient  PATIENT DISPOSITION:  PACU - hemodynamically stable.   Delay start of Pharmacological VTE agent (>24hrs) due to surgical blood loss or risk of bleeding: yes

## 2012-08-18 NOTE — Anesthesia Preprocedure Evaluation (Addendum)
Anesthesia Evaluation  Patient identified by MRN, date of birth, ID band Patient awake    Reviewed: Allergy & Precautions, H&P , NPO status , Patient's Chart, lab work & pertinent test results  History of Anesthesia Complications (+) MALIGNANT HYPERTHERMIA  Airway Mallampati: III TM Distance: >3 FB Neck ROM: Full    Dental  (+) Teeth Intact and Dental Advisory Given   Pulmonary shortness of breath, COPDformer smoker,  Partial lobectomy per chart breath sounds clear to auscultation  Pulmonary exam normal       Cardiovascular hypertension, Pt. on medications +CHF + dysrhythmias Atrial Fibrillation Rhythm:Regular     Neuro/Psych  Neuromuscular disease negative neurological ROS  negative psych ROS   GI/Hepatic Neg liver ROS, GERD-  Medicated,Hx of gastric AVM   Endo/Other  negative endocrine ROSdiabetes, Type 2, Oral Hypoglycemic Agents  Renal/GU negative Renal ROS  negative genitourinary   Musculoskeletal negative musculoskeletal ROS (+)   Abdominal   Peds  Hematology negative hematology ROS (+)   Anesthesia Other Findings SaO2 90-92% on RA  Reproductive/Obstetrics negative OB ROS                        Anesthesia Physical Anesthesia Plan  ASA: III  Anesthesia Plan: General   Post-op Pain Management:    Induction: Intravenous  Airway Management Planned: Oral ETT  Additional Equipment:   Intra-op Plan:   Post-operative Plan: Extubation in OR  Informed Consent: I have reviewed the patients History and Physical, chart, labs and discussed the procedure including the risks, benefits and alternatives for the proposed anesthesia with the patient or authorized representative who has indicated his/her understanding and acceptance.   Dental advisory given  Plan Discussed with: CRNA  Anesthesia Plan Comments:         Anesthesia Quick Evaluation

## 2012-08-19 DIAGNOSIS — I509 Heart failure, unspecified: Secondary | ICD-10-CM

## 2012-08-19 DIAGNOSIS — I4891 Unspecified atrial fibrillation: Secondary | ICD-10-CM

## 2012-08-19 LAB — GLUCOSE, CAPILLARY: Glucose-Capillary: 208 mg/dL — ABNORMAL HIGH (ref 70–99)

## 2012-08-19 LAB — CBC
MCV: 90.4 fL (ref 78.0–100.0)
Platelets: 192 10*3/uL (ref 150–400)
RDW: 13.2 % (ref 11.5–15.5)
WBC: 10.4 10*3/uL (ref 4.0–10.5)

## 2012-08-19 LAB — PROTIME-INR: Prothrombin Time: 13.3 seconds (ref 11.6–15.2)

## 2012-08-19 LAB — BASIC METABOLIC PANEL
Chloride: 103 mEq/L (ref 96–112)
Creatinine, Ser: 1.1 mg/dL (ref 0.50–1.10)
GFR calc Af Amer: 50 mL/min — ABNORMAL LOW (ref >90)

## 2012-08-19 MED ORDER — HYDROCODONE-ACETAMINOPHEN 5-325 MG PO TABS: 1.0000 | ORAL_TABLET | ORAL | Status: DC | PRN

## 2012-08-19 MED ORDER — WARFARIN SODIUM 2.5 MG PO TABS
2.5000 mg | ORAL_TABLET | Freq: Once | ORAL | Status: AC
  Administered 2012-08-19: 2.5 mg via ORAL
  Filled 2012-08-19: qty 1

## 2012-08-19 MED ORDER — WARFARIN SODIUM 5 MG PO TABS: 5.0000 mg | ORAL_TABLET | Freq: Every day | ORAL | Status: DC

## 2012-08-19 NOTE — Progress Notes (Signed)
Patient ID: Lori [name], female   DOB: September 02, 1920, 79 y.o.   MRN: [id number]  [facility] PROGRESS NOTE  Lori [name]:[id number] DOB: January 14, 1921 DOA: 08/17/2012 PCP: Thayer Headings, MD  Brief narrative: Patient is 79 year old female who presents to [place] [facility] ED after an episode of fall in assisted living facility. She was brought to emergency department and found to have sustained left displaced femoral neck fracture requiring hemiarthroplasty.  Principal Problem:   Fracture of femoral neck, left - Postop day 1, patient doing well and is clinically stable - We'll place order for PT evaluation, PT to continue while inpatient - Analgesia as needed for symptom relief Active Problems:   Diabetes mellitus - Stable with one episode of hypoglycemia, will discontinue glipizide at this time and continue sliding scale insulin   Atrial fibrillation - In sinus rhythm this morning, rate controlled, continue Coumadin per pharmacy   COPD (chronic obstructive pulmonary disease) - Clinically stable and maintaining oxygen saturations at target range   HTN (hypertension) - Reasonable inpatient control   Acute renal failure - Secondary to prerenal etiology, IV fluids provided, patient responded well - Creatinine within normal limits this morning   Acute on chronic blood loss anemia - Component of blood loss, postop - Continue to monitor hemoglobin and hematocrit daily  Consultants:  Ortho  Procedures/Studies: Dg Hip Complete Left 08/17/2012   Transverse fracture of the left femoral neck with varus angulation.    Dg Pelvis Portable 08/18/2012   Anatomic alignment post left hip arthroplasty without acute complicating features.    Dg Chest Port 1 View 08/17/2012  Stable cardiomegaly and pleuroparenchymal scarring at the right lung base.  Stable COPD (chronic bronchitis).  No acute cardiopulmonary disease.     Antibiotics:  None  Code Status: Full Family Communication: Pt and son at  bedside Disposition Plan: Home when medically stable  HPI/Subjective: No events overnight.   Objective: Filed Vitals:   08/18/12 2015 08/18/12 2046 08/19/12 0457 08/19/12 0810  BP: 132/61 146/85 133/60   Pulse: 86 82 82   Temp:  97.6 F (36.4 C) 97.8 F (36.6 C)   TempSrc:   Oral   Resp: 18  18   Height:      Weight:      SpO2: 100% 97% 100% 100%    Intake/Output Summary (Last 24 hours) at 08/19/12 1039 Last data filed at 08/19/12 0630  Gross per 24 hour  Intake 1568.34 ml  Output   1540 ml  Net  28.34 ml    Exam:   General:  Pt is alert, follows commands appropriately, not in acute distress  Cardiovascular: Regular rate and rhythm, S1/S2, no murmurs, no rubs, no gallops  Respiratory: Clear to auscultation bilaterally, no wheezing, no crackles, no rhonchi  Abdomen: Soft, non tender, non distended, bowel sounds present, no guarding  Extremities: No edema, pulses DP and PT palpable bilaterally  Neuro: Grossly nonfocal  Data Reviewed: Basic Metabolic Panel:  Recent Labs Lab 08/17/12 2230 08/18/12 0420 08/19/12 0424  NA 136 136 136  K 4.3 4.1 4.6  CL 101 103 103  CO2 28 25 29   GLUCOSE 159* 245* 155*  BUN 30* 25* 18  CREATININE 1.50* 1.13* 1.10  CALCIUM 9.5 9.1 8.7   Liver Function Tests:  Recent Labs Lab 08/17/12 2230 08/18/12 0420  AST 25  --   ALT 17  --   ALKPHOS 92  --   BILITOT 0.2*  --   PROT 8.0  --  ALBUMIN 3.4* 3.0*  CBC:  Recent Labs Lab 08/17/12 2230 08/18/12 0420 08/19/12 0424  WBC 11.7* 12.8* 10.4  NEUTROABS 9.0*  --   --   HGB 11.7* 11.0* 10.2*  HCT 36.4 34.7* 31.9*  MCV 89.2 89.0 90.4  PLT 262 234 192   CBG:  Recent Labs Lab 08/18/12 1713 08/18/12 1752 08/18/12 2016 08/18/12 2056 08/19/12 0757  GLUCAP 47* 182* 139* 158* 125*     Scheduled Meds: . diltiazem  60 mg Oral Q8H  . docusate sodium  100 mg Oral BID  . fluticasone  2 puff Inhalation BID  . glipiZIDE  10 mg Oral BID AC  . insulin aspart  0-9  Units Subcutaneous TID WC  . pantoprazole  40 mg Oral q morning - 10a  . polyethylene glycol  17 g Oral q morning - 10a  . potassium chloride  10 mEq Oral Daily  . senna-docusate  1 tablet Oral q morning - 10a  . warfarin  2.5 mg Oral Once   Continuous Infusions: . lactated ringers 1,000 mL (08/18/12 2024)    Debbora Presto, MD  [facility] Pager [phone]  If 7PM-7AM, please contact night-coverage [email or web address] Password [facility] 08/19/2012, 10:39 AM   LOS: 2 days

## 2012-08-19 NOTE — Progress Notes (Signed)
ANTICOAGULATION CONSULT NOTE - Follow Up Consult  Pharmacy Consult for Coumadin Indication: VTE prophylaxis s/p L hemiarthroplasty  Allergies  Allergen Reactions  . Aspirin Other (See Comments)    Unknown   . Indapamide Other (See Comments)    Unknown   . Metformin And Related Other (See Comments)    Unknown   . Penicillins Other (See Comments)    Unknown   . Sulfa Antibiotics Other (See Comments)    Unknown   . Tiotropium Bromide Monohydrate Other (See Comments)    Unknown     Patient Measurements: Height: 5\' 3"  (160 cm) Weight: 117 lb 3.2 oz (53.162 kg) IBW/kg (Calculated) : 52.4  Vital Signs: Temp: 97.8 F (36.6 C) (03/21 0457) Temp src: Oral (03/21 0457) BP: 133/60 mmHg (03/21 0457) Pulse Rate: 82 (03/21 0457)  Labs:  Recent Labs  08/17/12 2230 08/18/12 0420 08/19/12 0424  HGB 11.7* 11.0* 10.2*  HCT 36.4 34.7* 31.9*  PLT 262 234 192  LABPROT 12.5  --   --   INR 0.94  --   --   CREATININE 1.50* 1.13* 1.10    Estimated Creatinine Clearance: 28.1 ml/min (by C-G formula based on Cr of 1.1).   Assessment: Lori [name] presented 3/19 with L hip pain s/p mechanical fall, Xray revealed L femoral neck fracture. Pt underwent hemiarthroplasty 3/20, Coumadin started post-op for VTE prophylaxis. Of note, pt has a h/o afib but wast not anticoagulated 2/2 history of AVM bleed. Coumadin score = 0  Coumadin 2.5mg  was ordered yesterday but unintentionally scheduled to start 3/21 so dose was not given.  Daily INR scheduled to start tomorrow.  MD already ordered Coumadin 5mg  daily at discharge, given patient's Coumadin score and history of AVM bleed - would consider lowering the dose - pharmacy will f/u and provide recommendation.  Goal of Therapy:  INR 2-3 Monitor platelets by anticoagulation protocol: Yes   Plan:   Coumadin 2.5m po x today at 1200   Daily PT/INR  Will provide Coumadin education to patient today  Geoffry Paradise, PharmD, BCPS Pager: [phone] 8:32  AM Pharmacy #: [phone]

## 2012-08-19 NOTE — Evaluation (Signed)
Occupational Therapy Evaluation Patient Details Name: Lori [name] MRN: [id number] DOB: 1920-12-18 Today's Date: 08/19/2012 Time: 7829-5621    OT Assessment / Plan / Recommendation Clinical Impression  Pt presents to OT with decreased I with ADL activity s/p fall in which she broke her hip. Pt will benefit from skilled OT to increase I with ADL activity and return to PLOF    OT Assessment  Patient needs continued OT Services    Follow Up Recommendations  SNF             Frequency  Min 1X/week    Precautions / Restrictions Precautions Precautions: Fall Restrictions Other Position/Activity Restrictions: WBAT       ADL  Grooming: Performed;Wash/dry face;Set up Where Assessed - Grooming: Unsupported sitting Lower Body Dressing: Simulated;+2 Total assistance Lower Body Dressing: Patient Percentage: 50% Toilet Transfer: Simulated;+2 Total assistance;Other (comment) (bed to chair) Toilet Transfer: Patient Percentage: 40% Toilet Transfer Method: Sit to stand ADL Comments: Pt will likely need ST SNF as she does live at ALF. OT did share with family    OT Diagnosis: Generalized weakness;Acute pain  OT Problem List: Decreased strength;Decreased activity tolerance;Pain OT Treatment Interventions: Self-care/ADL training;Patient/family education;DME and/or AE instruction   OT Goals Acute Rehab OT Goals OT Goal Formulation: With patient Time For Goal Achievement: 09/02/12 ADL Goals Pt Will Perform Grooming: with min assist;Standing at sink ADL Goal: Grooming - Progress: Goal set today Pt Will Transfer to Toilet: with min assist;3-in-1 ADL Goal: Toilet Transfer - Progress: Goal set today  Visit Information  Last OT Received On: 08/19/12    Subjective Data  Subjective: i live at [facility]   Prior Functioning     Home Living Type of Home: Assisted living Home Layout: One level Bathroom Shower/Tub: Health visitor: Handicapped height Home Adaptive  Equipment: Grab bars around toilet;Walker - rolling;Walker - four wheeled Prior Function Level of Independence: Independent Communication Communication: No difficulties         Vision/Perception Vision - History Patient Visual Report: No change from baseline   Cognition  Cognition Overall Cognitive Status: Appears within functional limits for tasks assessed/performed Arousal/Alertness: Awake/alert Orientation Level: Disoriented to;Time Behavior During Session: WFL for tasks performed    Extremity/Trunk Assessment Right Upper Extremity Assessment RUE ROM/Strength/Tone: [facility] for tasks assessed Left Upper Extremity Assessment LUE ROM/Strength/Tone: WFL for tasks assessed     Mobility Bed Mobility Bed Mobility: Rolling Left;Supine to Sit Rolling Left: 1: +2 Total assist Rolling Left: Patient Percentage: 40% Supine to Sit: 1: +2 Total assist Supine to Sit: Patient Percentage: 40% Transfers Transfers: Sit to Stand;Stand to Sit Sit to Stand: 1: +2 Total assist;With upper extremity assist;From bed Sit to Stand: Patient Percentage: 60% Stand to Sit: 1: +2 Total assist;With upper extremity assist;To toilet;With armrests Stand to Sit: Patient Percentage: 60%           End of Session OT - End of Session Activity Tolerance: Patient tolerated treatment well Patient left: in chair;with call bell/phone within reach;with family/visitor present Nurse Communication: Mobility status  GO     Alba Cory 08/19/2012, 11:36 AM

## 2012-08-19 NOTE — Progress Notes (Signed)
Patient has only voided 40mL since her foley came out today at 1500. RN has performed bladder scan twice. At 2126 bladder scan volume was 170 and at 2329 it was 155. Patient states she is not in any discomfort or tenderness in her bladder region. RN paged NP, Burnadette Peter, to inform her of patient's inability to void. She stated it was okay to wait to perform the bladder scan at 0400 if patient does not void before then. NP is only concerned if bladder scan volume goes above 300. RN will continue to monitor patient and re-address bathroom issues.

## 2012-08-19 NOTE — Progress Notes (Signed)
RN called ortho tech about trapeze bar, they stated they would be up tonight to assess the bed and see if they could install it.

## 2012-08-19 NOTE — Evaluation (Signed)
Physical Therapy Evaluation Patient Details Name: Lori [name] MRN: [id number] DOB: 1920-06-13 Today's Date: 08/19/2012 Time: 4098-1191 PT Time Calculation (min): 21 min  PT Assessment / Plan / Recommendation Clinical Impression  Pt s/p L hip hemiarthoplasty after fall resulting in L femoral fx.  Pt would benefit from acute PT services in order to improve independence with transfers and ambulation to prepare for d/c to SNF.      PT Assessment  Patient needs continued PT services    Follow Up Recommendations  Supervision/Assistance - 24 hour;SNF    Does the patient have the potential to tolerate intense rehabilitation      Barriers to Discharge        Equipment Recommendations  None recommended by PT    Recommendations for Other Services     Frequency Min 3X/week    Precautions / Restrictions Precautions Precautions: Fall;Posterior Hip Precaution Comments: reviewed posterior hip precautions with pt, pillow between knees to assist Restrictions Other Position/Activity Restrictions: WBAT   Pertinent Vitals/Pain Moderate L hip pain with mobility, RN notified and to bring meds      Mobility  Bed Mobility Bed Mobility: Supine to Sit Rolling Left: 1: +2 Total assist Rolling Left: Patient Percentage: 40% Supine to Sit: 1: +2 Total assist Supine to Sit: Patient Percentage: 40% Details for Bed Mobility Assistance: pt semisidelying on R side upon entering, assisted pt with supine position then verbal cues and assist for getting to EOB within precautions Transfers Transfers: Stand to Sit;Sit to Stand Sit to Stand: 1: +2 Total assist;With upper extremity assist;From bed Sit to Stand: Patient Percentage: 60% Stand to Sit: 1: +2 Total assist;With upper extremity assist;With armrests;To chair/3-in-1 Stand to Sit: Patient Percentage: 60% Details for Transfer Assistance: verbal cues for safe technique Ambulation/Gait Ambulation/Gait Assistance: 1: +2 Total assist Ambulation/Gait:  Patient Percentage: 70% Ambulation Distance (Feet): 5 Feet Assistive device: Rolling walker Ambulation/Gait Assistance Details: increased time and effort, verbal cues for sequence, RW distance, using UEs through RW to assist with pain control Gait Pattern: Step-to pattern;Decreased stance time - left;Antalgic;Decreased step length - right    Exercises     PT Diagnosis: Difficulty walking;Acute pain  PT Problem List: Decreased strength;Decreased activity tolerance;Decreased mobility;Pain;Decreased knowledge of precautions;Decreased knowledge of use of DME PT Treatment Interventions: DME instruction;Gait training;Functional mobility training;Therapeutic activities;Therapeutic exercise;Patient/family education   PT Goals Acute Rehab PT Goals PT Goal Formulation: With patient Time For Goal Achievement: 09/02/12 Potential to Achieve Goals: Good Pt will go Supine/Side to Sit: with supervision PT Goal: Supine/Side to Sit - Progress: Goal set today Pt will go Sit to Supine/Side: with supervision PT Goal: Sit to Supine/Side - Progress: Goal set today Pt will go Sit to Stand: with supervision PT Goal: Sit to Stand - Progress: Goal set today Pt will go Stand to Sit: with supervision PT Goal: Stand to Sit - Progress: Goal set today Pt will Ambulate: 51 - 150 feet;with supervision;with rolling walker PT Goal: Ambulate - Progress: Goal set today Pt will Perform Home Exercise Program: with supervision, verbal cues required/provided PT Goal: Perform Home Exercise Program - Progress: Goal set today  Visit Information  Last PT Received On: 08/19/12 Assistance Needed: +2    Subjective Data  Subjective: I those were my 2 sons.   Prior Functioning  Home Living Type of Home: Assisted living Home Layout: One level Bathroom Shower/Tub: Health visitor: Handicapped height Home Adaptive Equipment: Grab bars around toilet;Walker - rolling;Walker - four wheeled Prior Function Level of  Independence: Independent with assistive device(s) Comments: pt reports using walker Communication Communication: No difficulties    Cognition  Cognition Overall Cognitive Status: Appears within functional limits for tasks assessed/performed Arousal/Alertness: Awake/alert Orientation Level: Disoriented to;Time Behavior During Session: [facility] for tasks performed    Extremity/Trunk Assessment Right Upper Extremity Assessment RUE ROM/Strength/Tone: [facility] for tasks assessed Left Upper Extremity Assessment LUE ROM/Strength/Tone: WFL for tasks assessed Right Lower Extremity Assessment RLE ROM/Strength/Tone: [facility] for tasks assessed Left Lower Extremity Assessment LLE ROM/Strength/Tone: Deficits;Unable to fully assess;Due to pain;Due to precautions LLE ROM/Strength/Tone Deficits: assist for bed mobility   Balance    End of Session PT - End of Session Activity Tolerance: Patient limited by pain;Patient limited by fatigue Patient left: in chair;with call bell/phone within reach;with family/visitor present Nurse Communication: Patient requests pain meds;Other (comment) (posterior hip precautions)  GP     Kirt Chew,KATHrine E 08/19/2012, 12:06 PM Zenovia Jarred, PT, DPT 08/19/2012 Pager: [phone]

## 2012-08-19 NOTE — Progress Notes (Signed)
CSW spoke with patient's son and provided bed offers. He is requesting [facility]. CSW called [facility] and they will hold a bed for patient for Monday. Lori [name] MSW, LCSW [phone]

## 2012-08-19 NOTE — Progress Notes (Signed)
Subjective: 1 Day Post-Op Procedure(s) (LRB): ARTHROPLASTY BIPOLAR HIP (Left) Patient reports pain as mild.  Repositioned in bed and loosened the knee immobilizer. Not up yet with PT  Objective: Vital signs in last 24 hours: Temp:  [97.6 F (36.4 C)-98.7 F (37.1 C)] 98.3 F (36.8 C) (03/21 1300) Pulse Rate:  [77-93] 92 (03/21 1300) Resp:  [12-22] 20 (03/21 1300) BP: (125-146)/(59-85) 126/59 mmHg (03/21 1300) SpO2:  [93 %-100 %] 100 % (03/21 1300)  Intake/Output from previous day: 03/20 0701 - 03/21 0700 In: 1568.3 [P.O.:120; I.V.:1448.3] Out: 1540 [Urine:1440; Blood:100] Intake/Output this shift: Total I/O In: 240 [P.O.:240] Out: 150 [Urine:150]   Recent Labs  08/17/12 2230 08/18/12 0420 08/19/12 0424  HGB 11.7* 11.0* 10.2*    Recent Labs  08/18/12 0420 08/19/12 0424  WBC 12.8* 10.4  RBC 3.90 3.53*  HCT 34.7* 31.9*  PLT 234 192    Recent Labs  08/18/12 0420 08/19/12 0424  NA 136 136  K 4.1 4.6  CL 103 103  CO2 25 29  BUN 25* 18  CREATININE 1.13* 1.10  GLUCOSE 245* 155*  CALCIUM 9.1 8.7    Recent Labs  08/17/12 2230 08/19/12 0940  INR 0.94 1.02    Neurovascular intact Sensation intact distally Dorsiflexion/Plantar flexion intact Incision: dressing C/D/I  Assessment/Plan: 1 Day Post-Op Procedure(s) (LRB): ARTHROPLASTY BIPOLAR HIP (Left) Up with therapy NHP when stable ORTHO INSTRUCTIONS:  WBAT LEFT LEG, COUMADIN FOR VTE PROPHYLAXIS, OV 2 WEEKS WITH YATES, RX VICODIN FOR PAIN, POSTERIOR THR PRECAUTIONS, DRESSING CHANGED DAILY OR AS NEEDED.  STAPLES OUT 2WEEKS POST OP, ICE PACKS AS NEEDED TO HIP  Constancia Geeting M 08/19/2012, 1:24 PM

## 2012-08-19 NOTE — Op Note (Signed)
Lori [name], Lori [name] NO.:  [id number]  MEDICAL RECORD NO.:  [id number]  LOCATION:  1414                         FACILITY:  [facility]  PHYSICIAN:  Ramil Edgington C. Ophelia Charter, M.D.    DATE OF BIRTH:  Oct 11, 1920  DATE OF PROCEDURE:  08/18/2012 DATE OF DISCHARGE:                              OPERATIVE REPORT   PREOPERATIVE DIAGNOSIS:  Left displaced femoral neck fracture.  POSTOPERATIVE DIAGNOSIS:  Left displaced femoral neck fracture.  PROCEDURE:  Press-fit monopolar hemiarthroplasty, DePuy #4 stem, +0 neck, 45-mm ball.  ESTIMATED BLOOD LOSS:  Minimal.  ANESTHESIA:  General plus 10 mL of Marcaine skin local.  COMPLICATIONS:  None.  PROCEDURE:  After induction of general anesthesia, orotracheal intubation, preoperative Ancef prophylaxis, the patient was placed in lateral position with prepping with DuraPrep after 10-15 drape was applied and usual split sheets, drapes, impervious stockinette, Coban, and sterile skin marker and Betadine, Steri-Drape x2 was applied.  Time-out procedure was completed.  Ancef was given prophylactically. Posterior approach was made.  Gluteus maximus fibers were split in line with their normal course.  Tensor fascia was opened just past the trochanter for 2 cm.  Charnley retractor was placed.  Piriformis was tagged.  Sciatic nerve was localized and protected.  After piriformis was tagged, it was divided.  Posterior capsule was opened.  Hematoma was evacuated.  Head was removed with a corkscrew after the neck was cut 1 fingerbreadth above the lesser trochanter.  Excess bone was removed. There was no bone left in the acetabulum.  Canal starter reamer followed by progressive broaching up to a #4 and permanent stem was inserted. Neck was cut 1 fingerbreadth of the above the lesser trochanter and trial size showed excellent restoration of length with the 0 length and permanent ball was inserted and impacted.  Hip was reduced with protection of the  sciatic nerve.  Hip would flexed to 90 degrees, internally rotate to 80 degrees with good stability.  After irrigation with saline solution, piriformis was repaired with gluteus medius with the empty needle and the Ethibond #1 suture.  A #1 Ethibond in the tensor fascia, gluteus maximus fascia, 2-0 on the subcutaneous tissue, and skin staples closure.  The patient tolerated the procedure well. Instrument count and needle count were correct.    Disa Riedlinger C. Ophelia Charter, M.D.    MCY/MEDQ  D:  08/18/2012  T:  08/19/2012  Job:  [id number]

## 2012-08-20 LAB — CBC
HCT: 29.3 % — ABNORMAL LOW (ref 36.0–46.0)
MCH: 28.7 pg (ref 26.0–34.0)
MCHC: 32.1 g/dL (ref 30.0–36.0)
MCV: 89.6 fL (ref 78.0–100.0)
RDW: 13.2 % (ref 11.5–15.5)

## 2012-08-20 LAB — BASIC METABOLIC PANEL
BUN: 19 mg/dL (ref 6–23)
Calcium: 8.7 mg/dL (ref 8.4–10.5)
Creatinine, Ser: 1.06 mg/dL (ref 0.50–1.10)
GFR calc Af Amer: 52 mL/min — ABNORMAL LOW (ref >90)
GFR calc non Af Amer: 45 mL/min — ABNORMAL LOW (ref >90)
Glucose, Bld: 92 mg/dL (ref 70–99)

## 2012-08-20 MED ORDER — WARFARIN SODIUM 2.5 MG PO TABS
2.5000 mg | ORAL_TABLET | Freq: Once | ORAL | Status: AC
  Administered 2012-08-20: 2.5 mg via ORAL
  Filled 2012-08-20: qty 1

## 2012-08-20 NOTE — Progress Notes (Signed)
08/20/12-22:30-Pt is being triaged off unit due to no telemetry monitor orders. Both sons (Johnny & Jared)notified via phone call to each son that the pt was being moved to room #1529.

## 2012-08-20 NOTE — Progress Notes (Signed)
ANTICOAGULATION CONSULT NOTE - Follow Up  Pharmacy Consult for Coumadin Indication: VTE prophylaxis s/p L hemiarthroplasty  Patient Measurements: Height: 5\' 3"  (160 cm) Weight: 117 lb 3.2 oz (53.162 kg) IBW/kg (Calculated) : 52.4  Labs:  Recent Labs  08/17/12 2230 08/18/12 0420 08/19/12 0424 08/19/12 0940 08/20/12 0437  HGB 11.7* 11.0* 10.2*  --  9.4*  HCT 36.4 34.7* 31.9*  --  29.3*  PLT 262 234 192  --  176  LABPROT 12.5  --   --  13.3 14.9  INR 0.94  --   --  1.02 1.19  CREATININE 1.50* 1.13* 1.10  --  1.06    Estimated Creatinine Clearance: 29.2 ml/min (by C-G formula based on Cr of 1.06).   Assessment: 79 yo F presented 3/19 with L hip pain s/p mechanical fall, Xray revealed L femoral neck fracture. Pt underwent hemiarthroplasty 3/20, Coumadin started post-op for VTE prophylaxis. Of note, pt has a h/o afib but wast not anticoagulated due to history of AVM bleed. Coumadin score = 0  INR is subtherapeutic today as expected after only one dose of warfarin 2.5mg   H/H low and trending down - management per MD  MD has already written order for Coumadin 5mg  daily at discharge. Given patient's Coumadin score and history of AVM bleed - would consider lowering the dose - pharmacy will f/u and provide recommendation at time of discharge (no mention of d/c plans yet in notes)  Goal of Therapy:  INR 2-3 Monitor platelets by anticoagulation protocol: Yes   Plan:   Repeat Coumadin 2.13m po x 1  Daily PT/INR  Warfarin education completed by previous pharmacist on 3/21  Darrol Angel, PharmD Pager: [phone] 08/20/2012 9:23 AM

## 2012-08-20 NOTE — Progress Notes (Addendum)
Patient ID: Lori [name], female   DOB: 1920-10-22, 79 y.o.   MRN: [id number] Subjective: 2 Days Post-Op Procedure(s) (LRB): ARTHROPLASTY BIPOLAR HIP (Left) Awake, alert and Ox3. Patient reports pain as mild.    Objective:   VITALS:  Temp:  [98.2 F (36.8 C)-99.1 F (37.3 C)] 99.1 F (37.3 C) (03/22 1538) Pulse Rate:  [89-122] 122 (03/22 1538) Resp:  [14-22] 22 (03/22 1538) BP: (102-119)/(50-62) 119/60 mmHg (03/22 1538) SpO2:  [93 %-100 %] 96 % (03/22 1538)  Neurologically intact ABD soft Neurovascular intact Sensation intact distally Intact pulses distally Dorsiflexion/Plantar flexion intact Incision: dressing C/D/I   LABS  Recent Labs  08/17/12 2230 08/18/12 0420 08/19/12 0424 08/20/12 0437  HGB 11.7* 11.0* 10.2* 9.4*  WBC 11.7* 12.8* 10.4 11.0*  PLT 262 234 192 176    Recent Labs  08/19/12 0424 08/20/12 0437  NA 136 134*  K 4.6 4.1  CL 103 102  CO2 29 28  BUN 18 19  CREATININE 1.10 1.06  GLUCOSE 155* 92    Recent Labs  08/19/12 0940 08/20/12 0437  INR 1.02 1.19     Assessment/Plan: 2 Days Post-Op Procedure(s) (LRB): ARTHROPLASTY BIPOLAR HIP (Left)  Advance diet Up with therapy Discharge to SNF likely Monday.   Lindzy Rupert E 08/20/2012, 3:43 PM

## 2012-08-20 NOTE — Progress Notes (Signed)
Patient finally voided and was incontinent. Bed pad was soaked and went to the bottom sheet. Bladder scan volume was . NP was notified and said to not in and out catheterize since she is now voiding on her own. RN will continue to monitor and offer the bedpan throughout the morning.

## 2012-08-20 NOTE — Progress Notes (Signed)
08/20/12-reported to [facility] on 5th floor at 23:10, Transported pt to Rm#[id number] with all of pt's belongings ; meds & pt chart at 23:38pm.

## 2012-08-20 NOTE — Progress Notes (Signed)
Patient ID: Lori [name], female   DOB: [birth year], 79 y.o.   MRN: [id number]  [facility] PROGRESS NOTE  Lori [name] WUJ:[id number] DOB: 08/09/1920 DOA: 08/17/2012 PCP: Thayer Headings, MD  Brief narrative:  Patient is 79 year old female who presents to [place] [facility] ED after an episode of fall in assisted living facility. She was brought to emergency department and found to have sustained left displaced femoral neck fracture requiring hemiarthroplasty.   Principal Problem:  Fracture of femoral neck, left  - Postop day 2, patient doing well and is clinically stable  - PT evaluation done and recommendation is for SNF vs home with 24 our supervision, PT to continue while inpatient  - Analgesia as needed for symptom relief  Active Problems:  Diabetes mellitus  - Stable with one episode of hypoglycemia, continue sliding scale insulin  Atrial fibrillation  - In sinus rhythm this morning, rate controlled, continue Coumadin per pharmacy  COPD (chronic obstructive pulmonary disease)  - Clinically stable and maintaining oxygen saturations at target range  HTN (hypertension)  - Reasonable inpatient control  Acute renal failure  - Secondary to prerenal etiology, IV fluids provided, patient responded well  - Creatinine within normal limits this morning  Acute on chronic blood loss anemia  - Component of blood loss, postop  - Continue to monitor hemoglobin and hematocrit daily   Consultants:  Ortho Procedures/Studies:  Dg Hip Complete Left 08/17/2012  Transverse fracture of the left femoral neck with varus angulation.  Dg Pelvis Portable 08/18/2012  Anatomic alignment post left hip arthroplasty without acute complicating features.  Dg Chest Port 1 View 08/17/2012  Stable cardiomegaly and pleuroparenchymal scarring at the right lung base. Stable COPD (chronic bronchitis). No acute cardiopulmonary disease.  Antibiotics:  None  Code Status: Full  Family Communication: Pt and son at  bedside  Disposition Plan: SNF likely on Monday   HPI/Subjective: No events overnight.   Objective: Filed Vitals:   08/20/12 0500 08/20/12 0622 08/20/12 0800 08/20/12 0943  BP: 102/50 104/62    Pulse: 91     Temp: 98.9 F (37.2 C)     TempSrc: Oral     Resp: 16  20   Height:      Weight:      SpO2: 100% 93% 97% 98%    Intake/Output Summary (Last 24 hours) at 08/20/12 1105 Last data filed at 08/20/12 0900  Gross per 24 hour  Intake    900 ml  Output    390 ml  Net    510 ml    Exam:   General:  Pt is alert but confused, oriented to name only, follows commands appropriately, not in acute distress  Cardiovascular: Regular rate and rhythm, S1/S2, no murmurs, no rubs, no gallops  Respiratory: Clear to auscultation bilaterally, no wheezing, no crackles, no rhonchi  Abdomen: Soft, non tender, non distended, bowel sounds present, no guarding  Extremities: No edema, pulses DP and PT palpable bilaterally  Neuro: Grossly nonfocal  Data Reviewed: Basic Metabolic Panel:  Recent Labs Lab 08/17/12 2230 08/18/12 0420 08/19/12 0424 08/20/12 0437  NA 136 136 136 134*  K 4.3 4.1 4.6 4.1  CL 101 103 103 102  CO2 28 25 29 28   GLUCOSE 159* 245* 155* 92  BUN 30* 25* 18 19  CREATININE 1.50* 1.13* 1.10 1.06  CALCIUM 9.5 9.1 8.7 8.7   Liver Function Tests:  Recent Labs Lab 08/17/12 2230 08/18/12 0420  AST 25  --   ALT 17  --  ALKPHOS 92  --   BILITOT 0.2*  --   PROT 8.0  --   ALBUMIN 3.4* 3.0*   CBC:  Recent Labs Lab 08/17/12 2230 08/18/12 0420 08/19/12 0424 08/20/12 0437  WBC 11.7* 12.8* 10.4 11.0*  NEUTROABS 9.0*  --   --   --   HGB 11.7* 11.0* 10.2* 9.4*  HCT 36.4 34.7* 31.9* 29.3*  MCV 89.2 89.0 90.4 89.6  PLT 262 234 192 176   CBG:  Recent Labs Lab 08/18/12 2056 08/19/12 0757 08/19/12 1147 08/19/12 1735 08/19/12 2110  GLUCAP 158* 125* 156* 208* 127*    Recent Results (from the past 240 hour(s))  SURGICAL PCR SCREEN     Status: None    Collection Time    08/18/12  4:01 AM      Result Value Range Status   MRSA, PCR NEGATIVE  NEGATIVE Final   Staphylococcus aureus NEGATIVE  NEGATIVE Final   Comment:            The Xpert SA Assay (FDA     approved for NASAL specimens     in patients over 39 years of age),     is one component of     a comprehensive surveillance     program.  Test performance has     been validated by [employer] for patients greater     than or equal to 36 year old.     It is not intended     to diagnose infection nor to     guide or monitor treatment.     Scheduled Meds: .  ceFAZolin (ANCEF) IV  2 g Intravenous Once  . diltiazem  60 mg Oral Q8H  . docusate sodium  100 mg Oral BID  . fluticasone  2 puff Inhalation BID  . insulin aspart  0-9 Units Subcutaneous TID WC  . pantoprazole  40 mg Oral q morning - 10a  . polyethylene glycol  17 g Oral q morning - 10a  . potassium chloride  10 mEq Oral Daily  . senna-docusate  1 tablet Oral q morning - 10a  . warfarin  2.5 mg Oral ONCE-1800  . warfarin   Does not apply Once  . Warfarin - Pharmacist Dosing Inpatient   Does not apply q1800   Continuous Infusions:    Debbora Presto, MD  [facility] Pager [phone]  If 7PM-7AM, please contact night-coverage [email or web address] Password [email or web address] 08/20/2012, 11:05 AM   LOS: 3 days

## 2012-08-21 ENCOUNTER — Inpatient Hospital Stay (HOSPITAL_COMMUNITY): Payer: Medicare Other

## 2012-08-21 DIAGNOSIS — D62 Acute posthemorrhagic anemia: Secondary | ICD-10-CM | POA: Diagnosis not present

## 2012-08-21 LAB — TYPE AND SCREEN
DAT, IgG: POSITIVE
PT AG Type: NEGATIVE
Unit division: 0
Unit division: 0
Unit division: 0

## 2012-08-21 LAB — CBC
HCT: 26 % — ABNORMAL LOW (ref 36.0–46.0)
Hemoglobin: 8.5 g/dL — ABNORMAL LOW (ref 12.0–15.0)
MCHC: 32.7 g/dL (ref 30.0–36.0)
MCV: 88.4 fL (ref 78.0–100.0)
RDW: 13.1 % (ref 11.5–15.5)

## 2012-08-21 LAB — BASIC METABOLIC PANEL
CO2: 26 mEq/L (ref 19–32)
Calcium: 8.8 mg/dL (ref 8.4–10.5)
Chloride: 101 mEq/L (ref 96–112)
Creatinine, Ser: 1.26 mg/dL — ABNORMAL HIGH (ref 0.50–1.10)
Glucose, Bld: 114 mg/dL — ABNORMAL HIGH (ref 70–99)
Sodium: 133 mEq/L — ABNORMAL LOW (ref 135–145)

## 2012-08-21 LAB — URINALYSIS, ROUTINE W REFLEX MICROSCOPIC
Glucose, UA: NEGATIVE mg/dL
Ketones, ur: NEGATIVE mg/dL
Nitrite: NEGATIVE
pH: 5 (ref 5.0–8.0)

## 2012-08-21 LAB — GLUCOSE, CAPILLARY
Glucose-Capillary: 201 mg/dL — ABNORMAL HIGH (ref 70–99)
Glucose-Capillary: 108 mg/dL — ABNORMAL HIGH (ref 70–99)
Glucose-Capillary: 169 mg/dL — ABNORMAL HIGH (ref 70–99)
Glucose-Capillary: 164 mg/dL — ABNORMAL HIGH (ref 70–99)

## 2012-08-21 LAB — PROTIME-INR: INR: 1.71 — ABNORMAL HIGH (ref 0.00–1.49)

## 2012-08-21 LAB — URINE MICROSCOPIC-ADD ON

## 2012-08-21 MED ORDER — WARFARIN SODIUM 1 MG PO TABS
1.0000 mg | ORAL_TABLET | Freq: Once | ORAL | Status: AC
  Administered 2012-08-21: 1 mg via ORAL
  Filled 2012-08-21 (×2): qty 1

## 2012-08-21 NOTE — Progress Notes (Signed)
Patient ID: Lori [name], female   DOB: Jan 23, 1921, 79 y.o.   MRN: [id number]  [facility] PROGRESS NOTE  AZADEH HYDER OZH:[id number] DOB: December 23, 1920 DOA: 08/17/2012 PCP: Thayer Headings, MD  Brief narrative:  Patient is 79 year old female who presents to [place] [facility] ED after an episode of fall in assisted living facility. She was brought to emergency department and found to have sustained left displaced femoral neck fracture requiring hemiarthroplasty.   Principal Problem:  Fracture of femoral neck, left  - Postop day 2, patient doing well and is clinically stable  - PT evaluation done and recommendation is for SNF and will likely be able to go Monday, PT to continue while inpatient  - Analgesia as needed for symptom relief  Active Problems:  Low grade fever - unclear etiology, will check UA and CXR today - tylenol as needed  Diabetes mellitus  - Stable with one episode of hypoglycemia, continue sliding scale insulin  Atrial fibrillation  - rate controlled, continue Coumadin per pharmacy, continue diltiazem  COPD (chronic obstructive pulmonary disease)  - Clinically stable and maintaining oxygen saturations at target range  HTN (hypertension)  - Reasonable inpatient control  Acute renal failure  - Secondary to prerenal etiology, IV fluids provided, patient responded well  - Creatinine slightly up from yesterday  Acute on chronic blood loss anemia  - Component of blood loss, postop  - Continue to monitor hemoglobin and hematocrit daily   Consultants:  Ortho Procedures/Studies:  Dg Hip Complete Left 08/17/2012  Transverse fracture of the left femoral neck with varus angulation.  Dg Pelvis Portable 08/18/2012  Anatomic alignment post left hip arthroplasty without acute complicating features.  Dg Chest Port 1 View 08/17/2012  Stable cardiomegaly and pleuroparenchymal scarring at the right lung base. Stable COPD (chronic bronchitis). No acute cardiopulmonary disease.   Antibiotics:  None  Code Status: Full  Family Communication: Pt and son at bedside  Disposition Plan: SNF likely on Monday  HPI/Subjective: No events overnight.   Objective: Filed Vitals:   08/20/12 2152 08/21/12 0000 08/21/12 0500 08/21/12 0827  BP: 114/42 108/65 118/44   Pulse: 101 94 90   Temp: 99.1 F (37.3 C) 99.6 F (37.6 C) 98.4 F (36.9 C)   TempSrc: Oral Oral Oral   Resp: 18 16 16    Height:      Weight:      SpO2: 96% 95% 99% 98%    Intake/Output Summary (Last 24 hours) at 08/21/12 1013 Last data filed at 08/21/12 0500  Gross per 24 hour  Intake     60 ml  Output    300 ml  Net   -240 ml    Exam:   General:  Pt is alert, follows commands appropriately, not in acute distress  Cardiovascular: Regular rate and rhythm, S1/S2, no murmurs, no rubs, no gallops  Respiratory: Clear to auscultation bilaterally, no wheezing, no crackles, no rhonchi, decreased breath sounds at bases   Abdomen: Soft, non tender, non distended, bowel sounds present, no guarding  Extremities: No edema, pulses DP and PT palpable bilaterally  Neuro: Grossly nonfocal  Data Reviewed: Basic Metabolic Panel:  Recent Labs Lab 08/17/12 2230 08/18/12 0420 08/19/12 0424 08/20/12 0437 08/21/12 0450  NA 136 136 136 134* 133*  K 4.3 4.1 4.6 4.1 4.2  CL 101 103 103 102 101  CO2 28 25 29 28 26   GLUCOSE 159* 245* 155* 92 114*  BUN 30* 25* 18 19 26*  CREATININE 1.50* 1.13* 1.10 1.06 1.26*  CALCIUM 9.5 9.1 8.7 8.7 8.8   Liver Function Tests:  Recent Labs Lab 08/17/12 2230 08/18/12 0420  AST 25  --   ALT 17  --   ALKPHOS 92  --   BILITOT 0.2*  --   PROT 8.0  --   ALBUMIN 3.4* 3.0*   CBC:  Recent Labs Lab 08/17/12 2230 08/18/12 0420 08/19/12 0424 08/20/12 0437 08/21/12 0450  WBC 11.7* 12.8* 10.4 11.0* 9.6  NEUTROABS 9.0*  --   --   --   --   HGB 11.7* 11.0* 10.2* 9.4* 8.5*  HCT 36.4 34.7* 31.9* 29.3* 26.0*  MCV 89.2 89.0 90.4 89.6 88.4  PLT 262 234 192 176 166    CBG:  Recent Labs Lab 08/20/12 0729 08/20/12 1131 08/20/12 1648 08/20/12 2149 08/21/12 0809  GLUCAP 81 171* 157* 201* 108*    Recent Results (from the past 240 hour(s))  SURGICAL PCR SCREEN     Status: None   Collection Time    08/18/12  4:01 AM      Result Value Range Status   MRSA, PCR NEGATIVE  NEGATIVE Final   Staphylococcus aureus NEGATIVE  NEGATIVE Final   Comment:            The Xpert SA Assay (FDA     approved for NASAL specimens     in patients over 37 years of age),     is one component of     a comprehensive surveillance     program.  Test performance has     been validated by [employer] for patients greater     than or equal to 41 year old.     It is not intended     to diagnose infection nor to     guide or monitor treatment.     Scheduled Meds: . diltiazem  60 mg Oral Q8H  . docusate sodium  100 mg Oral BID  . fluticasone  2 puff Inhalation BID  . insulin aspart  0-9 Units Subcutaneous TID WC  . pantoprazole  40 mg Oral q morning - 10a  . polyethylene glycol  17 g Oral q morning - 10a  . potassium chloride  10 mEq Oral Daily  . senna-docusate  1 tablet Oral q morning - 10a  . warfarin   Does not apply Once  . Warfarin - Pharmacist Dosing Inpatient   Does not apply q1800   Continuous Infusions:    Debbora Presto, MD  [facility] Pager [phone]  If 7PM-7AM, please contact night-coverage [email or web address] Password [email or web address] 08/21/2012, 10:13 AM   LOS: 4 days

## 2012-08-21 NOTE — Progress Notes (Signed)
Physical Therapy Treatment Patient Details Name: Lori [name] MRN: [id number] DOB: [birth year] Today's Date: 08/21/2012 Time: 4098-1191 PT Time Calculation (min): 13 min  PT Assessment / Plan / Recommendation Comments on Treatment Session  Pt gradually improving in mobiliyt and gait today with less assist needed. She continues to need follow up PT at SNF prior to d/c to home    Follow Up Recommendations  SNF     Does the patient have the potential to tolerate intense rehabilitation     Barriers to Discharge        Equipment Recommendations  None recommended by PT    Recommendations for Other Services    Frequency     Plan Discharge plan remains appropriate;Frequency remains appropriate    Precautions / Restrictions Precautions Precautions: Fall;Posterior Hip Precaution Comments: reviewed posterior hip precautions with pt, pillow between knees to assist Restrictions Other Position/Activity Restrictions: WBAT   Pertinent Vitals/Pain Pt with pain with mobility. Awaiting pain meds    Mobility  Bed Mobility Bed Mobility: Supine to Sit Supine to Sit: 1: +2 Total assist Supine to Sit: Patient Percentage: 40% Details for Bed Mobility Assistance: pt semisidelying on R side upon entering, assisted pt with supine position then verbal cues and assist for getting to EOB within precautions Transfers Transfers: Stand to Sit;Sit to Stand Sit to Stand: From bed;3: Mod assist;From chair/3-in-1 Stand to Sit: With armrests;To chair/3-in-1;3: Mod assist Details for Transfer Assistance: pt needs assist to lift off from chair Ambulation/Gait Ambulation/Gait Assistance: 3: Mod assist Ambulation Distance (Feet): 40 Feet (10' , 30') Assistive device: Rolling walker Ambulation/Gait Assistance Details: cues to try to extend trunk and push up on arms,  Gait Pattern: Step-to pattern;Decreased stance time - left;Antalgic;Decreased step length - right;Trunk flexed Gait velocity: decreased General  Gait Details: increased time and effort for gait, pt with some pain with weight bearing. Pt had some dyspnea on exertion Stairs: No Wheelchair Mobility Wheelchair Mobility: No    Exercises General Exercises - Lower Extremity Ankle Circles/Pumps: AROM;Both;5 reps Short Arc Quad: AAROM;Left;5 reps Hip Flexion/Marching: AROM;Right;5 reps Other Exercises Other Exercises: deep breathing Other Exercises: core activation   PT Diagnosis:    PT Problem List:   PT Treatment Interventions:     PT Goals Acute Rehab PT Goals PT Goal Formulation: With patient Time For Goal Achievement: 09/02/12 Potential to Achieve Goals: Good Pt will go Supine/Side to Sit: with supervision PT Goal: Supine/Side to Sit - Progress: Progressing toward goal Pt will go Sit to Supine/Side: with supervision PT Goal: Sit to Supine/Side - Progress: Progressing toward goal Pt will go Sit to Stand: with supervision PT Goal: Sit to Stand - Progress: Progressing toward goal Pt will go Stand to Sit: with supervision PT Goal: Stand to Sit - Progress: Progressing toward goal Pt will Ambulate: 51 - 150 feet;with supervision;with rolling walker PT Goal: Ambulate - Progress: Progressing toward goal Pt will Perform Home Exercise Program: with supervision, verbal cues required/provided PT Goal: Perform Home Exercise Program - Progress: Progressing toward goal  Visit Information  Last PT Received On: 08/21/12    Subjective Data  Subjective: "I'm 79 years old!"  Patient Stated Goal: Pt needs to get out of bed to pee.   Cognition  Cognition Overall Cognitive Status: Appears within functional limits for tasks assessed/performed Arousal/Alertness: Awake/alert Behavior During Session: [facility] for tasks performed    Balance  Balance Balance Assessed: Yes Static Sitting Balance Static Sitting - Balance Support: Bilateral upper extremity supported Static Sitting - Level  of Assistance: 5: Stand by Paediatric nurse Standing - Balance Support: Bilateral upper extremity supported Static Standing - Level of Assistance: 4: Min assist Static Standing - Comment/# of Minutes: with RW x 10 seconds for core activation a  End of Session PT - End of Session Equipment Utilized During Treatment: Gait belt Activity Tolerance: Patient limited by fatigue;Patient limited by pain Patient left: in chair;with call bell/phone within reach Nurse Communication: Patient requests pain meds   GP    Rosey Bath K. [place], [place] [phone] 08/21/2012, 11:41 AM

## 2012-08-21 NOTE — Progress Notes (Signed)
Patient ID: Lori [name], female   DOB: 1920/12/13, 79 y.o.   MRN: [id number] Subjective: 3 Days Post-Op Procedure(s) (LRB): ARTHROPLASTY BIPOLAR HIP (Left) Awake, alert and Ox4. Out of bed on commode. Patient reports pain as mild.    Objective:   VITALS:  Temp:  [98.4 F (36.9 C)-99.6 F (37.6 C)] 98.4 F (36.9 C) (03/23 0500) Pulse Rate:  [90-122] 90 (03/23 0500) Resp:  [16-22] 16 (03/23 0500) BP: (108-119)/(42-65) 118/44 mmHg (03/23 0500) SpO2:  [93 %-99 %] 98 % (03/23 0827)  Neurologically intact ABD soft Neurovascular intact Sensation intact distally Intact pulses distally Dorsiflexion/Plantar flexion intact Incision: dressing C/D/I   LABS  Recent Labs  08/19/12 0424 08/20/12 0437 08/21/12 0450  HGB 10.2* 9.4* 8.5*  WBC 10.4 11.0* 9.6  PLT 192 176 166    Recent Labs  08/20/12 0437 08/21/12 0450  NA 134* 133*  K 4.1 4.2  CL 102 101  CO2 28 26  BUN 19 26*  CREATININE 1.06 1.26*  GLUCOSE 92 114*    Recent Labs  08/20/12 0437 08/21/12 0450  INR 1.19 1.71*     Assessment/Plan: 3 Days Post-Op Procedure(s) (LRB): ARTHROPLASTY BIPOLAR HIP (Left) Anemia, post op.   Advance diet Up with therapy Discharge to SNF  NITKA,JAMES E 08/21/2012, 10:53 AM

## 2012-08-21 NOTE — Progress Notes (Signed)
Pts bp is 118/44. MD notified and ordered to hold cardizem this am. Lori [name]

## 2012-08-21 NOTE — Progress Notes (Signed)
ANTICOAGULATION CONSULT NOTE - Follow Up  Pharmacy Consult for Coumadin Indication: VTE prophylaxis s/p L hemiarthroplasty  Patient Measurements: Height: 5\' 3"  (160 cm) Weight: 117 lb 3.2 oz (53.162 kg) IBW/kg (Calculated) : 52.4  Labs:  Recent Labs  08/19/12 0424 08/19/12 0940 08/20/12 0437 08/21/12 0450  HGB 10.2*  --  9.4* 8.5*  HCT 31.9*  --  29.3* 26.0*  PLT 192  --  176 166  LABPROT  --  13.3 14.9 19.5*  INR  --  1.02 1.19 1.71*  CREATININE 1.10  --  1.06 1.26*    Estimated Creatinine Clearance: 24.5 ml/min (by C-G formula based on Cr of 1.26).   Assessment: 79 yo F presented 3/19 with L hip pain s/p mechanical fall, Xray revealed L femoral neck fracture. Pt underwent hemiarthroplasty 3/20, Coumadin started post-op for VTE prophylaxis. Of note, pt has a h/o afib but wast not anticoagulated due to history of AVM bleed. Coumadin score = 0  INR is subtherapeutic today as expected after only two doses of warfarin 2.5mg , BUT INR has jumped up significantly overnight  H/H low and trending down - management per MD  MD has already written order for Coumadin 5mg  daily at discharge. Given patient's Coumadin score and history of AVM bleed - would consider lowering the dose - pharmacy will f/u and provide recommendation at time of discharge  Goal of Therapy:  INR 2-3 Monitor platelets by anticoagulation protocol: Yes   Plan:   Warfarin Coumadin 1mg  po x 1  Daily PT/INR  Warfarin education completed by previous pharmacist on 3/21  Suggest NOT sending patient home on warfarin 5mg  daily - please call Pharmacy prior to discharge for dosing recommendations ([phone])  Darrol Angel, PharmD 08/21/2012 3:55 PM

## 2012-08-22 ENCOUNTER — Encounter (HOSPITAL_COMMUNITY): Payer: Self-pay | Admitting: Orthopaedic Surgery

## 2012-08-22 LAB — VITAMIN D 1,25 DIHYDROXY
Vitamin D 1, 25 (OH)2 Total: 35 pg/mL (ref 18–72)
Vitamin D2 1, 25 (OH)2: <8 pg/mL
Vitamin D3 1, 25 (OH)2: 35 pg/mL

## 2012-08-22 LAB — CBC
HCT: 27.1 % — ABNORMAL LOW (ref 36.0–46.0)
MCH: 28.7 pg (ref 26.0–34.0)
MCV: 88.3 fL (ref 78.0–100.0)
RBC: 3.07 MIL/uL — ABNORMAL LOW (ref 3.87–5.11)
RDW: 13.1 % (ref 11.5–15.5)
WBC: 9.5 10*3/uL (ref 4.0–10.5)

## 2012-08-22 LAB — BASIC METABOLIC PANEL
BUN: 28 mg/dL — ABNORMAL HIGH (ref 6–23)
CO2: 25 mEq/L (ref 19–32)
Calcium: 8.7 mg/dL (ref 8.4–10.5)
Chloride: 100 mEq/L (ref 96–112)
Creatinine, Ser: 1.19 mg/dL — ABNORMAL HIGH (ref 0.50–1.10)

## 2012-08-22 LAB — GLUCOSE, CAPILLARY: Glucose-Capillary: 147 mg/dL — ABNORMAL HIGH (ref 70–99)

## 2012-08-22 MED ORDER — WARFARIN SODIUM 5 MG PO TABS: 5.0000 mg | ORAL_TABLET | Freq: Every day | ORAL | Status: DC

## 2012-08-22 MED ORDER — WARFARIN SODIUM 1 MG PO TABS
1.0000 mg | ORAL_TABLET | Freq: Once | ORAL | Status: DC
  Filled 2012-08-22: qty 1

## 2012-08-22 MED ORDER — HYDROCODONE-ACETAMINOPHEN 5-325 MG PO TABS: 1.0000 | ORAL_TABLET | ORAL | Status: DC | PRN

## 2012-08-22 MED ORDER — ZOLPIDEM TARTRATE 5 MG PO TABS: 5.0000 mg | ORAL_TABLET | Freq: Every evening | ORAL | Status: DC | PRN

## 2012-08-22 MED ORDER — GUAIFENESIN ER 600 MG PO TB12: 600.0000 mg | ORAL_TABLET | Freq: Two times a day (BID) | ORAL | Status: AC

## 2012-08-22 NOTE — Progress Notes (Signed)
ANTICOAGULATION CONSULT NOTE - Follow Up  Pharmacy Consult for Coumadin Indication: VTE prophylaxis s/p L hemiarthroplasty  Patient Measurements: Height: 5\' 3"  (160 cm) Weight: 117 lb 3.2 oz (53.162 kg) IBW/kg (Calculated) : 52.4  Labs:  Recent Labs  08/20/12 0437 08/21/12 0450 08/22/12 0421  HGB 9.4* 8.5* 8.8*  HCT 29.3* 26.0* 27.1*  PLT 176 166 194  LABPROT 14.9 19.5* 22.8*  INR 1.19 1.71* 2.11*  CREATININE 1.06 1.26* 1.19*    Estimated Creatinine Clearance: 26 ml/min (by C-G formula based on Cr of 1.19).  Inpatient Warfarin Doses: 3/21: 2.5mg , 3/22: 2.5mg , 3/23: 1mg   Assessment: 79 yo F presented 3/19 with L hip pain s/p mechanical fall, Xray revealed L femoral neck fracture. Pt underwent hemiarthroplasty 3/20, Coumadin started post-op for VTE prophylaxis. Of note, pt has a h/o afib but wast not anticoagulated due to history of AVM bleed. Coumadin score = 0  INR therapeutic 2.11 on low doses of warfarin  H/H low but improved from yesterday, no bleeding/complications reported.  MD has already written order for Coumadin 5mg  daily at discharge. Given patient's Coumadin score and history of AVM bleed - would consider lowering the dose - pharmacy will f/u and provide recommendation at time of discharge  Goal of Therapy:  INR 2-3 Monitor platelets by anticoagulation protocol: Yes   Plan:   Warfarin Coumadin 1mg  po x 1  Daily PT/INR  Warfarin education completed by previous pharmacist on 3/21  Suggest NOT sending patient home on warfarin 5mg  daily - please call Pharmacy prior to discharge for dosing recommendations ([phone])  Loralee Pacas, PharmD, BCPS Pager: [phone]  08/22/2012 9:02 AM

## 2012-08-22 NOTE — Discharge Summary (Signed)
Physician Discharge Summary  Lori [name] AVW:[id number] DOB: 02/18/1921 DOA: 08/17/2012  PCP: Thayer Headings, MD  Admit date: 08/17/2012 Discharge date: 08/22/2012  Recommendations for Outpatient Follow-up:  1. Pt will need to follow up with PCP in 2-3 weeks post discharge 2. Please obtain BMP to evaluate electrolytes and kidney function 3. Please also check CBC to evaluate Hg and Hct levels 4. Please check Pt/INR in 1-2 days to adjust the Coumadin dose   Discharge Diagnoses:   Fracture of femoral neck, left Principal Problem:   Fracture of femoral neck, left Active Problems:   Diabetes mellitus   Atrial fibrillation   COPD (chronic obstructive pulmonary disease)   HTN (hypertension)   Postoperative anemia due to acute blood loss  Discharge Condition: Stable  Diet recommendation: Heart healthy diet discussed in details   Brief narrative:  Patient is 79 year old female who presents to [facility] ED after an episode of fall in assisted living facility. She was brought to emergency department and found to have sustained left displaced femoral neck fracture requiring hemiarthroplasty.   Principal Problem:  Fracture of femoral neck, left  - Postop day 3, patient doing well and is clinically stable  - PT evaluation done and recommendation is for SNF, family agrees  - Analgesia as needed for symptom relief  Active Problems:  Low grade fever  - unclear etiology, UA and CXR unremarkable for acute events - pt afebrile over 24 hours  - tylenol as needed  Diabetes mellitus  - Stable inpatient control  Atrial fibrillation  - rate controlled, continue Coumadin upon discharge, continue diltiazem  - pt will need PT/INR checked in 1-2 days in order to adjust the dose of Coumadin  COPD (chronic obstructive pulmonary disease)  - Clinically stable and maintaining oxygen saturations at target range  HTN (hypertension)  - Reasonable inpatient control  Acute renal failure  - Secondary to  prerenal etiology, IV fluids provided, patient responded well  - Creatinine trending down  Acute on chronic blood loss anemia  - Component of blood loss, postop  - Hg and Hct remain stable over 24 hour period   Consultants:  Ortho Procedures/Studies:  Dg Hip Complete Left 08/17/2012  Transverse fracture of the left femoral neck with varus angulation.  Dg Pelvis Portable 08/18/2012  Anatomic alignment post left hip arthroplasty without acute complicating features.  Dg Chest Port 1 View 08/17/2012  Stable cardiomegaly and pleuroparenchymal scarring at the right lung base. Stable COPD (chronic bronchitis). No acute cardiopulmonary disease.  Antibiotics:  None  Code Status: Full  Family Communication: Pt and son at bedside   Discharge Exam: Filed Vitals:   08/22/12 0525  BP: 125/58  Pulse:   Temp:   Resp:    Filed Vitals:   08/21/12 2300 08/21/12 2336 08/22/12 0515 08/22/12 0525  BP: 120/30 104/32 125/58 125/58  Pulse: 84 70 110   Temp: 99.2 F (37.3 C)  98.8 F (37.1 C)   TempSrc: Oral  Axillary   Resp: 15  16   Height:      Weight:      SpO2: 97%  96%     General: Pt is alert, follows commands appropriately, not in acute distress Cardiovascular: Regular rate and rhythm, S1/S2 +, no murmurs, no rubs, no gallops Respiratory: Clear to auscultation bilaterally, no wheezing, no crackles, no rhonchi Abdominal: Soft, non tender, non distended, bowel sounds +, no guarding Extremities: no edema, no cyanosis, pulses palpable bilaterally DP and PT Neuro: Grossly nonfocal  Discharge Instructions  Discharge Orders   Future Orders Complete By Expires     Diet - low sodium heart healthy  As directed     Full weight bearing  As directed     Increase activity slowly  As directed     Posterior total hip precautions  As directed         Medication List    TAKE these medications       Budesonide 90 MCG/ACT inhaler  Inhale 1 puff into the lungs 2 (two) times daily.      budesonide 180 MCG/ACT inhaler  Commonly known as:  PULMICORT  Inhale 1 puff into the lungs 2 (two) times daily.     calcium carbonate 600 MG Tabs  Commonly known as:  OS-CAL  Take 600 mg by mouth 2 (two) times daily with a meal.     diltiazem 60 MG tablet  Commonly known as:  CARDIZEM  Take 60 mg by mouth every 8 (eight) hours.     furosemide 20 MG tablet  Commonly known as:  LASIX  Take 20 mg by mouth every morning.     glipiZIDE 5 MG tablet  Commonly known as:  GLUCOTROL  Take 10 mg by mouth 2 (two) times daily before a meal.     glyBURIDE 5 MG tablet  Commonly known as:  DIABETA  Take 10 mg by mouth 2 (two) times daily with a meal.     guaiFENesin 600 MG 12 hr tablet  Commonly known as:  MUCINEX  Take 1 tablet (600 mg total) by mouth 2 (two) times daily.     HYDROcodone-acetaminophen 5-325 MG per tablet  Commonly known as:  NORCO  Take 1 tablet by mouth every 4 (four) hours as needed for pain.     pantoprazole 40 MG tablet  Commonly known as:  PROTONIX  Take 40 mg by mouth every morning.     polyethylene glycol powder powder  Commonly known as:  GLYCOLAX/MIRALAX  Take 17 g by mouth every morning.     potassium chloride 10 MEQ tablet  Commonly known as:  K-DUR  Take 1 tablet (10 mEq total) by mouth daily.     potassium chloride 10 MEQ tablet  Commonly known as:  K-DUR  Take 10 mEq by mouth every morning.     sennosides-docusate sodium 8.6-50 MG tablet  Commonly known as:  SENOKOT-S  Take 1 tablet by mouth every morning.     therapeutic multivitamin-minerals tablet  Take 1 tablet by mouth every morning.     traMADol 50 MG tablet  Commonly known as:  ULTRAM  Take 50 mg by mouth every 8 (eight) hours as needed for pain. pain     Vitamin D-3 5000 UNITS Tabs  Take 1 tablet by mouth every morning.     warfarin 5 MG tablet  Commonly known as:  COUMADIN  Take 1 tablet (5 mg total) by mouth daily. Dosing per pharmacy protocol for VTE prophylaxis for 4 week  duration     zolpidem 5 MG tablet  Commonly known as:  AMBIEN  Take 1-2 tablets (5-10 mg total) by mouth at bedtime as needed for sleep.           Follow-up Information   Follow up with YATES,MARK C, MD In 2 weeks.   Contact information:   [address] [place] [address] [phone]       Follow up with Thayer Headings, MD In 2 weeks.   Contact information:  [address] [place] [address] [phone]        The results of significant diagnostics from this hospitalization (including imaging, microbiology, ancillary and laboratory) are listed below for reference.     Microbiology: Recent Results (from the past 240 hour(s))  SURGICAL PCR SCREEN     Status: None   Collection Time    08/18/12  4:01 AM      Result Value Range Status   MRSA, PCR NEGATIVE  NEGATIVE Final   Staphylococcus aureus NEGATIVE  NEGATIVE Final   Comment:            The Xpert SA Assay (FDA     approved for NASAL specimens     in patients over 80 years of age),     is one component of     a comprehensive surveillance     program.  Test performance has     been validated by [employer] for patients greater     than or equal to 24 year old.     It is not intended     to diagnose infection nor to     guide or monitor treatment.     Labs: Basic Metabolic Panel:  Recent Labs Lab 08/18/12 0420 08/19/12 0424 08/20/12 0437 08/21/12 0450 08/22/12 0421  NA 136 136 134* 133* 132*  K 4.1 4.6 4.1 4.2 4.5  CL 103 103 102 101 100  CO2 25 29 28 26 25   GLUCOSE 245* 155* 92 114* 113*  BUN 25* 18 19 26* 28*  CREATININE 1.13* 1.10 1.06 1.26* 1.19*  CALCIUM 9.1 8.7 8.7 8.8 8.7   Liver Function Tests:  Recent Labs Lab 08/17/12 2230 08/18/12 0420  AST 25  --   ALT 17  --   ALKPHOS 92  --   BILITOT 0.2*  --   PROT 8.0  --   ALBUMIN 3.4* 3.0*  CBC:  Recent Labs Lab 08/17/12 2230 08/18/12 0420 08/19/12 0424 08/20/12 0437 08/21/12 0450  08/22/12 0421  WBC 11.7* 12.8* 10.4 11.0* 9.6 9.5  NEUTROABS 9.0*  --   --   --   --   --   HGB 11.7* 11.0* 10.2* 9.4* 8.5* 8.8*  HCT 36.4 34.7* 31.9* 29.3* 26.0* 27.1*  MCV 89.2 89.0 90.4 89.6 88.4 88.3  PLT 262 234 192 176 166 194   CBG:  Recent Labs Lab 08/21/12 1213 08/21/12 1635 08/21/12 1818 08/21/12 2254 08/22/12 0727  GLUCAP 169* 164* 182* 120* 132*     SIGNED: Time coordinating discharge: Over 30 minutes  Debbora Presto, MD  [facility] 08/22/2012, 10:12 AM Pager [phone]  If 7PM-7AM, please contact night-coverage [email or web address] Password [email or web address]

## 2012-08-22 NOTE — Progress Notes (Signed)
Pt d/c [facility] via [facility] today. Pt and family were in agreement with d/c plan.  Cori Razor  LCSW [phone]

## 2012-08-23 ENCOUNTER — Non-Acute Institutional Stay (SKILLED_NURSING_FACILITY): Payer: Medicare Other | Admitting: Internal Medicine

## 2012-08-23 DIAGNOSIS — S7290XD Unspecified fracture of unspecified femur, subsequent encounter for closed fracture with routine healing: Secondary | ICD-10-CM

## 2012-08-23 DIAGNOSIS — E1149 Type 2 diabetes mellitus with other diabetic neurological complication: Secondary | ICD-10-CM

## 2012-08-23 DIAGNOSIS — J449 Chronic obstructive pulmonary disease, unspecified: Secondary | ICD-10-CM

## 2012-08-23 DIAGNOSIS — S7292XE Unspecified fracture of left femur, subsequent encounter for open fracture type I or II with routine healing: Secondary | ICD-10-CM

## 2012-08-23 DIAGNOSIS — I4891 Unspecified atrial fibrillation: Secondary | ICD-10-CM

## 2012-09-01 ENCOUNTER — Non-Acute Institutional Stay (SKILLED_NURSING_FACILITY): Payer: Medicare Other | Admitting: Adult Health

## 2012-09-01 ENCOUNTER — Encounter: Payer: Self-pay | Admitting: Adult Health

## 2012-09-01 DIAGNOSIS — Z7901 Long term (current) use of anticoagulants: Secondary | ICD-10-CM

## 2012-09-01 DIAGNOSIS — S72002A Fracture of unspecified part of neck of left femur, initial encounter for closed fracture: Secondary | ICD-10-CM

## 2012-09-01 DIAGNOSIS — I4891 Unspecified atrial fibrillation: Secondary | ICD-10-CM

## 2012-09-01 DIAGNOSIS — S72009A Fracture of unspecified part of neck of unspecified femur, initial encounter for closed fracture: Secondary | ICD-10-CM

## 2012-09-01 MED ORDER — WARFARIN SODIUM 2 MG PO TABS: 2.0000 mg | ORAL_TABLET | Freq: Every day | ORAL | Status: DC

## 2012-09-01 NOTE — Progress Notes (Signed)
Patient ID: GENIVA LOHNES, female   DOB: [birth year], 79 y.o.   MRN: [id number] Subjective:     Indication: DVT and A-Fib Bleeding signs/symptoms: None Thromboembolic signs/symptoms: None  Missed Coumadin doses:  Held x 2 doses due to supratherapeutic INR Medication changes: no Dietary changes: no Bacterial/viral infection: no Other concerns: no  The following portions of the patient's history were reviewed and updated as appropriate: allergies, current medications, past family history, past medical history, past social history, past surgical history and problem list.  Review of Systems A comprehensive review of systems was negative.   Objective:    INR Today: 3.1 Current dose:  on hold  Assessment:    Therapeutic INR for goal of 2-3.5   Plan:    1. New dose: start Coumadin 2 mg PO Q D   2. Next INR:  09/06/12

## 2012-09-06 NOTE — Progress Notes (Signed)
Patient ID: Lori [name], female   DOB: 02-03-1921, 79 y.o.   MRN: [id number]        HISTORY & PHYSICAL  DATE:   08/23/2012  FACILITY:  [facility]   LEVEL OF CARE: SNF   ALLERGIES:  Allergies  Allergen Reactions  . Aspirin Other (See Comments)    Unknown   . Indapamide Other (See Comments)    Unknown   . Metformin And Related Other (See Comments)    Unknown   . Penicillins Other (See Comments)    Unknown   . Sulfa Antibiotics Other (See Comments)    Unknown   . Tiotropium Bromide Monohydrate Other (See Comments)    Unknown      CHIEF COMPLAINT:  Manage left femur fracture, COPD and diabetes mellitus.  HISTORY OF PRESENT ILLNESS:   HIP FRACTURE:  The patient is a 79 year-old, Caucasian female who fell and sustained a left displaced femoral neck fracture and subsequently underwent a hemiarthroplasty.  Patient is admitted to this facility for short-term rehabilitation. Patient denies hip pain currently. No complications reported from the pain medications currently being used.   COPD: the COPD remains stable.  Pt denies sob, cough, wheezing or declining exercise tolerance.  No complications from the medications presently being used.   DM:pt's DM remains stable.  Pt denies polyuria, polydipsia, polyphagia, changes in vision or hypoglycemic episodes.  No complications noted from the medication presently being used.  Last hemoglobin A1c is:   PAST MEDICAL HISTORY :  Past Medical History  Diagnosis Date  . Diabetes mellitus   . Hypertension   . Atrial fibrillation   . Hyperlipidemia   . Peripheral neuropathy   . Gastric AVM   . GERD (gastroesophageal reflux disease)   . Diabetic Charcot foot   . Shortness of breath      PAST SURGICAL HISTORY: Past Surgical History  Procedure Laterality Date  . Tonsillectomy    . Lung removal, partial    . Thyroidectomy, partial    . Hip arthroplasty Left 08/18/2012    Procedure: ARTHROPLASTY BIPOLAR HIP;  Surgeon: Eldred Manges, MD;  Location: [facility];  Service: Orthopedics;  Laterality: Left;     SOCIAL HISTORY:  reports that she quit smoking about 10 years ago. Her smoking use included Cigarettes. She has a 5 pack-year smoking history. She does not have any smokeless tobacco history on file. She reports that she does not drink alcohol or use illicit drugs.   FAMILY HISTORY:  Family History  Problem Relation Age of Onset  . Cancer Mother 45    melanoma  . Aneurysm Father     stomach aneurysm     CURRENT MEDICATIONS: Reviewed per [facility]  REVIEW OF SYSTEMS:  See HPI otherwise 14 point ROS is negative.   PHYSICAL EXAMINATION  VS:  T 99.1       P 106      RR 18      BP 124/63       POX% 97 room air        WT (Lb)  GENERAL: no acute distress, normal body habitus SKIN: warm & dry, no suspicious lesions or rashes, no excessive dryness EYES: conjunctivae normal, sclerae normal, normal eye lids MOUTH/THROAT: lips without lesions,no lesions in the mouth,tongue is without lesions,uvula elevates in midline NECK: supple, trachea midline, no neck masses, no thyroid tenderness, no thyromegaly LYMPHATICS: no LAN in the neck, no supraclavicular LAN RESPIRATORY: breathing is even & unlabored, BS CTAB  CARDIAC: RRR, no murmur,no extra heart sounds, no edema GI:  ABDOMEN: abdomen soft, normal BS, no masses, no tenderness  LIVER/SPLEEN: no hepatomegaly, no splenomegaly MUSCULOSKELETAL: HEAD: normal to inspection & palpation BACK: no kyphosis, scoliosis or spinal processes tenderness EXTREMITIES: LEFT UPPER EXTREMITY: full range of motion, normal strength & tone RIGHT UPPER EXTREMITY:  full range of motion, normal strength & tone LEFT LOWER EXTREMITY:  Strength intact.  Range of motion not tested due to surgery.  RIGHT LOWER EXTREMITY:  Strength intact, range of motion moderate PSYCHIATRIC: the patient is alert & oriented to person, affect & behavior appropriate  LABS/RADIOLOGY: Glucose 113, BUN 28, creatinine  1.19, otherwise BMP normal.   Albumin 3.4, otherwise liver profile normal.   Hemoglobin 8.8, MCV 88.3, otherwise CBC normal.  MRSA by PCR negative.    Staph aureus by PCR negative.    ASSESSMENT/PLAN:  Left femoral displaced hip fracture.  Status post hemiarthroplasty.  Continue rehabilitation.   COPD.  Stable.   Diabetes mellitus.  Continue current medications.  Monitor CBGs.  Atrial fibrillation.  Rate controlled.   Hypertension.  Well controlled.    Acute renal failure.  Reassess.   Acute blood loss anemia.  Reassess hemoglobin level.   V58.69.  Check CBC and BMP.   I have reviewed patient's medical records received at admission/from hospitalization.  CPT CODE: [address].

## 2012-09-07 ENCOUNTER — Non-Acute Institutional Stay (SKILLED_NURSING_FACILITY): Payer: Medicare Other | Admitting: Adult Health

## 2012-09-07 DIAGNOSIS — S72002S Fracture of unspecified part of neck of left femur, sequela: Secondary | ICD-10-CM

## 2012-09-07 DIAGNOSIS — I4891 Unspecified atrial fibrillation: Secondary | ICD-10-CM

## 2012-09-07 DIAGNOSIS — Z7901 Long term (current) use of anticoagulants: Secondary | ICD-10-CM

## 2012-09-07 DIAGNOSIS — S72009S Fracture of unspecified part of neck of unspecified femur, sequela: Secondary | ICD-10-CM

## 2012-09-08 ENCOUNTER — Non-Acute Institutional Stay (SKILLED_NURSING_FACILITY): Payer: Medicare Other | Admitting: Adult Health

## 2012-09-08 DIAGNOSIS — I4891 Unspecified atrial fibrillation: Secondary | ICD-10-CM

## 2012-09-08 DIAGNOSIS — Z7901 Long term (current) use of anticoagulants: Secondary | ICD-10-CM

## 2012-09-08 DIAGNOSIS — S72009S Fracture of unspecified part of neck of unspecified femur, sequela: Secondary | ICD-10-CM

## 2012-09-08 DIAGNOSIS — S72002S Fracture of unspecified part of neck of left femur, sequela: Secondary | ICD-10-CM

## 2012-09-13 ENCOUNTER — Non-Acute Institutional Stay (SKILLED_NURSING_FACILITY): Payer: Medicare Other | Admitting: Adult Health

## 2012-09-13 DIAGNOSIS — I4891 Unspecified atrial fibrillation: Secondary | ICD-10-CM

## 2012-09-13 DIAGNOSIS — S72002S Fracture of unspecified part of neck of left femur, sequela: Secondary | ICD-10-CM

## 2012-09-13 DIAGNOSIS — Z7901 Long term (current) use of anticoagulants: Secondary | ICD-10-CM

## 2012-09-13 DIAGNOSIS — S72009S Fracture of unspecified part of neck of unspecified femur, sequela: Secondary | ICD-10-CM

## 2012-09-15 ENCOUNTER — Non-Acute Institutional Stay (SKILLED_NURSING_FACILITY): Payer: Medicare Other | Admitting: Adult Health

## 2012-09-15 DIAGNOSIS — E119 Type 2 diabetes mellitus without complications: Secondary | ICD-10-CM

## 2012-09-15 DIAGNOSIS — K219 Gastro-esophageal reflux disease without esophagitis: Secondary | ICD-10-CM

## 2012-09-15 DIAGNOSIS — K5900 Constipation, unspecified: Secondary | ICD-10-CM

## 2012-09-15 DIAGNOSIS — S72002S Fracture of unspecified part of neck of left femur, sequela: Secondary | ICD-10-CM

## 2012-09-15 DIAGNOSIS — I10 Essential (primary) hypertension: Secondary | ICD-10-CM

## 2012-09-15 DIAGNOSIS — J449 Chronic obstructive pulmonary disease, unspecified: Secondary | ICD-10-CM

## 2012-09-15 DIAGNOSIS — S72009S Fracture of unspecified part of neck of unspecified femur, sequela: Secondary | ICD-10-CM

## 2012-09-19 ENCOUNTER — Encounter: Payer: Self-pay | Admitting: *Deleted

## 2012-09-20 ENCOUNTER — Encounter: Payer: Self-pay | Admitting: Adult Health

## 2012-09-20 DIAGNOSIS — K219 Gastro-esophageal reflux disease without esophagitis: Secondary | ICD-10-CM | POA: Insufficient documentation

## 2012-09-20 DIAGNOSIS — Z7901 Long term (current) use of anticoagulants: Secondary | ICD-10-CM | POA: Insufficient documentation

## 2012-09-20 DIAGNOSIS — K5900 Constipation, unspecified: Secondary | ICD-10-CM | POA: Insufficient documentation

## 2012-09-20 NOTE — Progress Notes (Signed)
Patient ID: Lori [name], female   DOB: 07/29/1920, 79 y.o.   MRN: [id number]  Subjective:     Indication: atrial fibrillation and DVT prophylaxis Bleeding signs/symptoms: None Thromboembolic signs/symptoms: None  Missed Coumadin doses: none Medication changes: no Dietary changes: no Bacterial/viral infection: no Other concerns: no  The following portions of the patient's history were reviewed and updated as appropriate: allergies, current medications, past family history, past medical history, past social history, past surgical history and problem list.  Review of Systems A comprehensive review of systems was negative.   Objective:    INR Today: 2.9 Current dose: 1 mg  Assessment:    Therapeutic INR for goal of 2-3   Plan:    1. New dose: Coumadin 1 mg PO Q D   2. Next INR: 09/16/12

## 2012-09-20 NOTE — Progress Notes (Signed)
  Subjective:    Patient ID: Lori [name], female    DOB: 04/29/1921, 79 y.o.   MRN: [id number]  HPI This is a 79 year old female who is for discharge to [facility]. She has been admitted to [facility] on 08/22/12 from [facility] with Left Femoral neck fracture S/P hemiarthroplasty. She has been admitted for a short-term rehabilitation.   Review of Systems  Constitutional: Negative.   HENT: Negative.   Eyes: Negative.   Respiratory: Negative.   Cardiovascular: Negative for chest pain.  Gastrointestinal: Negative.   Endocrine: Negative.   Genitourinary: Negative.   Neurological: Negative.   Hematological: Negative for adenopathy. Does not bruise/bleed easily.  Psychiatric/Behavioral: Negative.        Objective:   Physical Exam  Nursing note and vitals reviewed. Constitutional: She appears well-developed.  HENT:  Head: Normocephalic and atraumatic.  Right Ear: External ear normal.  Left Ear: External ear normal.  Eyes: Conjunctivae are normal. Pupils are equal, round, and reactive to light.  Neck: Normal range of motion. Neck supple. No thyromegaly present.  Cardiovascular: Normal rate.   Irregular HR  Pulmonary/Chest: Effort normal and breath sounds normal.  Abdominal: Soft. Bowel sounds are normal.  Musculoskeletal: She exhibits no edema and no tenderness.  Neurological: She is alert.  Skin: Skin is warm and dry.  Psychiatric: She has a normal mood and affect. Her behavior is normal.          Assessment & Plan:  Atrial fibrillation with RVR - well-controlled  COPD - stable  Diabetes mellitus - stable  Fracture of femoral neck, left - S/P hemiarthroplasty - had a short-term rehabilitation and now ready to be discharged   Atrial fibrillation  HTN (hypertension)  - well-controlled  Unspecified constipation - no complaints  GERD (gastroesophageal reflux disease)

## 2012-09-20 NOTE — Progress Notes (Signed)
Patient ID: Lori [name], female   DOB: 07/18/1920, 79 y.o.   MRN: [id number] Subjective:     Indication: atrial fibrillation and cardiomyopathy Bleeding signs/symptoms: None Thromboembolic signs/symptoms: None  Missed Coumadin doses: None Medication changes: no Dietary changes: no Bacterial/viral infection: no Other concerns: no  The following portions of the patient's history were reviewed and updated as appropriate: allergies, current medications, past family history, past medical history, past social history, past surgical history and problem list.  Review of Systems A comprehensive review of systems was negative.   Objective:    INR Today: 6.0 Current dose: 2 mg     Assessment:    Supratherapeutic INR for goal of 2-3   Plan:    1. New dose: hold coumadin x 2 days and give Vitamin K 2.5 mg PO x 1   2. Next INR: 09/08/12

## 2012-09-20 NOTE — Progress Notes (Signed)
Patient ID: Lori [name], female   DOB: 1920/09/09, 79 y.o.   MRN: [id number] Subjective:     Indication: atrial fibrillation and DVT prophylaxis Bleeding signs/symptoms: None Thromboembolic signs/symptoms: None  Missed Coumadin doses: held x 2 days Medication changes: no Dietary changes: no Bacterial/viral infection: no Other concerns: no  The following portions of the patient's history were reviewed and updated as appropriate: allergies, current medications, past family history, past medical history, past social history, past surgical history and problem list.  Review of Systems A comprehensive review of systems was negative.   Objective:    INR Today: 1.7 Current dose: held x 2 days     Assessment:    Supratherapeutic INR for goal of 2-3   Plan:    1. New dose: Coumadin 1 mg PO Q D   2. Next INR: 09/13/12

## 2012-10-14 ENCOUNTER — Other Ambulatory Visit: Payer: Self-pay | Admitting: *Deleted

## 2012-10-14 MED ORDER — HYDROCODONE-ACETAMINOPHEN 5-325 MG PO TABS: ORAL_TABLET | ORAL | Status: DC

## 2012-11-14 ENCOUNTER — Emergency Department (HOSPITAL_COMMUNITY): Payer: Medicare Other

## 2012-11-14 ENCOUNTER — Encounter (HOSPITAL_COMMUNITY): Payer: Self-pay | Admitting: *Deleted

## 2012-11-14 ENCOUNTER — Emergency Department (HOSPITAL_COMMUNITY)
Admission: EM | Admit: 2012-11-14 | Discharge: 2012-11-14 | Disposition: A | Discharge status: Skilled Nursing Facility | Payer: Medicare Other | Attending: Emergency Medicine | Admitting: Emergency Medicine

## 2012-11-14 DIAGNOSIS — I10 Essential (primary) hypertension: Secondary | ICD-10-CM | POA: Insufficient documentation

## 2012-11-14 DIAGNOSIS — Y9301 Activity, walking, marching and hiking: Secondary | ICD-10-CM | POA: Insufficient documentation

## 2012-11-14 DIAGNOSIS — W010XXA Fall on same level from slipping, tripping and stumbling without subsequent striking against object, initial encounter: Secondary | ICD-10-CM | POA: Insufficient documentation

## 2012-11-14 DIAGNOSIS — K219 Gastro-esophageal reflux disease without esophagitis: Secondary | ICD-10-CM | POA: Insufficient documentation

## 2012-11-14 DIAGNOSIS — S42201A Unspecified fracture of upper end of right humerus, initial encounter for closed fracture: Secondary | ICD-10-CM

## 2012-11-14 DIAGNOSIS — S0990XA Unspecified injury of head, initial encounter: Secondary | ICD-10-CM | POA: Insufficient documentation

## 2012-11-14 DIAGNOSIS — S42209A Unspecified fracture of upper end of unspecified humerus, initial encounter for closed fracture: Secondary | ICD-10-CM | POA: Insufficient documentation

## 2012-11-14 DIAGNOSIS — Y9229 Other specified public building as the place of occurrence of the external cause: Secondary | ICD-10-CM | POA: Insufficient documentation

## 2012-11-14 DIAGNOSIS — E785 Hyperlipidemia, unspecified: Secondary | ICD-10-CM | POA: Insufficient documentation

## 2012-11-14 DIAGNOSIS — Q2733 Arteriovenous malformation of digestive system vessel: Secondary | ICD-10-CM | POA: Insufficient documentation

## 2012-11-14 DIAGNOSIS — Z96649 Presence of unspecified artificial hip joint: Secondary | ICD-10-CM | POA: Insufficient documentation

## 2012-11-14 DIAGNOSIS — Z8679 Personal history of other diseases of the circulatory system: Secondary | ICD-10-CM | POA: Insufficient documentation

## 2012-11-14 DIAGNOSIS — G609 Hereditary and idiopathic neuropathy, unspecified: Secondary | ICD-10-CM | POA: Insufficient documentation

## 2012-11-14 DIAGNOSIS — Z7901 Long term (current) use of anticoagulants: Secondary | ICD-10-CM | POA: Insufficient documentation

## 2012-11-14 DIAGNOSIS — E1149 Type 2 diabetes mellitus with other diabetic neurological complication: Secondary | ICD-10-CM | POA: Insufficient documentation

## 2012-11-14 DIAGNOSIS — Z79899 Other long term (current) drug therapy: Secondary | ICD-10-CM | POA: Insufficient documentation

## 2012-11-14 DIAGNOSIS — Z87891 Personal history of nicotine dependence: Secondary | ICD-10-CM | POA: Insufficient documentation

## 2012-11-14 DIAGNOSIS — IMO0002 Reserved for concepts with insufficient information to code with codable children: Secondary | ICD-10-CM | POA: Insufficient documentation

## 2012-11-14 DIAGNOSIS — I499 Cardiac arrhythmia, unspecified: Secondary | ICD-10-CM | POA: Insufficient documentation

## 2012-11-14 LAB — CBC WITH DIFFERENTIAL/PLATELET
Basophils Relative: 0 % (ref 0–1)
Eosinophils Absolute: 0 10*3/uL (ref 0.0–0.7)
Hemoglobin: 11.7 g/dL — ABNORMAL LOW (ref 12.0–15.0)
MCH: 28.4 pg (ref 26.0–34.0)
MCHC: 32.5 g/dL (ref 30.0–36.0)
Monocytes Absolute: 0.5 10*3/uL (ref 0.1–1.0)
Monocytes Relative: 5 % (ref 3–12)
Neutrophils Relative %: 83 % — ABNORMAL HIGH (ref 43–77)
RDW: 13.4 % (ref 11.5–15.5)

## 2012-11-14 LAB — BASIC METABOLIC PANEL
BUN: 31 mg/dL — ABNORMAL HIGH (ref 6–23)
Creatinine, Ser: 1.34 mg/dL — ABNORMAL HIGH (ref 0.50–1.10)
GFR calc Af Amer: 39 mL/min — ABNORMAL LOW (ref >90)
GFR calc non Af Amer: 34 mL/min — ABNORMAL LOW (ref >90)
Potassium: 4.4 mEq/L (ref 3.5–5.1)

## 2012-11-14 MED ORDER — HYDROCODONE-ACETAMINOPHEN 5-325 MG PO TABS: 1.0000 | ORAL_TABLET | Freq: Four times a day (QID) | ORAL | Status: DC | PRN

## 2012-11-14 MED ORDER — TRAMADOL HCL 50 MG PO TABS: 50.0000 mg | ORAL_TABLET | Freq: Three times a day (TID) | ORAL | Status: DC | PRN

## 2012-11-14 NOTE — ED Notes (Signed)
Ortho tech contacted to apply sling as ordered.

## 2012-11-14 NOTE — ED Provider Notes (Signed)
History    CSN: [id number] Arrival date & time 11/14/12  1108 First MD Initiated Contact with Patient 11/14/12 1114      Chief Complaint  Patient presents with  . Fall    HPI Patient presents to the emergency room today after having a fall. [facility]. She started to go down an incline she lost her balance and fell. Patient does not think she struck her head. She did not lose consciousness. She denies any neck pain. She does have pain now in her right shoulder arm and elbow. She denies any hip pain or leg pain. She denies any other complaints  The pain is mild to moderate and arm. Movement and palpation increases the pain. She does not want any pain medications at this time Past Medical History  Diagnosis Date  . Diabetes mellitus   . Hypertension   . Atrial fibrillation   . Hyperlipidemia   . Peripheral neuropathy   . Gastric AVM   . GERD (gastroesophageal reflux disease)   . Diabetic Charcot foot   . Shortness of breath     Past Surgical History  Procedure Laterality Date  . Tonsillectomy    . Lung removal, partial    . Thyroidectomy, partial    . Hip arthroplasty Left 08/18/2012    Procedure: ARTHROPLASTY BIPOLAR HIP;  Surgeon: Eldred Manges, MD;  Location: [facility];  Service: Orthopedics;  Laterality: Left;    Family History  Problem Relation Age of Onset  . Cancer Mother 48    melanoma  . Aneurysm Father     stomach aneurysm    History  Substance Use Topics  . Smoking status: Former Smoker -- 0.50 packs/day for 10 years    Types: Cigarettes    Quit date: 04/28/2002  . Smokeless tobacco: Not on file  . Alcohol Use: No    OB History   Grav Para Term Preterm Abortions TAB SAB Ect Mult Living                  Review of Systems  All other systems reviewed and are negative.    Allergies  Aspirin; Indapamide; Metformin and related; Penicillins; Sulfa antibiotics; and Tiotropium bromide monohydrate  Home  Medications   Current Outpatient Rx  Name  Route  Sig  Dispense  Refill  . budesonide (PULMICORT) 180 MCG/ACT inhaler   Inhalation   Inhale 1 puff into the lungs 2 (two) times daily.         . calcium carbonate (OS-CAL) 600 MG TABS   Oral   Take 600 mg by mouth 2 (two) times daily with a meal.         . Cholecalciferol (VITAMIN D-3) 5000 UNITS TABS   Oral   Take 1 tablet by mouth every morning.          . diltiazem (CARDIZEM) 60 MG tablet   Oral   Take 60 mg by mouth every 8 (eight) hours.         . furosemide (LASIX) 20 MG tablet   Oral   Take 20 mg by mouth every morning.         Marland Kitchen guaiFENesin (MUCINEX) 600 MG 12 hr tablet   Oral   Take 1 tablet (600 mg total) by mouth 2 (two) times daily.   60 tablet   0   . HYDROcodone-acetaminophen (NORCO) 5-325 MG per tablet      Take one tablet by mouth  twice daily for pain   60 tablet   5   . pantoprazole (PROTONIX) 40 MG tablet   Oral   Take 40 mg by mouth every morning.         . polyethylene glycol powder (GLYCOLAX/MIRALAX) powder   Oral   Take 17 g by mouth every morning.         . potassium chloride (K-DUR) 10 MEQ tablet   Oral   Take 10 mEq by mouth every morning.         . sennosides-docusate sodium (SENOKOT-S) 8.6-50 MG tablet   Oral   Take 1 tablet by mouth every morning.         . therapeutic multivitamin-minerals (THERAGRAN-M) tablet   Oral   Take 1 tablet by mouth every morning.         . zolpidem (AMBIEN) 5 MG tablet   Oral   Take 1-2 tablets (5-10 mg total) by mouth at bedtime as needed for sleep.   30 tablet   0   . EXPIRED: Budesonide 90 MCG/ACT inhaler   Inhalation   Inhale 1 puff into the lungs 2 (two) times daily.   1 each   0   . glipiZIDE (GLUCOTROL) 5 MG tablet   Oral   Take 10 mg by mouth 2 (two) times daily before a meal.         . glyBURIDE (DIABETA) 5 MG tablet   Oral   Take 10 mg by mouth 2 (two) times daily with a meal.         . EXPIRED: potassium  chloride (K-DUR) 10 MEQ tablet   Oral   Take 1 tablet (10 mEq total) by mouth daily.   30 tablet   0   . traMADol (ULTRAM) 50 MG tablet   Oral   Take 1 tablet (50 mg total) by mouth every 8 (eight) hours as needed for pain. pain   30 tablet   0   . warfarin (COUMADIN) 2 MG tablet   Oral   Take 1 tablet (2 mg total) by mouth daily.           BP 145/68  Pulse 89  Temp(Src) 98.1 F (36.7 C) (Oral)  Ht 5\' 3"  (1.6 m)  Wt 110 lb (49.896 kg)  BMI 19.49 kg/m2  SpO2 96%  LMP 07/29/1960  Physical Exam  Nursing note and vitals reviewed. Constitutional: She appears well-developed and well-nourished. No distress.  HENT:  Head: Normocephalic.  Right Ear: External ear normal.  Left Ear: External ear normal.  1-2 mm abrasion noted on the chin, no other evidence of injury  Eyes: Conjunctivae are normal. Right eye exhibits no discharge. Left eye exhibits no discharge. No scleral icterus.  Neck: Neck supple. No tracheal deviation present.  Cervical spine is nontender  Cardiovascular: Normal rate and intact distal pulses.  An irregularly irregular rhythm present.  Pulmonary/Chest: Effort normal and breath sounds normal. No stridor. No respiratory distress. She has no wheezes. She has no rales.  Abdominal: Soft. Bowel sounds are normal. She exhibits no distension. There is no tenderness. There is no rebound and no guarding.  Musculoskeletal: She exhibits no edema and no tenderness.       Right shoulder: She exhibits decreased range of motion and tenderness.       Right elbow: She exhibits normal range of motion, no swelling and no effusion. Tenderness found.       Right upper arm: She exhibits tenderness and bony tenderness.  Neurological: She is alert. She has normal strength. No sensory deficit. Cranial nerve deficit:  no gross defecits noted. She exhibits normal muscle tone. She displays no seizure activity. Coordination normal.  Skin: Skin is warm and dry. No rash noted.   Psychiatric: She has a normal mood and affect.    ED Course  Procedures (including critical care time)  Labs Reviewed  CBC WITH DIFFERENTIAL - Abnormal; Notable for the following:    WBC 11.3 (*)    Hemoglobin 11.7 (*)    Neutrophils Relative % 83 (*)    Neutro Abs 9.5 (*)    All other components within normal limits  BASIC METABOLIC PANEL - Abnormal; Notable for the following:    Sodium 134 (*)    Glucose, Bld 222 (*)    BUN 31 (*)    Creatinine, Ser 1.34 (*)    GFR calc non Af Amer 34 (*)    GFR calc Af Amer 39 (*)    All other components within normal limits  PROTIME-INR   Dg Shoulder Right  11/14/2012   *RADIOLOGY REPORT*  Clinical Data: Right shoulder pain following a fall.  RIGHT SHOULDER - 2+ VIEW  Comparison: None.  Findings: Comminuted, mildly impacted right humeral neck fracture without significant angulation or displacement.  No dislocation. Linear atelectasis or scarring at the right lung base in diffuse prominence of the interstitial markings in both lungs.  IMPRESSION:  1.  Comminuted, mildly impacted right humeral neck fracture. 2.  Chronic interstitial lung disease with right basilar linear atelectasis or scarring.   Original Report Authenticated By: Beckie Salts, M.D.   Dg Elbow Complete Right  11/14/2012   *RADIOLOGY REPORT*  Clinical Data: Shoulder and elbow pain status post fall.  RIGHT ELBOW - COMPLETE 3+ VIEW  Comparison: None.  Findings: The bones are demineralized.  Skin folds overlie the distal humerus on the AP and oblique views.  There is no evidence of acute fracture, dislocation or large elbow joint effusion.  The patient could not position for a true lateral view of the elbow due to the shoulder injury.  IMPRESSION: No evidence of acute fracture or dislocation at the right elbow.   Original Report Authenticated By: Carey Bullocks, M.D.   Ct Head Wo Contrast  11/14/2012   *RADIOLOGY REPORT*  Clinical Data: Post fall  CT HEAD WITHOUT CONTRAST  Technique:   Contiguous axial images were obtained from the base of the skull through the vertex without contrast.  Comparison: None.  Findings: There is advanced atrophy with sulcal prominence and central volume loss with commiserate ex vacuo dilatation of the ventricular system.  There is advanced periventricular hypodensities, left slightly greater than right, compatible with microvascular vena disease.  Given extensive background parenchymal abnormality, there is no CT evidence of acute large territory infarct.  No interparenchymal or extra-axial mass or hemorrhage. Normal configuration of the ventricles and basilar cisterns.  No midline shift.  Limited visualization of the paranasal sinuses and mastoid air cells are normal.  No air fluid levels.  Post bilateral cataract surgery.  The regional soft tissues are normal.  No displaced calvarial fracture.  IMPRESSION: Advanced atrophy and microvascular ischemic disease without definite acute intracranial process.   Original Report Authenticated By: Tacey Ruiz, MD   Dg Humerus Right  11/14/2012   *RADIOLOGY REPORT*  Clinical Data: Right shoulder pain following a fall.  RIGHT HUMERUS - 2+ VIEW  Comparison: Right shoulder radiographs obtained at the same time.  Findings: Again demonstrated is  a comminuted, mildly impacted right humeral neck fracture.  Some anterior displacement of the distal fragment on the current lateral view.  IMPRESSION: Comminuted, mildly impacted right humeral neck fracture.   Original Report Authenticated By: Beckie Salts, M.D.     1. Proximal humerus fracture, right, closed, initial encounter       MDM  Patient does have a proximal humerus fracture. Fortunately she does not appear to have any other serious injury. The patient was placed in a sling and discharged to follow up with orthopedic Dr.        Celene Kras, MD 11/14/12 1326

## 2012-11-14 NOTE — ED Notes (Addendum)
Pt reports she going down the hill with her walker when she fell-landing on her R arm.  Pt denies hitting her head or LOC at this time.  Pt is A&O x2.  Denies any other complaints.  Small lac noted on pt's R hand.  No active bleeding noted at this time.

## 2012-11-14 NOTE — Progress Notes (Signed)
CSW confirmed that patient is resident at [facility].   Marland KitchenCatha Gosselin, Theresia Majors  [phone] .11/14/2012 1256pm

## 2012-11-14 NOTE — ED Notes (Signed)
Per EMS, pt fell at the [facility] this am.  Pt was walking with  Her walker and fell.  Landed on her R arm.  Pt reports R upper arm pain.  No deformity noted per EMS.  Pt is A&O x 4.

## 2013-05-27 ENCOUNTER — Inpatient Hospital Stay (HOSPITAL_COMMUNITY)
Admission: EM | Admit: 2013-05-27 | Discharge: 2013-05-31 | DRG: 177 | Disposition: A | Discharge status: Skilled Nursing Facility | Payer: Medicare Other | Attending: Internal Medicine | Admitting: Internal Medicine

## 2013-05-27 ENCOUNTER — Encounter (HOSPITAL_COMMUNITY): Payer: Self-pay | Admitting: *Deleted

## 2013-05-27 ENCOUNTER — Emergency Department (HOSPITAL_COMMUNITY): Payer: Medicare Other

## 2013-05-27 DIAGNOSIS — J441 Chronic obstructive pulmonary disease with (acute) exacerbation: Secondary | ICD-10-CM | POA: Diagnosis present

## 2013-05-27 DIAGNOSIS — Z96649 Presence of unspecified artificial hip joint: Secondary | ICD-10-CM

## 2013-05-27 DIAGNOSIS — J189 Pneumonia, unspecified organism: Secondary | ICD-10-CM | POA: Diagnosis present

## 2013-05-27 DIAGNOSIS — E119 Type 2 diabetes mellitus without complications: Secondary | ICD-10-CM | POA: Diagnosis present

## 2013-05-27 DIAGNOSIS — E871 Hypo-osmolality and hyponatremia: Secondary | ICD-10-CM | POA: Diagnosis present

## 2013-05-27 DIAGNOSIS — E1129 Type 2 diabetes mellitus with other diabetic kidney complication: Secondary | ICD-10-CM | POA: Diagnosis present

## 2013-05-27 DIAGNOSIS — E1165 Type 2 diabetes mellitus with hyperglycemia: Secondary | ICD-10-CM | POA: Diagnosis present

## 2013-05-27 DIAGNOSIS — I5031 Acute diastolic (congestive) heart failure: Secondary | ICD-10-CM | POA: Diagnosis present

## 2013-05-27 DIAGNOSIS — I4891 Unspecified atrial fibrillation: Secondary | ICD-10-CM | POA: Diagnosis present

## 2013-05-27 DIAGNOSIS — N179 Acute kidney failure, unspecified: Secondary | ICD-10-CM | POA: Diagnosis present

## 2013-05-27 DIAGNOSIS — D649 Anemia, unspecified: Secondary | ICD-10-CM | POA: Diagnosis present

## 2013-05-27 DIAGNOSIS — D638 Anemia in other chronic diseases classified elsewhere: Secondary | ICD-10-CM | POA: Diagnosis present

## 2013-05-27 DIAGNOSIS — R739 Hyperglycemia, unspecified: Secondary | ICD-10-CM

## 2013-05-27 DIAGNOSIS — I959 Hypotension, unspecified: Secondary | ICD-10-CM | POA: Diagnosis present

## 2013-05-27 DIAGNOSIS — E785 Hyperlipidemia, unspecified: Secondary | ICD-10-CM | POA: Diagnosis present

## 2013-05-27 DIAGNOSIS — J690 Pneumonitis due to inhalation of food and vomit: Principal | ICD-10-CM | POA: Diagnosis present

## 2013-05-27 DIAGNOSIS — J9600 Acute respiratory failure, unspecified whether with hypoxia or hypercapnia: Secondary | ICD-10-CM | POA: Diagnosis present

## 2013-05-27 DIAGNOSIS — I509 Heart failure, unspecified: Secondary | ICD-10-CM | POA: Diagnosis present

## 2013-05-27 DIAGNOSIS — K219 Gastro-esophageal reflux disease without esophagitis: Secondary | ICD-10-CM | POA: Diagnosis present

## 2013-05-27 DIAGNOSIS — F0390 Unspecified dementia without behavioral disturbance: Secondary | ICD-10-CM

## 2013-05-27 DIAGNOSIS — I10 Essential (primary) hypertension: Secondary | ICD-10-CM | POA: Diagnosis present

## 2013-05-27 DIAGNOSIS — Z87891 Personal history of nicotine dependence: Secondary | ICD-10-CM

## 2013-05-27 DIAGNOSIS — D72829 Elevated white blood cell count, unspecified: Secondary | ICD-10-CM | POA: Diagnosis present

## 2013-05-27 DIAGNOSIS — Z808 Family history of malignant neoplasm of other organs or systems: Secondary | ICD-10-CM

## 2013-05-27 DIAGNOSIS — I129 Hypertensive chronic kidney disease with stage 1 through stage 4 chronic kidney disease, or unspecified chronic kidney disease: Secondary | ICD-10-CM | POA: Diagnosis present

## 2013-05-27 DIAGNOSIS — N189 Chronic kidney disease, unspecified: Secondary | ICD-10-CM | POA: Diagnosis present

## 2013-05-27 LAB — MAGNESIUM: Magnesium: 1.7 mg/dL (ref 1.5–2.5)

## 2013-05-27 LAB — COMPREHENSIVE METABOLIC PANEL
ALT: 13 U/L (ref 0–35)
Albumin: 3.1 g/dL — ABNORMAL LOW (ref 3.5–5.2)
Alkaline Phosphatase: 97 U/L (ref 39–117)
BUN: 37 mg/dL — ABNORMAL HIGH (ref 6–23)
Chloride: 101 mEq/L (ref 96–112)
Creatinine, Ser: 1.45 mg/dL — ABNORMAL HIGH (ref 0.50–1.10)
GFR calc Af Amer: 36 mL/min — ABNORMAL LOW (ref >90)
Glucose, Bld: 278 mg/dL — ABNORMAL HIGH (ref 70–99)
Potassium: 4.8 mEq/L (ref 3.5–5.1)
Sodium: 132 mEq/L — ABNORMAL LOW (ref 135–145)
Total Bilirubin: 0.4 mg/dL (ref 0.3–1.2)

## 2013-05-27 LAB — CBC WITH DIFFERENTIAL/PLATELET
Basophils Absolute: 0 10*3/uL (ref 0.0–0.1)
Eosinophils Absolute: 0 10*3/uL (ref 0.0–0.7)
Eosinophils Absolute: 0 10*3/uL (ref 0.0–0.7)
Eosinophils Relative: 0 % (ref 0–5)
Eosinophils Relative: 0 % (ref 0–5)
HCT: 28.9 % — ABNORMAL LOW (ref 36.0–46.0)
Hemoglobin: 9.4 g/dL — ABNORMAL LOW (ref 12.0–15.0)
Lymphocytes Relative: 3 % — ABNORMAL LOW (ref 12–46)
Lymphs Abs: 0.4 10*3/uL — ABNORMAL LOW (ref 0.7–4.0)
MCH: 29.9 pg (ref 26.0–34.0)
MCH: 29.7 pg (ref 26.0–34.0)
MCV: 91.5 fL (ref 78.0–100.0)
Monocytes Absolute: 0.1 10*3/uL (ref 0.1–1.0)
Monocytes Relative: 1 % — ABNORMAL LOW (ref 3–12)
Neutrophils Relative %: 96 % — ABNORMAL HIGH (ref 43–77)
Platelets: 220 10*3/uL (ref 150–400)
Platelets: 211 10*3/uL (ref 150–400)
RBC: 3.17 MIL/uL — ABNORMAL LOW (ref 3.87–5.11)
RDW: 13.2 % (ref 11.5–15.5)
WBC: 12.9 10*3/uL — ABNORMAL HIGH (ref 4.0–10.5)

## 2013-05-27 LAB — POCT I-STAT TROPONIN I: Troponin i, poc: 0.01 ng/mL (ref 0.00–0.08)

## 2013-05-27 LAB — BASIC METABOLIC PANEL
Calcium: 9.2 mg/dL (ref 8.4–10.5)
GFR calc non Af Amer: 30 mL/min — ABNORMAL LOW (ref >90)
Sodium: 128 mEq/L — ABNORMAL LOW (ref 135–145)

## 2013-05-27 LAB — GLUCOSE, CAPILLARY: Glucose-Capillary: 396 mg/dL — ABNORMAL HIGH (ref 70–99)

## 2013-05-27 LAB — PROTIME-INR: INR: 0.92 (ref 0.00–1.49)

## 2013-05-27 MED ORDER — ALBUTEROL SULFATE (2.5 MG/3ML) 0.083% IN NEBU
2.5000 mg | INHALATION_SOLUTION | RESPIRATORY_TRACT | Status: DC | PRN
  Filled 2013-05-27: qty 0.5
  Filled 2013-05-27: qty 3

## 2013-05-27 MED ORDER — IPRATROPIUM BROMIDE 0.02 % IN SOLN
0.5000 mg | RESPIRATORY_TRACT | Status: DC
  Administered 2013-05-27 – 2013-05-29 (×12): 0.5 mg via RESPIRATORY_TRACT
  Filled 2013-05-27 (×10): qty 2.5

## 2013-05-27 MED ORDER — POTASSIUM CHLORIDE ER 10 MEQ PO TBCR
10.0000 meq | EXTENDED_RELEASE_TABLET | Freq: Every day | ORAL | Status: DC
  Administered 2013-05-28 – 2013-05-31 (×4): 10 meq via ORAL
  Filled 2013-05-27 (×4): qty 1

## 2013-05-27 MED ORDER — TRAMADOL HCL 50 MG PO TABS: 50.0000 mg | ORAL_TABLET | Freq: Three times a day (TID) | ORAL | Status: DC | PRN

## 2013-05-27 MED ORDER — DILTIAZEM HCL 25 MG/5ML IV SOLN
10.0000 mg | Freq: Once | INTRAVENOUS | Status: AC
  Administered 2013-05-27: 10 mg via INTRAVENOUS
  Filled 2013-05-27: qty 5

## 2013-05-27 MED ORDER — DEXTROSE 5 % IV SOLN
500.0000 mg | Freq: Once | INTRAVENOUS | Status: AC
  Administered 2013-05-27: 500 mg via INTRAVENOUS

## 2013-05-27 MED ORDER — SODIUM CHLORIDE 0.9 % IV SOLN
INTRAVENOUS | Status: DC
  Administered 2013-05-27: 18:00:00 via INTRAVENOUS

## 2013-05-27 MED ORDER — ACETAMINOPHEN 650 MG RE SUPP: 650.0000 mg | Freq: Four times a day (QID) | RECTAL | Status: DC | PRN

## 2013-05-27 MED ORDER — DEXTROSE 5 % IV SOLN
1.0000 g | INTRAVENOUS | Status: DC
  Administered 2013-05-28: 1 g via INTRAVENOUS
  Filled 2013-05-27 (×2): qty 10

## 2013-05-27 MED ORDER — LEVOFLOXACIN 500 MG PO TABS: 750.0000 mg | ORAL_TABLET | Freq: Once | ORAL | Status: DC

## 2013-05-27 MED ORDER — SENNOSIDES-DOCUSATE SODIUM 8.6-50 MG PO TABS
1.0000 | ORAL_TABLET | Freq: Every morning | ORAL | Status: DC
  Administered 2013-05-28 – 2013-05-31 (×4): 1 via ORAL
  Filled 2013-05-27 (×4): qty 1

## 2013-05-27 MED ORDER — CALCIUM CARBONATE 600 MG PO TABS
600.0000 mg | ORAL_TABLET | Freq: Two times a day (BID) | ORAL | Status: DC
  Filled 2013-05-27: qty 1

## 2013-05-27 MED ORDER — MIRTAZAPINE 15 MG PO TABS
15.0000 mg | ORAL_TABLET | Freq: Every day | ORAL | Status: DC
  Administered 2013-05-27 – 2013-05-30 (×4): 15 mg via ORAL
  Filled 2013-05-27 (×5): qty 1

## 2013-05-27 MED ORDER — SODIUM CHLORIDE 0.9 % IV BOLUS (SEPSIS)
500.0000 mL | Freq: Once | INTRAVENOUS | Status: AC
  Administered 2013-05-27: 500 mL via INTRAVENOUS

## 2013-05-27 MED ORDER — ENSURE COMPLETE PO LIQD
237.0000 mL | Freq: Three times a day (TID) | ORAL | Status: DC
  Administered 2013-05-28 – 2013-05-31 (×8): 237 mL via ORAL
  Filled 2013-05-27 (×2): qty 237

## 2013-05-27 MED ORDER — INSULIN ASPART 100 UNIT/ML ~~LOC~~ SOLN
0.0000 [IU] | Freq: Three times a day (TID) | SUBCUTANEOUS | Status: DC
  Administered 2013-05-28: 12:00:00 2 [IU] via SUBCUTANEOUS
  Administered 2013-05-28 (×2): 3 [IU] via SUBCUTANEOUS
  Administered 2013-05-29 (×2): 2 [IU] via SUBCUTANEOUS
  Administered 2013-05-29 – 2013-05-30 (×2): 5 [IU] via SUBCUTANEOUS
  Administered 2013-05-30 (×2): 3 [IU] via SUBCUTANEOUS
  Administered 2013-05-31: 13:00:00 1 [IU] via SUBCUTANEOUS
  Administered 2013-05-31: 08:00:00 2 [IU] via SUBCUTANEOUS

## 2013-05-27 MED ORDER — HYDROCODONE-ACETAMINOPHEN 5-325 MG PO TABS
1.0000 | ORAL_TABLET | ORAL | Status: DC | PRN
  Administered 2013-05-28: 1 via ORAL
  Filled 2013-05-27 (×2): qty 1

## 2013-05-27 MED ORDER — DEXTROSE 5 % IV SOLN
500.0000 mg | INTRAVENOUS | Status: DC
  Administered 2013-05-28: 15:00:00 500 mg via INTRAVENOUS
  Filled 2013-05-27 (×2): qty 500

## 2013-05-27 MED ORDER — ADULT MULTIVITAMIN W/MINERALS CH
1.0000 | ORAL_TABLET | Freq: Every morning | ORAL | Status: DC
  Administered 2013-05-28 – 2013-05-31 (×4): 1 via ORAL
  Filled 2013-05-27 (×4): qty 1

## 2013-05-27 MED ORDER — VITAMIN D 1000 UNITS PO TABS
5000.0000 [IU] | ORAL_TABLET | Freq: Every morning | ORAL | Status: DC
  Administered 2013-05-28 – 2013-05-29 (×2): 5000 [IU] via ORAL
  Filled 2013-05-27 (×2): qty 5

## 2013-05-27 MED ORDER — POLYETHYLENE GLYCOL 3350 17 GM/SCOOP PO POWD
17.0000 g | Freq: Every morning | ORAL | Status: DC
  Filled 2013-05-27: qty 255

## 2013-05-27 MED ORDER — ALBUTEROL SULFATE (2.5 MG/3ML) 0.083% IN NEBU
2.5000 mg | INHALATION_SOLUTION | RESPIRATORY_TRACT | Status: DC
  Administered 2013-05-27 – 2013-05-29 (×12): 2.5 mg via RESPIRATORY_TRACT
  Filled 2013-05-27 (×3): qty 0.5
  Filled 2013-05-27: qty 3
  Filled 2013-05-27: qty 0.5
  Filled 2013-05-27 (×3): qty 3
  Filled 2013-05-27: qty 0.5
  Filled 2013-05-27: qty 3
  Filled 2013-05-27 (×6): qty 0.5

## 2013-05-27 MED ORDER — ACETAMINOPHEN 325 MG PO TABS
650.0000 mg | ORAL_TABLET | Freq: Four times a day (QID) | ORAL | Status: DC | PRN
  Administered 2013-05-30: 650 mg via ORAL
  Filled 2013-05-27: qty 2

## 2013-05-27 MED ORDER — ONDANSETRON HCL 4 MG/2ML IJ SOLN: 4.0000 mg | Freq: Four times a day (QID) | INTRAMUSCULAR | Status: DC | PRN

## 2013-05-27 MED ORDER — POLYETHYLENE GLYCOL 3350 17 G PO PACK
17.0000 g | PACK | Freq: Every day | ORAL | Status: DC
  Administered 2013-05-28 – 2013-05-30 (×3): 17 g via ORAL
  Filled 2013-05-27 (×4): qty 1

## 2013-05-27 MED ORDER — IPRATROPIUM BROMIDE 0.02 % IN SOLN: 0.5000 mg | RESPIRATORY_TRACT | Status: DC | PRN

## 2013-05-27 MED ORDER — GUAIFENESIN ER 600 MG PO TB12
600.0000 mg | ORAL_TABLET | Freq: Two times a day (BID) | ORAL | Status: DC
  Administered 2013-05-27 – 2013-05-31 (×8): 600 mg via ORAL
  Filled 2013-05-27 (×9): qty 1

## 2013-05-27 MED ORDER — FUROSEMIDE 20 MG PO TABS: 20.0000 mg | ORAL_TABLET | Freq: Every morning | ORAL | Status: DC

## 2013-05-27 MED ORDER — OSELTAMIVIR PHOSPHATE 30 MG PO CAPS
30.0000 mg | ORAL_CAPSULE | Freq: Every day | ORAL | Status: DC
  Administered 2013-05-27: 23:00:00 30 mg via ORAL
  Filled 2013-05-27 (×2): qty 1

## 2013-05-27 MED ORDER — ONDANSETRON HCL 4 MG PO TABS: 4.0000 mg | ORAL_TABLET | Freq: Four times a day (QID) | ORAL | Status: DC | PRN

## 2013-05-27 MED ORDER — CALCIUM CARBONATE 1250 (500 CA) MG PO TABS
1.0000 | ORAL_TABLET | Freq: Two times a day (BID) | ORAL | Status: DC
  Administered 2013-05-28 – 2013-05-30 (×6): 500 mg via ORAL
  Filled 2013-05-27 (×9): qty 1

## 2013-05-27 MED ORDER — SODIUM CHLORIDE 0.9 % IJ SOLN
3.0000 mL | Freq: Two times a day (BID) | INTRAMUSCULAR | Status: DC
  Administered 2013-05-28: 3 mL via INTRAVENOUS

## 2013-05-27 MED ORDER — DILTIAZEM HCL 60 MG PO TABS
60.0000 mg | ORAL_TABLET | Freq: Three times a day (TID) | ORAL | Status: DC
  Administered 2013-05-27 – 2013-05-28 (×4): 60 mg via ORAL
  Filled 2013-05-27 (×9): qty 1

## 2013-05-27 MED ORDER — MORPHINE SULFATE 2 MG/ML IJ SOLN
1.0000 mg | INTRAMUSCULAR | Status: DC | PRN
  Administered 2013-05-30: 1 mg via INTRAVENOUS
  Filled 2013-05-27: qty 1

## 2013-05-27 MED ORDER — DEXTROSE 5 % IV SOLN
1.0000 g | Freq: Once | INTRAVENOUS | Status: AC
  Administered 2013-05-27: 1 g via INTRAVENOUS
  Filled 2013-05-27: qty 10

## 2013-05-27 MED ORDER — PANTOPRAZOLE SODIUM 40 MG PO TBEC
40.0000 mg | DELAYED_RELEASE_TABLET | Freq: Every morning | ORAL | Status: DC
  Administered 2013-05-28 – 2013-05-31 (×4): 40 mg via ORAL
  Filled 2013-05-27 (×4): qty 1

## 2013-05-27 MED ORDER — PREDNISONE 20 MG PO TABS
60.0000 mg | ORAL_TABLET | Freq: Once | ORAL | Status: AC
  Administered 2013-05-27: 60 mg via ORAL
  Filled 2013-05-27: qty 3

## 2013-05-27 NOTE — H&P (Signed)
[facility] History and Physical  Lori [name] YNW:[id number] DOB: January 22, 1921 DOA: 05/27/2013  Referring physician: ED physician PCP: Thayer Headings, MD   Chief Complaint: shortness of breath  HPI:  This is a 79 year old female with a history of Diabetes type 2 (last HgA1C in 07/2012 was 6.7 at which time she was on Glyburide and glipizide), hypertension, atrial fibrillation who presented to [facility] ED with main concern of progressively worsening shortness of breath that initially stared with exertion several days prior to this admission, and has progressed to dyspnea at rest, associated with non productive cough, subjective fevers, chills, malaise, poor oral intake. Pt is resident of ALF and upon arrival to ED was unable to explain why she came in and at this time she denies chest pain or shortness of breath, no specific abdominal or urinary concerns, no specific neurological symptoms. Pt denies specific known sick contacts or exposure.  In ED, pt found to have low grade fever of 101 F, CXR worrisome for PNA, electrolyte panel with Cr 1.45, leukocytosis of ~14K. TRH asked to admit to telemetry bed for further evaluation. On telemetry monitor pt also found to be in a-fib with RVR and HR in 110-120.   Assessment and Plan: Principal Problem:   Community acquired pneumonia - will admit to telemetry floor - start with providing supportive care with empiric abx, prednisone, BD's as needed - will also obtain sputum analysis, urine legionella and strep pneumo - obtain influenza panel as well  - place on empiric Tamiflu and discontinue if Influenza panel is negative  Active Problems:   Atrial fibrillation with RVR - place on Cardizem 60 mg Q8 hours and monitor vitals on telemetry unit - readjust the dose for appropriate HR control - continue coumadin per pharmacy    COPD exacerbation - started Prednisone, BD's as needed, oxygen via [place], empiric ABX as noted above    HTN (hypertension) -  reasonably stable BP on admission    Anemia of chronic disease  - no signs of active bleed - obtain CBC in AM   GERD (gastroesophageal reflux disease) - continue PPI, especially since pt on Prednisone    Hyponatremia - likely pre renal in etiology - hold Lasix temporarily  - pt has received 1 L of IVF in ED   Leukocytosis - secondary to PNA - ABX as noted above and repeat CBC in AM   Acute renal failure - likely pre renal and use of Lasix  - will hold Lasix, pt has received 1 L NS boles in ED, will hold IVF - repeat BMP in AM and encourage PO intake    Diabetes Mellitus - last A1C in 07/2012 was 6.7 - will place on SSI as pt is on Prednisone   Code Status: Full Family Communication: Pt at bedside Disposition Plan: Admit to telemetry bed   Review of Systems:  Constitutional: Negative for diaphoresis.  HENT: Negative for hearing loss, ear pain, nosebleeds, sore throat, neck pain, tinnitus and ear discharge.   Eyes: Negative for blurred vision, double vision, photophobia, pain, discharge and redness.  Respiratory: Negative for wheezing and stridor.   Cardiovascular: Negative for chest pain, palpitations, orthopnea, claudication and leg swelling.  Gastrointestinal: Negative for heartburn, constipation, blood in stool and melena.  Genitourinary: Negative for dysuria, urgency, frequency, hematuria and flank pain.  Musculoskeletal: Negative for myalgias, back pain, joint pain and falls.  Skin: Negative for itching and rash.  Neurological: Negative for tingling, tremors, sensory change, speech change, focal weakness, loss of  consciousness and headaches.  Endo/Heme/Allergies: Negative for environmental allergies and polydipsia. Does not bruise/bleed easily.  Psychiatric/Behavioral: Negative for suicidal ideas. The patient is not nervous/anxious.      Past Medical History  Diagnosis Date  . Diabetes mellitus   . Hypertension   . Atrial fibrillation   . Hyperlipidemia   .  Peripheral neuropathy   . Gastric AVM   . GERD (gastroesophageal reflux disease)   . Diabetic Charcot foot   . Shortness of breath     Past Surgical History  Procedure Laterality Date  . Tonsillectomy    . Lung removal, partial    . Thyroidectomy, partial    . Hip arthroplasty Left 08/18/2012    Procedure: ARTHROPLASTY BIPOLAR HIP;  Surgeon: Eldred Manges, MD;  Location: [facility];  Service: Orthopedics;  Laterality: Left;    Social History:  reports that she quit smoking about 11 years ago. Her smoking use included Cigarettes. She has a 5 pack-year smoking history. She does not have any smokeless tobacco history on file. She reports that she does not drink alcohol or use illicit drugs.  Allergies  Allergen Reactions  . Aspirin Other (See Comments)    Unknown   . Indapamide Other (See Comments)    Unknown   . Metformin And Related Other (See Comments)    Unknown   . Penicillins Other (See Comments)    Unknown   . Sulfa Antibiotics Other (See Comments)    Unknown   . Tiotropium Bromide Monohydrate Other (See Comments)    Unknown     Family History  Problem Relation Age of Onset  . Cancer Mother 33    melanoma  . Aneurysm Father     stomach aneurysm    Prior to Admission medications   Medication Sig Start Date End Date Taking? Authorizing Provider  calcium carbonate (OS-CAL) 600 MG TABS Take 600 mg by mouth 2 (two) times daily with a meal.   Yes Historical Provider, MD  Cholecalciferol (VITAMIN D-3) 5000 UNITS TABS Take 1 tablet by mouth every morning.    Yes Historical Provider, MD  diltiazem (CARDIZEM) 60 MG tablet Take 60 mg by mouth every 8 (eight) hours. 08/06/11 08/05/13 Yes Sosan Forrestine Him, MD  ENSURE (ENSURE) Take 237 mLs by mouth 3 (three) times daily between meals.   Yes Historical Provider, MD  furosemide (LASIX) 20 MG tablet Take 20 mg by mouth every morning. 08/06/11 08/05/13 Yes Sosan Forrestine Him, MD  guaiFENesin (MUCINEX) 600 MG 12 hr tablet Take 1 tablet (600  mg total) by mouth 2 (two) times daily. 08/22/12  Yes Dorothea Ogle, MD  HYDROcodone-acetaminophen (NORCO) 5-325 MG per tablet Take 1 tablet by mouth every 6 (six) hours as needed for pain. 11/14/12  Yes Celene Kras, MD  mirtazapine (REMERON) 15 MG tablet Take 15 mg by mouth at bedtime.   Yes Historical Provider, MD  pantoprazole (PROTONIX) 40 MG tablet Take 40 mg by mouth every morning. 08/06/11 08/05/13 Yes Sosan Forrestine Him, MD  polyethylene glycol powder (GLYCOLAX/MIRALAX) powder Take 17 g by mouth every morning.   Yes Historical Provider, MD  potassium chloride (K-DUR) 10 MEQ tablet Take 1 tablet (10 mEq total) by mouth daily. 08/06/11 05/27/13 Yes Sosan Forrestine Him, MD  sennosides-docusate sodium (SENOKOT-S) 8.6-50 MG tablet Take 1 tablet by mouth every morning.   Yes Historical Provider, MD  therapeutic multivitamin-minerals (THERAGRAN-M) tablet Take 1 tablet by mouth every morning.   Yes Historical Provider, MD  budesonide (PULMICORT)  180 MCG/ACT inhaler Inhale 1 puff into the lungs 2 (two) times daily.    Historical Provider, MD  traMADol (ULTRAM) 50 MG tablet Take 1 tablet (50 mg total) by mouth every 8 (eight) hours as needed for pain. pain 11/14/12   Celene Kras, MD    Physical Exam: Filed Vitals:   05/27/13 1730 05/27/13 1749 05/27/13 1822 05/27/13 1849  BP:  129/55  123/65  Pulse: 127 128  110  Temp:  99.6 F (37.6 C) 100.5 F (38.1 C) 98.7 F (37.1 C)  TempSrc:  Oral Oral Oral  Resp: 21 28  24   Height:    5\' 5"  (1.651 m)  Weight:    46.1 kg (101 lb 10.1 oz)  SpO2: 99% 93%  98%    Physical Exam  Constitutional: Appears frail and cachectic, NAD HENT: Normocephalic. External right and left ear normal. Dry MM Eyes: Conjunctivae and EOM are normal. PERRLA, no scleral icterus.  Neck: Normal ROM. Neck supple. No JVD. No tracheal deviation. No thyromegaly.  CVS: Irregular rate and rhythm, no gallops, no carotid bruit.  Pulmonary: Effort and breath sounds normal, no stridor, rhonchi at  bases  Abdominal: Soft. BS +,  no distension, tenderness, rebound or guarding.  Musculoskeletal: Normal range of motion. No edema and no tenderness.  Lymphadenopathy: No lymphadenopathy noted, cervical, inguinal. Neuro: Alert. Normal reflexes, muscle tone coordination. No cranial nerve deficit. Skin: Skin is warm and dry. No rash noted. Not diaphoretic. No erythema. No pallor.  Psychiatric: Normal mood and affect.  Labs on Admission:  Basic Metabolic Panel:  Recent Labs Lab 05/27/13 1446 05/27/13 1735  NA 128* 132*  K 4.4 4.8  CL 96 101  CO2 21 23  GLUCOSE 357* 278*  BUN 38* 37*  CREATININE 1.47* 1.45*  CALCIUM 9.2 9.4  MG  --  1.7  PHOS  --  2.3   Liver Function Tests:  Recent Labs Lab 05/27/13 1735  AST 19  ALT 13  ALKPHOS 97  BILITOT 0.4  PROT 7.8  ALBUMIN 3.1*   CBC:  Recent Labs Lab 05/27/13 1446  WBC 14.5*  NEUTROABS 11.2*  HGB 9.8*  HCT 30.0*  MCV 91.5  PLT 220   Radiological Exams on Admission: Dg Chest 2 View   05/27/2013  Potential subtle left perihilar mid lung infiltrate superimposed on significant chronic lung disease.     EKG: Normal sinus rhythm, no ST/T wave changes  Debbora Presto, MD  [facility] Pager [phone]  If 7PM-7AM, please contact night-coverage [email or web address] Password [email or web address] 05/27/2013, 7:56 PM

## 2013-05-27 NOTE — ED Provider Notes (Signed)
CSN: [id number]     Arrival date & time 05/27/13  1347 History   First MD Initiated Contact with Patient 05/27/13 1355     Chief Complaint  Patient presents with  . Shortness of Breath   (Consider location/radiation/quality/duration/timing/severity/associated sxs/prior Treatment) HPI  This is a 79 year old female with a history of diabetes, hypertension, atrial fibrillation, and lung cancer who presents with cough. When asked why the she is here, the patient says "I don't know - my son made me come." Per EMS report, flu has been going around the patient's living facility and he was concerned because the patient developed a cough last night. She has been afebrile.  Patient did receive an albuterol treatment in route for wheezing. She denies any chest pain or shortness of breath. She denies myalgias, vomiting, diarrhea.  Past Medical History  Diagnosis Date  . Diabetes mellitus   . Hypertension   . Atrial fibrillation   . Hyperlipidemia   . Peripheral neuropathy   . Gastric AVM   . GERD (gastroesophageal reflux disease)   . Diabetic Charcot foot   . Shortness of breath    Past Surgical History  Procedure Laterality Date  . Tonsillectomy    . Lung removal, partial    . Thyroidectomy, partial    . Hip arthroplasty Left 08/18/2012    Procedure: ARTHROPLASTY BIPOLAR HIP;  Surgeon: Eldred Manges, MD;  Location: [facility];  Service: Orthopedics;  Laterality: Left;   Family History  Problem Relation Age of Onset  . Cancer Mother 51    melanoma  . Aneurysm Father     stomach aneurysm   History  Substance Use Topics  . Smoking status: Former Smoker -- 0.50 packs/day for 10 years    Types: Cigarettes    Quit date: 04/28/2002  . Smokeless tobacco: Not on file  . Alcohol Use: No   OB History   Grav Para Term Preterm Abortions TAB SAB Ect Mult Living                 Review of Systems  Constitutional: Negative for fever.  Respiratory: Positive for cough and wheezing. Negative for  chest tightness and shortness of breath.   Cardiovascular: Negative for chest pain and leg swelling.  Gastrointestinal: Negative for nausea, vomiting and abdominal pain.  Genitourinary: Negative for dysuria.  Musculoskeletal: Negative for back pain.  Skin: Negative for rash.  Neurological: Negative for headaches.  Psychiatric/Behavioral: Negative for confusion.  All other systems reviewed and are negative.    Allergies  Aspirin; Indapamide; Metformin and related; Penicillins; Sulfa antibiotics; and Tiotropium bromide monohydrate  Home Medications   Current Outpatient Rx  Name  Route  Sig  Dispense  Refill  . calcium carbonate (OS-CAL) 600 MG TABS   Oral   Take 600 mg by mouth 2 (two) times daily with a meal.         . Cholecalciferol (VITAMIN D-3) 5000 UNITS TABS   Oral   Take 1 tablet by mouth every morning.          . diltiazem (CARDIZEM) 60 MG tablet   Oral   Take 60 mg by mouth every 8 (eight) hours.         . ENSURE (ENSURE)   Oral   Take 237 mLs by mouth 3 (three) times daily between meals.         . furosemide (LASIX) 20 MG tablet   Oral   Take 20 mg by mouth every morning.         Marland Kitchen  guaiFENesin (MUCINEX) 600 MG 12 hr tablet   Oral   Take 1 tablet (600 mg total) by mouth 2 (two) times daily.   60 tablet   0   . HYDROcodone-acetaminophen (NORCO) 5-325 MG per tablet   Oral   Take 1 tablet by mouth every 6 (six) hours as needed for pain.   20 tablet   0   . mirtazapine (REMERON) 15 MG tablet   Oral   Take 15 mg by mouth at bedtime.         . pantoprazole (PROTONIX) 40 MG tablet   Oral   Take 40 mg by mouth every morning.         . polyethylene glycol powder (GLYCOLAX/MIRALAX) powder   Oral   Take 17 g by mouth every morning.         . potassium chloride (K-DUR) 10 MEQ tablet   Oral   Take 1 tablet (10 mEq total) by mouth daily.   30 tablet   0   . sennosides-docusate sodium (SENOKOT-S) 8.6-50 MG tablet   Oral   Take 1 tablet  by mouth every morning.         . therapeutic multivitamin-minerals (THERAGRAN-M) tablet   Oral   Take 1 tablet by mouth every morning.         . budesonide (PULMICORT) 180 MCG/ACT inhaler   Inhalation   Inhale 1 puff into the lungs 2 (two) times daily.         . traMADol (ULTRAM) 50 MG tablet   Oral   Take 1 tablet (50 mg total) by mouth every 8 (eight) hours as needed for pain. pain   30 tablet   0    BP 156/60  Pulse 112  Temp(Src) 99.2 F (37.3 C) (Oral)  Resp 20  SpO2 100%  LMP 07/29/1960 Physical Exam  Nursing note and vitals reviewed. Constitutional: She is oriented to person, place, and time. No distress.  Elderly, thin  HENT:  Head: Normocephalic and atraumatic.  Eyes: Pupils are equal, round, and reactive to light.  Neck: Neck supple.  Cardiovascular: Normal rate, regular rhythm and normal heart sounds.   Pulmonary/Chest: Effort normal. No respiratory distress. She has wheezes.  Rales LLL  Abdominal: Soft. Bowel sounds are normal. There is no tenderness.  Musculoskeletal: She exhibits no edema.  Neurological: She is alert and oriented to person, place, and time.  Skin: Skin is warm and dry.  Psychiatric: She has a normal mood and affect.    ED Course  Procedures (including critical care time) Labs Review Labs Reviewed  CBC WITH DIFFERENTIAL - Abnormal; Notable for the following:    WBC 14.5 (*)    RBC 3.28 (*)    Hemoglobin 9.8 (*)    HCT 30.0 (*)    Neutro Abs 11.2 (*)    All other components within normal limits  BASIC METABOLIC PANEL - Abnormal; Notable for the following:    Sodium 128 (*)    Glucose, Bld 357 (*)    BUN 38 (*)    Creatinine, Ser 1.47 (*)    GFR calc non Af Amer 30 (*)    GFR calc Af Amer 35 (*)    All other components within normal limits  CULTURE, BLOOD (ROUTINE X 2)  CULTURE, BLOOD (ROUTINE X 2)  POCT I-STAT TROPONIN I   Imaging Review Dg Chest 2 View  05/27/2013   CLINICAL DATA:  Shortness of breath, cough and  wheezing. History of lung carcinoma.  EXAM: CHEST - 2 VIEW  COMPARISON:  08/21/2012  FINDINGS: Underlying chronic lung disease again identified. There may be a subtle infiltrate in the mid left perihilar lung. No overt edema or pleural fluid is identified. There is stable mild cardiomegaly.  IMPRESSION: Potential subtle left perihilar mid lung infiltrate superimposed on significant chronic lung disease.   Electronically Signed   By: Irish Lack M.D.   On: 05/27/2013 15:20    EKG Interpretation    Date/Time:  Saturday May 27 2013 14:10:42 EST Ventricular Rate:  129 PR Interval:    QRS Duration: 74 QT Interval:  311 QTC Calculation: 456 R Axis:   63 Text Interpretation:  Atrial fibrillation Anterior infarct, old ST depression, probably rate related Confirmed by Paizlie Klaus  MD, Sonita Michiels ([id number]) on 05/27/2013 4:02:22 PM           CRITICAL CARE Performed by: Ross Marcus, F   Total critical care time: 35 min  Critical care time was exclusive of separately billable procedures and treating other patients.  Critical care was necessary to treat or prevent imminent or life-threatening deterioration.  Critical care was time spent personally by me on the following activities: development of treatment plan with patient and/or surrogate as well as nursing, discussions with consultants, evaluation of patient's response to treatment, examination of patient, obtaining history from patient or surrogate, ordering and performing treatments and interventions, ordering and review of laboratory studies, ordering and review of radiographic studies, pulse oximetry and re-evaluation of patient's condition.  MDM   1. Community acquired pneumonia   2. Hyponatremia   3. Hyperglycemia   4. Atrial fibrillation   5. COPD exacerbation     Community-acquired pneumonia Hyponatremia Hyperglycemia COPD exacerbation Atrial fibrillation  This is a 79 year old female who presents with cough. She is  given a DuoNeb in route. She's afebrile and satting 100% on room air. She does have evidence of her health and wheezing on exam. Patient was given a duo neb and prednisone given history of COPD. Chest x-ray is concerning for a left middle lobe infiltrate. Lab work is notable for hyponatremia and hyperglycemia. Patient was given a normal saline bolus, ceftriaxone, and azithromycin for community-acquired pneumonia.  On recheck, patient is comfortable on room air. She had improvement of wheezing. Initial EKG showed rate-controlled initial fibrillation at 100; however, patient's heart rate is now in the 120s and 130s. We'll give IV diltiazem.    Shon Baton, MD 05/27/13 [phone]

## 2013-05-27 NOTE — Progress Notes (Signed)
NURSING PROGRESS NOTE  ARABELLA REVELLE [id number] Admitted to 4E: 05/27/2013 Attending Provider: Dorothea Ogle, MD    Lori [name] is a 79 y.o. female patient admitted from ED awake, alert  & orientated  X 4,  Full Code, VSS - Blood pressure 123/65, pulse 110, temperature 98.7 F (37.1 C), temperature source Oral, resp. rate 24, height 5\' 5"  (1.651 m), weight 46.1 kg (101 lb 10.1 oz), last menstrual period 07/29/1960, SpO2 98.00%.RA, no c/o shortness of breath, no c/o chest pain, no distress noted. Tele # 08 placed and pt is currently running ST.   IV site WDL:  Left forearm with a transparent dsg that's clean dry and intact.  Allergies:   Allergies  Allergen Reactions  . Aspirin Other (See Comments)    Unknown   . Indapamide Other (See Comments)    Unknown   . Metformin And Related Other (See Comments)    Unknown   . Penicillins Other (See Comments)    Unknown   . Sulfa Antibiotics Other (See Comments)    Unknown   . Tiotropium Bromide Monohydrate Other (See Comments)    Unknown      Past Medical History  Diagnosis Date  . Diabetes mellitus   . Hypertension   . Atrial fibrillation   . Hyperlipidemia   . Peripheral neuropathy   . Gastric AVM   . GERD (gastroesophageal reflux disease)   . Diabetic Charcot foot   . Shortness of breath     History:  obtained from patient.  Pt orientation to unit, room and routine. Information packet given to patient.  Admission INP armband ID verified with patient, and in place. SR up x 2, fall risk assessment complete with Patient and family verbalizing understanding of risks associated with falls. Pt verbalizes an understanding of how to use the call bell and to call for help before getting out of bed.  Skin, clean-dry- intact without evidence of bruising, or skin tears.   No evidence of skin break down noted on exam.    Will cont to monitor and assist as needed.  Ubaldo Glassing, RN 05/27/2013

## 2013-05-27 NOTE — ED Notes (Signed)
Bed: WA11 Expected date:  Expected time:  Means of arrival:  Comments: EMS 

## 2013-05-27 NOTE — ED Notes (Signed)
Pt's son is POA- number is :[phone]

## 2013-05-27 NOTE — ED Notes (Signed)
I. Izola Price, MD, made aware of patient's elevated temperature via pager.

## 2013-05-27 NOTE — ED Notes (Signed)
Per EMS pt with Hx of a-fib, lung cancer, partial pneumectomy, renal failure came from [facility] for coughing and wheezing. Per EMS, pt's son was worried due to a flu [facility] the [facility]. EMS administered 5 mg albuterol nebulizer treatment en route. Per EMS patient denies pain, is alert and oriented at baseline.

## 2013-05-28 DIAGNOSIS — I5031 Acute diastolic (congestive) heart failure: Secondary | ICD-10-CM

## 2013-05-28 DIAGNOSIS — F0390 Unspecified dementia without behavioral disturbance: Secondary | ICD-10-CM | POA: Diagnosis present

## 2013-05-28 DIAGNOSIS — I4891 Unspecified atrial fibrillation: Secondary | ICD-10-CM

## 2013-05-28 DIAGNOSIS — I509 Heart failure, unspecified: Secondary | ICD-10-CM

## 2013-05-28 LAB — HEMOGLOBIN A1C
Hgb A1c MFr Bld: 7.2 % — ABNORMAL HIGH (ref <5.7)
Mean Plasma Glucose: 160 mg/dL — ABNORMAL HIGH (ref <117)

## 2013-05-28 LAB — EXPECTORATED SPUTUM ASSESSMENT W GRAM STAIN, RFLX TO RESP C: Special Requests: NORMAL

## 2013-05-28 LAB — PRO B NATRIURETIC PEPTIDE: Pro B Natriuretic peptide (BNP): 4043 pg/mL — ABNORMAL HIGH (ref 0–450)

## 2013-05-28 LAB — CBC
HCT: 26.2 % — ABNORMAL LOW (ref 36.0–46.0)
Hemoglobin: 8.7 g/dL — ABNORMAL LOW (ref 12.0–15.0)
MCH: 30.1 pg (ref 26.0–34.0)
Platelets: 207 10*3/uL (ref 150–400)
RBC: 2.89 MIL/uL — ABNORMAL LOW (ref 3.87–5.11)
WBC: 11.7 10*3/uL — ABNORMAL HIGH (ref 4.0–10.5)

## 2013-05-28 LAB — LEGIONELLA ANTIGEN, URINE: Legionella Antigen, Urine: NEGATIVE

## 2013-05-28 LAB — BASIC METABOLIC PANEL
CO2: 22 mEq/L (ref 19–32)
Calcium: 9.2 mg/dL (ref 8.4–10.5)
Chloride: 101 mEq/L (ref 96–112)
Creatinine, Ser: 1.47 mg/dL — ABNORMAL HIGH (ref 0.50–1.10)
Glucose, Bld: 320 mg/dL — ABNORMAL HIGH (ref 70–99)
Potassium: 4.4 mEq/L (ref 3.5–5.1)
Sodium: 132 mEq/L — ABNORMAL LOW (ref 135–145)

## 2013-05-28 LAB — GLUCOSE, CAPILLARY
Glucose-Capillary: 233 mg/dL — ABNORMAL HIGH (ref 70–99)
Glucose-Capillary: 159 mg/dL — ABNORMAL HIGH (ref 70–99)
Glucose-Capillary: 214 mg/dL — ABNORMAL HIGH (ref 70–99)

## 2013-05-28 LAB — INFLUENZA PANEL BY PCR (TYPE A & B)
H1N1 flu by pcr: NOT DETECTED
Influenza B By PCR: NEGATIVE

## 2013-05-28 MED ORDER — SODIUM CHLORIDE 0.9 % IV SOLN
INTRAVENOUS | Status: DC
  Administered 2013-05-28 (×2): via INTRAVENOUS

## 2013-05-28 MED ORDER — FUROSEMIDE 10 MG/ML IJ SOLN
20.0000 mg | INTRAMUSCULAR | Status: AC
  Administered 2013-05-28: 20 mg via INTRAVENOUS

## 2013-05-28 MED ORDER — FUROSEMIDE 10 MG/ML IJ SOLN
INTRAMUSCULAR | Status: AC
  Filled 2013-05-28: qty 2

## 2013-05-28 MED ORDER — FUROSEMIDE 10 MG/ML IJ SOLN
20.0000 mg | Freq: Two times a day (BID) | INTRAMUSCULAR | Status: DC
  Administered 2013-05-28 – 2013-05-29 (×2): 20 mg via INTRAVENOUS
  Filled 2013-05-28 (×4): qty 2

## 2013-05-28 NOTE — Progress Notes (Signed)
[facility] PROGRESS NOTE  Lori [name] ZOX:[id number] DOB: 1920-12-11 DOA: 05/27/2013 PCP: Thayer Headings, MD  Assessment/Plan: Principal Problem:   Community acquired pneumonia: Continue IV antibiotics. Concerning given her dementia for possibility of aspiration pneumonia. Check swallow eval. If she spikes a temperature, broaden antibiotic coverage Active Problems:   Atrial fibrillation with RVR: Some difficulty controlling rate, likely because of volume overload. He should improve as we diurese   COPD exacerbation: On mild steroids plus nebulizers plus oxygen   Acute diastolic CHF (congestive heart failure): BNP elevated at 4000. Echocardiogram done March 2013, normal systolic function but could not comment on diastolic dysfunction   HTN (hypertension): Blood pressure is soft secondary to atrophic fibrillation   GERD (gastroesophageal reflux disease)   Hyponatremia, improved, secondary dehydration, secondary to pneumonia   Leukocytosis improved, secondary to pneumonia   Anemia   Acute renal failure: Monitor closely, especially with plan for diuresis  Code Status: Full code Family Communication: Message left with family.  Disposition Plan: Here for several days   Consultants:  None  Procedures:  None  Antibiotics: IV Rocephin and Zithromax day 2  HPI/Subjective: Patient states she's not doing very good, states breathing is rough  Objective: Filed Vitals:   05/28/13 1417  BP: 109/52  Pulse: 119  Temp: 98.6 F (37 C)  Resp: 24    Intake/Output Summary (Last 24 hours) at 05/28/13 1646 Last data filed at 05/28/13 1623  Gross per 24 hour  Intake 1528.66 ml  Output    550 ml  Net 978.66 ml   Filed Weights   05/27/13 1849 05/28/13 0533  Weight: 46.1 kg (101 lb 10.1 oz) 46.8 kg (103 lb 2.8 oz)    Exam:   General:  Alert and oriented x2, mild distress secondary breathing  Cardiovascular: Irregular rhythm, mild tachycardia  Respiratory: Scattered  rhonchi throughout, mild wheezing bilaterally  Abdomen: Soft, nontender, nondistended, positive bowel sounds  Musculoskeletal: No clubbing or cyanosis, trace edema   Data Reviewed: Basic Metabolic Panel:  Recent Labs Lab 05/27/13 1446 05/27/13 1735 05/28/13 0527  NA 128* 132* 132*  K 4.4 4.8 4.4  CL 96 101 101  CO2 21 23 22   GLUCOSE 357* 278* 320*  BUN 38* 37* 42*  CREATININE 1.47* 1.45* 1.47*  CALCIUM 9.2 9.4 9.2  MG  --  1.7  --   PHOS  --  2.3  --    Liver Function Tests:  Recent Labs Lab 05/27/13 1735  AST 19  ALT 13  ALKPHOS 97  BILITOT 0.4  PROT 7.8  ALBUMIN 3.1*   No results found for this basename: LIPASE, AMYLASE,  in the last 168 hours No results found for this basename: AMMONIA,  in the last 168 hours CBC:  Recent Labs Lab 05/27/13 1446 05/27/13 2013 05/28/13 0527  WBC 14.5* 12.9* 11.7*  NEUTROABS 11.2* 12.4*  --   HGB 9.8* 9.4* 8.7*  HCT 30.0* 28.9* 26.2*  MCV 91.5 91.2 90.7  PLT 220 211 207   Cardiac Enzymes: No results found for this basename: CKTOTAL, CKMB, CKMBINDEX, TROPONINI,  in the last 168 hours BNP (last 3 results)  Recent Labs  05/28/13 0527  PROBNP 4043.0*   CBG:  Recent Labs Lab 05/27/13 2104 05/28/13 0753 05/28/13 1158 05/28/13 1621  GLUCAP 396* 233* 159* 214*    Recent Results (from the past 240 hour(s))  CULTURE, BLOOD (ROUTINE X 2)     Status: None   Collection Time    05/27/13  4:10 PM  Result Value Range Status   Specimen Description BLOOD RIGHT ANTECUBITAL   Final   Special Requests BOTTLES DRAWN AEROBIC ONLY 4CC   Final   Culture  Setup Time     Final   Value: 05/27/2013 20:15     Performed at [facility]   Culture     Final   Value:        BLOOD CULTURE RECEIVED NO GROWTH TO DATE CULTURE WILL BE HELD FOR 5 DAYS BEFORE ISSUING A FINAL NEGATIVE REPORT     Performed at [facility]   Report Status PENDING   Incomplete  CULTURE, BLOOD (ROUTINE X 2)     Status: None   Collection  Time    05/27/13  4:15 PM      Result Value Range Status   Specimen Description BLOOD RIGHT FOREARM   Final   Special Requests     Final   Value: BOTTLES DRAWN AEROBIC AND ANAEROBIC 4CC AEROBIC, 3 CC ANAEROBIC   Culture  Setup Time     Final   Value: 05/27/2013 20:14     Performed at [facility]   Culture     Final   Value:        BLOOD CULTURE RECEIVED NO GROWTH TO DATE CULTURE WILL BE HELD FOR 5 DAYS BEFORE ISSUING A FINAL NEGATIVE REPORT     Performed at [facility]   Report Status PENDING   Incomplete  MRSA PCR SCREENING     Status: None   Collection Time    05/27/13  7:10 PM      Result Value Range Status   MRSA by PCR NEGATIVE  NEGATIVE Final   Comment:            The GeneXpert MRSA Assay (FDA     approved for NASAL specimens     only), is one component of a     comprehensive MRSA colonization     surveillance program. It is not     intended to diagnose MRSA     infection nor to guide or     monitor treatment for     MRSA infections.  CULTURE, EXPECTORATED SPUTUM-ASSESSMENT     Status: None   Collection Time    05/27/13 10:35 PM      Result Value Range Status   Specimen Description SPUTUM   Final   Special Requests NONE   Final   Sputum evaluation     Final   Value: MICROSCOPIC FINDINGS SUGGEST THAT THIS SPECIMEN IS NOT REPRESENTATIVE OF LOWER RESPIRATORY SECRETIONS. PLEASE RECOLLECT.     CALLED CAUGHRON,D/4E @0125  ON 05/28/13 BY KARCZEWSKI,S.   Report Status 05/28/2013 FINAL   Final  CULTURE, EXPECTORATED SPUTUM-ASSESSMENT     Status: None   Collection Time    05/28/13  3:44 AM      Result Value Range Status   Specimen Description SPU   Final   Special Requests Normal   Final   Sputum evaluation     Final   Value: THIS SPECIMEN IS ACCEPTABLE. RESPIRATORY CULTURE REPORT TO FOLLOW.   Report Status 05/28/2013 FINAL   Final     Studies: Dg Chest 2 View  05/27/2013   CLINICAL DATA:  Shortness of breath, cough and wheezing. History of lung  carcinoma.  EXAM: CHEST - 2 VIEW  COMPARISON:  08/21/2012  FINDINGS: Underlying chronic lung disease again identified. There may be a subtle infiltrate in the mid left perihilar lung. No overt  edema or pleural fluid is identified. There is stable mild cardiomegaly.  IMPRESSION: Potential subtle left perihilar mid lung infiltrate superimposed on significant chronic lung disease.   Electronically Signed   By: Irish Lack M.D.   On: 05/27/2013 15:20    Scheduled Meds: . ipratropium  0.5 mg Nebulization Q4H   And  . albuterol  2.5 mg Nebulization Q4H  . azithromycin  500 mg Intravenous Q24H  . calcium carbonate  1 tablet Oral BID WC  . cefTRIAXone (ROCEPHIN)  IV  1 g Intravenous Q24H  . cholecalciferol  5,000 Units Oral q morning - 10a  . diltiazem  60 mg Oral Q8H  . feeding supplement (ENSURE COMPLETE)  237 mL Oral TID BM  . furosemide  20 mg Intravenous Q12H  . guaiFENesin  600 mg Oral BID  . insulin aspart  0-9 Units Subcutaneous TID WC  . mirtazapine  15 mg Oral QHS  . multivitamin with minerals  1 tablet Oral q morning - 10a  . pantoprazole  40 mg Oral q morning - 10a  . polyethylene glycol  17 g Oral Daily  . potassium chloride  10 mEq Oral Daily  . senna-docusate  1 tablet Oral q morning - 10a  . sodium chloride  3 mL Intravenous Q12H   Continuous Infusions:   Principal Problem:   Community acquired pneumonia Active Problems:   Atrial fibrillation with RVR   COPD exacerbation   Acute diastolic CHF (congestive heart failure)   HTN (hypertension)   GERD (gastroesophageal reflux disease)   Hyponatremia   Leukocytosis   Anemia   Acute renal failure    Time spent: 35 minutes    Hollice Espy  [facility] Pager [phone]. If 7PM-7AM, please contact night-coverage at [email or web address], password [facility] 05/28/2013, 4:46 PM  LOS: 1 day

## 2013-05-28 NOTE — Evaluation (Signed)
Physical Therapy Evaluation Patient Details Name: Lori [name] MRN: [id number] DOB: Sep 01, 1920 Today's Date: 05/28/2013 Time: 4098-1191 PT Time Calculation (min): 28 min  PT Assessment / Plan / Recommendation History of Present Illness    This is a 79 year old female with a history of Diabetes type 2 (last HgA1C in 07/2012 was 6.7 at which time she was on Glyburide and glipizide), hypertension, atrial fibrillation who presented to [facility] ED with main concern of progressively worsening shortness of breath that initially stared with exertion several days prior to this admission, and has progressed to dyspnea at rest, associated with non productive cough, subjective fevers, chills, malaise, poor oral intake. Pt is resident of ALF and upon arrival to ED was unable to explain why she came in   Clinical Impression  Pt admitted as above and presenting with functional mobility limitations 2* generalized weakness, limited endurance and mild ambulatory balance deficits.  Pt states she loves living at [facility] - recommend d/c to same facility with follow up HHPT to maximize safety and IND in that setting    PT Assessment  Patient needs continued PT services    Follow Up Recommendations  Home health PT (at ALF )    Does the patient have the potential to tolerate intense rehabilitation      Barriers to Discharge        Equipment Recommendations  None recommended by PT    Recommendations for Other Services     Frequency Min 3X/week    Precautions / Restrictions Precautions Precautions: Fall Restrictions Weight Bearing Restrictions: No   Pertinent Vitals/Pain No c/o pain at this time      Mobility  Bed Mobility Bed Mobility: Supine to Sit;Sit to Supine Supine to Sit: 4: Min guard Sit to Supine: 4: Min guard Transfers Transfers: Sit to Stand;Stand to Sit Sit to Stand: 4: Min assist Stand to Sit: 4: Min assist Details for Transfer Assistance: cues for saftey in transition  position and use of UEs to self assist Ambulation/Gait Ambulation/Gait Assistance: 4: Min assist Ambulation Distance (Feet): 123 Feet Assistive device: Rolling walker Ambulation/Gait Assistance Details: min cues for posture, pacing and position from RW Gait Pattern: Step-through pattern;Shuffle;Trunk flexed;Decreased step length - right;Decreased step length - left    Exercises     PT Diagnosis: Difficulty walking  PT Problem List: Decreased strength;Decreased range of motion;Decreased activity tolerance;Decreased balance;Decreased mobility;Decreased knowledge of use of DME;Decreased safety awareness PT Treatment Interventions: DME instruction;Gait training;Functional mobility training;Therapeutic activities;Therapeutic exercise;Balance training;Patient/family education     PT Goals(Current goals can be found in the care plan section) Acute Rehab PT Goals Patient Stated Goal: Back to [facility] PT Goal Formulation: With patient Time For Goal Achievement: 06/10/13 Potential to Achieve Goals: Good  Visit Information  Last PT Received On: 05/28/13 Assistance Needed: +1       Prior Functioning  Home Living Family/patient expects to be discharged to:: Assisted living Home Equipment: Dan Humphreys - 4 wheels Additional Comments: [facility] (Pt states " I love it there") Prior Function Level of Independence: Independent with assistive device(s) Comments: pt reports using walker Communication Communication: No difficulties    Cognition  Cognition Arousal/Alertness: Awake/alert Behavior During Therapy: WFL for tasks assessed/performed Overall Cognitive Status: History of cognitive impairments - at baseline Memory: Decreased short-term memory    Extremity/Trunk Assessment Upper Extremity Assessment Upper Extremity Assessment: Generalized weakness Lower Extremity Assessment Lower Extremity Assessment: Generalized weakness   Balance    End of Session PT - End of  Session Equipment Utilized During Treatment: Gait belt Activity Tolerance: Patient tolerated treatment well Patient left: in bed;with call bell/phone within reach;with bed alarm set Nurse Communication: Mobility status  GP     Makeya Hilgert 05/28/2013, 4:27 PM

## 2013-05-28 NOTE — Progress Notes (Signed)
Patient says " I am having trouble breathing." Patient appears short of breath and respirations are between 24-28 breaths per minute. Crackles heard throughout all lung fields. Oxygen saturation is 92% on room air.  Doctor Rito Ehrlich notified.  New orders received to give patient a second dose of lasix and apply oxygen at 2 liters nasal canula.  After applying oxygen, patient's oxygen saturation is 100% on 2 liters nasal canula.  Will continue to monitor patient.

## 2013-05-29 ENCOUNTER — Inpatient Hospital Stay (HOSPITAL_COMMUNITY): Payer: Medicare Other

## 2013-05-29 DIAGNOSIS — J690 Pneumonitis due to inhalation of food and vomit: Principal | ICD-10-CM

## 2013-05-29 DIAGNOSIS — J9600 Acute respiratory failure, unspecified whether with hypoxia or hypercapnia: Secondary | ICD-10-CM | POA: Diagnosis present

## 2013-05-29 LAB — CBC
MCHC: 33.1 g/dL (ref 30.0–36.0)
MCV: 91.1 fL (ref 78.0–100.0)
Platelets: 230 10*3/uL (ref 150–400)
RDW: 13.5 % (ref 11.5–15.5)
WBC: 12.6 10*3/uL — ABNORMAL HIGH (ref 4.0–10.5)

## 2013-05-29 LAB — BASIC METABOLIC PANEL
BUN: 46 mg/dL — ABNORMAL HIGH (ref 6–23)
Calcium: 9.3 mg/dL (ref 8.4–10.5)
Creatinine, Ser: 1.47 mg/dL — ABNORMAL HIGH (ref 0.50–1.10)
GFR calc Af Amer: 35 mL/min — ABNORMAL LOW (ref >90)
Sodium: 133 mEq/L — ABNORMAL LOW (ref 135–145)

## 2013-05-29 LAB — URINE CULTURE
Colony Count: NO GROWTH
Culture: NO GROWTH

## 2013-05-29 LAB — GLUCOSE, CAPILLARY
Glucose-Capillary: 192 mg/dL — ABNORMAL HIGH (ref 70–99)
Glucose-Capillary: 169 mg/dL — ABNORMAL HIGH (ref 70–99)
Glucose-Capillary: 291 mg/dL — ABNORMAL HIGH (ref 70–99)

## 2013-05-29 MED ORDER — IPRATROPIUM-ALBUTEROL 0.5-2.5 (3) MG/3ML IN SOLN
3.0000 mL | RESPIRATORY_TRACT | Status: DC
  Administered 2013-05-29 – 2013-05-30 (×2): 3 mL via RESPIRATORY_TRACT
  Filled 2013-05-29: qty 3

## 2013-05-29 MED ORDER — FUROSEMIDE 10 MG/ML IJ SOLN
20.0000 mg | Freq: Once | INTRAMUSCULAR | Status: AC
  Administered 2013-05-29: 20 mg via INTRAVENOUS

## 2013-05-29 MED ORDER — CEFTRIAXONE SODIUM 1 G IJ SOLR
1.0000 g | INTRAMUSCULAR | Status: DC
  Administered 2013-05-29 – 2013-05-30 (×2): 1 g via INTRAVENOUS
  Filled 2013-05-29 (×3): qty 10

## 2013-05-29 MED ORDER — METRONIDAZOLE IN NACL 5-0.79 MG/ML-% IV SOLN
500.0000 mg | Freq: Three times a day (TID) | INTRAVENOUS | Status: DC
  Administered 2013-05-29 – 2013-05-30 (×3): 500 mg via INTRAVENOUS
  Filled 2013-05-29 (×6): qty 100

## 2013-05-29 MED ORDER — DILTIAZEM HCL ER 60 MG PO CP12
120.0000 mg | ORAL_CAPSULE | Freq: Two times a day (BID) | ORAL | Status: DC
  Administered 2013-05-29 – 2013-05-30 (×3): 120 mg via ORAL
  Filled 2013-05-29 (×4): qty 2

## 2013-05-29 MED ORDER — VANCOMYCIN HCL 500 MG IV SOLR
500.0000 mg | INTRAVENOUS | Status: DC
  Administered 2013-05-29: 500 mg via INTRAVENOUS
  Filled 2013-05-29 (×2): qty 500

## 2013-05-29 MED ORDER — DILTIAZEM LOAD VIA INFUSION
10.0000 mg | Freq: Once | INTRAVENOUS | Status: AC
  Administered 2013-05-29: 10 mg via INTRAVENOUS
  Filled 2013-05-29: qty 10

## 2013-05-29 MED ORDER — DILTIAZEM HCL 100 MG IV SOLR
5.0000 mg/h | INTRAVENOUS | Status: DC
  Administered 2013-05-29: 5 mg/h via INTRAVENOUS
  Filled 2013-05-29: qty 100

## 2013-05-29 MED ORDER — IPRATROPIUM-ALBUTEROL 0.5-2.5 (3) MG/3ML IN SOLN: 3.0000 mL | RESPIRATORY_TRACT | Status: DC

## 2013-05-29 MED ORDER — FUROSEMIDE 10 MG/ML IJ SOLN
40.0000 mg | Freq: Two times a day (BID) | INTRAMUSCULAR | Status: DC
  Administered 2013-05-29 – 2013-05-30 (×2): 40 mg via INTRAVENOUS
  Filled 2013-05-29 (×4): qty 4

## 2013-05-29 NOTE — Progress Notes (Signed)
Pt with increased work of breathing and a-fib RVR. Ekg being done. Lenny Pastel NP came to room and pt to get bipap and transferred to 1230. Pt received 6am lasix and is diuresing clear yellow urine. PCXR done once arrived 1230. Her son Bethann Berkshire was called and updated. Pt gradually less sympotmatic and v/s are stable with 02 at 2 L N.C. Carizem started IV.

## 2013-05-29 NOTE — Progress Notes (Signed)
[facility] PROGRESS NOTE  Lori [name] ZOX:[id number] DOB: 08-02-1920 DOA: 05/27/2013 PCP: Thayer Headings, MD  Assessment/Plan: Principal Problem: Acute respiratory failure: Multifactorial secondary to mild COPD, aspiration pneumonia and acute diastolic heart failure, likely made worse by atrial fibrillation. Overall improving.  Aspiration pneumonia: Change antibiotics for broader coverage. Concerning given her dementia for possibility of aspiration pneumonia. Now n.p.o. except for pills. Speech therapy to check swallow evaluation.  Active Problems:   Atrial fibrillation with RVR: Some difficulty controlling rate, likely because of volume overload. On Cardizem drip. Working to wean. Heart rate should improve as oxygenation is better and we diurese.     COPD exacerbation:  nebulizers plus oxygen. As above. Oxygenation better.   Acute diastolic CHF (congestive heart failure): BNP elevated at 4000. Echocardiogram done March 2013, normal systolic function but could not comment on diastolic dysfunction: Starting Lasix yesterday, patient diuresed over 2 L. Will continue and recheck BNP tomorrow.    HTN (hypertension): Blood pressure stable. Down earlier secondary to atrial fibrillation, increased with rate control. Should improve as we diurese    GERD (gastroesophageal reflux disease): Stable.    Hyponatremia, slowly improved, secondary dehydration, secondary to pneumonia    Leukocytosis mildly increased today. Also noted low-grade temps this morning. Discontinuing Zithromax and Rocephin in exchange for more broad-spectrum antibiotics to cover for aspiration pneumonia.    Anemia: Likely secondary to chronic disease. Actually improved from yesterday.    Acute renal failure: Monitor closely, especially with plan for diuresis, unchanged from yesterday  Code Status: Full code Family Communication: Spoke with patient's son by phone  Disposition Plan: Marked improvement with diuresis &  n.p.o. status. Will wean her Cardizem. Likely can transfer to telemetry bed later today.  Consultants:  None  Procedures:  None  Antibiotics: IV Rocephin and Zithromax 12/27-12/29 IV vancomycin 12/29-present IV Zosyn 12/29-present   HPI/Subjective: Patient states breathing a little better. Tired. Complains of shortness of breath.  Objective: Filed Vitals:   05/29/13 0800  BP: 142/63  Pulse: 109  Temp: 100.2 F (37.9 C)  Resp: 30    Intake/Output Summary (Last 24 hours) at 05/29/13 0927 Last data filed at 05/29/13 0800  Gross per 24 hour  Intake 1391.5 ml  Output   2250 ml  Net -858.5 ml   Filed Weights   05/28/13 0533 05/29/13 0501 05/29/13 0534  Weight: 46.8 kg (103 lb 2.8 oz) 48.2 kg (106 lb 4.2 oz) 48 kg (105 lb 13.1 oz)    Exam:   General:  Patient somewhat, however alert and oriented when woken. Feeling better.  Cardiovascular: Irregular rhythm, mild tachycardia  Respiratory: Scattered rhonchi throughout, although wheezing is better  Abdomen: Soft, nontender, nondistended, positive bowel sounds  Musculoskeletal: No clubbing or cyanosis, trace edema   Data Reviewed: Basic Metabolic Panel:  Recent Labs Lab 05/27/13 1446 05/27/13 1735 05/28/13 0527 05/29/13 0623  NA 128* 132* 132* 133*  K 4.4 4.8 4.4 4.6  CL 96 101 101 97  CO2 21 23 22 26   GLUCOSE 357* 278* 320* 216*  BUN 38* 37* 42* 46*  CREATININE 1.47* 1.45* 1.47* 1.47*  CALCIUM 9.2 9.4 9.2 9.3  MG  --  1.7  --   --   PHOS  --  2.3  --   --    Liver Function Tests:  Recent Labs Lab 05/27/13 1735  AST 19  ALT 13  ALKPHOS 97  BILITOT 0.4  PROT 7.8  ALBUMIN 3.1*   No results found for this basename: LIPASE,  AMYLASE,  in the last 168 hours No results found for this basename: AMMONIA,  in the last 168 hours CBC:  Recent Labs Lab 05/27/13 1446 05/27/13 2013 05/28/13 0527 05/29/13 0623  WBC 14.5* 12.9* 11.7* 12.6*  NEUTROABS 11.2* 12.4*  --   --   HGB 9.8* 9.4* 8.7* 9.5*   HCT 30.0* 28.9* 26.2* 28.7*  MCV 91.5 91.2 90.7 91.1  PLT 220 211 207 230   Cardiac Enzymes: No results found for this basename: CKTOTAL, CKMB, CKMBINDEX, TROPONINI,  in the last 168 hours BNP (last 3 results)  Recent Labs  05/28/13 0527  PROBNP 4043.0*   CBG:  Recent Labs Lab 05/28/13 0753 05/28/13 1158 05/28/13 1621 05/28/13 2119 05/29/13 0810  GLUCAP 233* 159* 214* 192* 192*    Recent Results (from the past 240 hour(s))  CULTURE, BLOOD (ROUTINE X 2)     Status: None   Collection Time    05/27/13  4:10 PM      Result Value Range Status   Specimen Description BLOOD RIGHT ANTECUBITAL   Final   Special Requests BOTTLES DRAWN AEROBIC ONLY 4CC   Final   Culture  Setup Time     Final   Value: 05/27/2013 20:15     Performed at [facility]   Culture     Final   Value:        BLOOD CULTURE RECEIVED NO GROWTH TO DATE CULTURE WILL BE HELD FOR 5 DAYS BEFORE ISSUING A FINAL NEGATIVE REPORT     Performed at [facility]   Report Status PENDING   Incomplete  CULTURE, BLOOD (ROUTINE X 2)     Status: None   Collection Time    05/27/13  4:15 PM      Result Value Range Status   Specimen Description BLOOD RIGHT FOREARM   Final   Special Requests     Final   Value: BOTTLES DRAWN AEROBIC AND ANAEROBIC 4CC AEROBIC, 3 CC ANAEROBIC   Culture  Setup Time     Final   Value: 05/27/2013 20:14     Performed at [facility]   Culture     Final   Value:        BLOOD CULTURE RECEIVED NO GROWTH TO DATE CULTURE WILL BE HELD FOR 5 DAYS BEFORE ISSUING A FINAL NEGATIVE REPORT     Performed at [facility]   Report Status PENDING   Incomplete  URINE CULTURE     Status: None   Collection Time    05/27/13  7:10 PM      Result Value Range Status   Specimen Description URINE, RANDOM   Final   Special Requests NONE   Final   Culture  Setup Time     Final   Value: 05/28/2013 03:33     Performed at [facility] Count     Final   Value: NO GROWTH      Performed at [facility]   Culture     Final   Value: NO GROWTH     Performed at [facility]   Report Status 05/29/2013 FINAL   Final  MRSA PCR SCREENING     Status: None   Collection Time    05/27/13  7:10 PM      Result Value Range Status   MRSA by PCR NEGATIVE  NEGATIVE Final   Comment:            The GeneXpert  MRSA Assay (FDA     approved for NASAL specimens     only), is one component of a     comprehensive MRSA colonization     surveillance program. It is not     intended to diagnose MRSA     infection nor to guide or     monitor treatment for     MRSA infections.  CULTURE, EXPECTORATED SPUTUM-ASSESSMENT     Status: None   Collection Time    05/27/13 10:35 PM      Result Value Range Status   Specimen Description SPUTUM   Final   Special Requests NONE   Final   Sputum evaluation     Final   Value: MICROSCOPIC FINDINGS SUGGEST THAT THIS SPECIMEN IS NOT REPRESENTATIVE OF LOWER RESPIRATORY SECRETIONS. PLEASE RECOLLECT.     CALLED CAUGHRON,D/4E @0125  ON 05/28/13 BY KARCZEWSKI,S.   Report Status 05/28/2013 FINAL   Final  CULTURE, EXPECTORATED SPUTUM-ASSESSMENT     Status: None   Collection Time    05/28/13  3:44 AM      Result Value Range Status   Specimen Description SPU   Final   Special Requests Normal   Final   Sputum evaluation     Final   Value: THIS SPECIMEN IS ACCEPTABLE. RESPIRATORY CULTURE REPORT TO FOLLOW.   Report Status 05/28/2013 FINAL   Final     Studies: Dg Chest 1 View  05/29/2013   CLINICAL DATA:  Shortness of breath  EXAM: CHEST - 1 VIEW  COMPARISON:  05/27/2013  FINDINGS: Borderline heart size and pulmonary vascularity. Peribronchial thickening and perihilar streaky opacities consistent with chronic bronchitic change. Interstitial fibrosis in the lung bases. Patchy airspace disease in the left lung and right lung base appear to be increasing since previous study. Linear shadow along the right lateral chest probably represents soft  tissue fold. No definite pneumothorax. No blunting of costophrenic angles. Calcification of the aorta. Degenerative changes in the shoulders.  IMPRESSION: Patchy infiltrates in the left mid and right lower lung zones are increasing since previous study. Developing pneumonia should be considered. Stable chronic changes in the chest.   Electronically Signed   By: Burman Nieves M.D.   On: 05/29/2013 05:56   Dg Chest 2 View  05/27/2013   CLINICAL DATA:  Shortness of breath, cough and wheezing. History of lung carcinoma.  EXAM: CHEST - 2 VIEW  COMPARISON:  08/21/2012  FINDINGS: Underlying chronic lung disease again identified. There may be a subtle infiltrate in the mid left perihilar lung. No overt edema or pleural fluid is identified. There is stable mild cardiomegaly.  IMPRESSION: Potential subtle left perihilar mid lung infiltrate superimposed on significant chronic lung disease.   Electronically Signed   By: Irish Lack M.D.   On: 05/27/2013 15:20    Scheduled Meds: . ipratropium  0.5 mg Nebulization Q4H   And  . albuterol  2.5 mg Nebulization Q4H  . azithromycin  500 mg Intravenous Q24H  . calcium carbonate  1 tablet Oral BID WC  . cefTRIAXone (ROCEPHIN)  IV  1 g Intravenous Q24H  . cholecalciferol  5,000 Units Oral q morning - 10a  . feeding supplement (ENSURE COMPLETE)  237 mL Oral TID BM  . furosemide  20 mg Intravenous Once  . furosemide  40 mg Intravenous Q12H  . guaiFENesin  600 mg Oral BID  . insulin aspart  0-9 Units Subcutaneous TID WC  . mirtazapine  15 mg Oral QHS  . multivitamin with  minerals  1 tablet Oral q morning - 10a  . pantoprazole  40 mg Oral q morning - 10a  . polyethylene glycol  17 g Oral Daily  . potassium chloride  10 mEq Oral Daily  . senna-docusate  1 tablet Oral q morning - 10a  . sodium chloride  3 mL Intravenous Q12H   Continuous Infusions: . diltiazem (CARDIZEM) infusion 5 mg/hr (05/29/13 0549)    Principal Problem:   Community acquired  pneumonia Active Problems:   Atrial fibrillation with RVR   COPD exacerbation   Acute diastolic CHF (congestive heart failure)   HTN (hypertension)   GERD (gastroesophageal reflux disease)   Hyponatremia   Leukocytosis   Anemia   Acute renal failure    Time spent: 40 minutes    Hollice Espy  [facility] Pager [phone]. If 7PM-7AM, please contact night-coverage at [email or web address], password [facility] 05/29/2013, 9:27 AM  LOS: 2 days

## 2013-05-29 NOTE — Progress Notes (Signed)
ANTIBIOTIC CONSULT NOTE   Pharmacy Consult for Vancomycin, Ceftriaxone, Flagyl Indication: Aspiration pneumonia  Allergies  Allergen Reactions  . Aspirin Other (See Comments)    Unknown   . Indapamide Other (See Comments)    Unknown   . Metformin And Related Other (See Comments)    Unknown   . Penicillins Other (See Comments)    Unknown   . Sulfa Antibiotics Other (See Comments)    Unknown   . Tiotropium Bromide Monohydrate Other (See Comments)    Unknown     Patient Measurements: Height: 5\' 5"  (165.1 cm) Weight: 105 lb 13.1 oz (48 kg) IBW/kg (Calculated) : 57  Vital Signs: Temp: 100.2 F (37.9 C) (12/29 0800) Temp src: Oral (12/29 0800) BP: 142/63 mmHg (12/29 0800) Pulse Rate: 109 (12/29 0800) Intake/Output from previous day: 12/28 0701 - 12/29 0700 In: 1341.5 [P.O.:440; I.V.:581.5; IV Piggyback:300] Out: 2250 [Urine:2250]  Labs:  Recent Labs  05/27/13 1735 05/27/13 2013 05/28/13 0527 05/29/13 0623  WBC  --  12.9* 11.7* 12.6*  HGB  --  9.4* 8.7* 9.5*  PLT  --  211 207 230  CREATININE 1.45*  --  1.47* 1.47*   Estimated Creatinine Clearance: 19.3 ml/min (by C-G formula based on Cr of 1.47).   Microbiology: 12/27 MRSA PCR: negative 12/27 Influenza PCR: negative  12/27 Urine strep and legionella Ag: Both negative 12/27 blood: ngtd 12/27 urine: NGF 12/28 sputum: no growth  Anti-infectives: 12/27 >> Azith >> 12/29 12/27 >> Tamiflu >> 12/28 12/27 >> Ceftriax >>  12/29 >> Vanc >> 12/29 >> Flagyl >>  Assessment: 89 yoF admitted on 12/27 with SOB, concern for CAP, COPD, Afib with RVR.  Now with concern for aspiration pneumonia, antibiotics are broadened.  Pharmacy is consulted to dose IV vancomycin, ceftriaxone, and flagyl.   Day # 3 azithromycin and ceftriaxone.  Tamiflu initially ordered, but d/c after Influenza negative  Note unknown Penicillin allergy, but pt has tolerated  2 doses of ceftriaxone this admission.  Tmax: 100.2  WBCs:  12.6  Renal: SCr 1.47, CrCl ~ 19   Goal of Therapy:  Vancomycin trough level 15-20 mcg/ml Appropriate abx dosing, eradication of infection.   Plan:   Continue ceftriaxone 1g IV q24h  Flagyl 500mg  IV q8h  Vancomycin 500mg  IV q48h.  Measure Vanc trough at steady state.  Follow up renal fxn and culture results.  Lynann Beaver PharmD, BCPS Pager [phone] 05/29/2013 12:37 PM

## 2013-05-29 NOTE — Clinical Documentation Improvement (Signed)
05/29/13: per NP progr note.Marland KitchenMarland Kitchen"Pneumonia with respiratory distress. Ordered the patient transferred to step down for closer monitoring. I have ordered BiPAP as I am concerned the patient will tire out given her use of accessory respiratory muscles. We'll obtain a chest x-ray and follow for results."..."alert and in no obvious distress but is using accessory respiratory muscles in breathing. Her O2 sats remained stable with nasal cannula oxygen. Her pulse rate in the 120-140 range.  Vital signs. Temperature 99.7, pulse 142, respiration 26, blood pressure 146/87. O2 sats on nasal cannula oxygen 97%." For accurate Dx specificity & severity can noted " resp distress" be further clarified with probable, likely, suspected, possible cond being eval'd, mon'd & tx'd. Thank you   Possible Clinical Conditions? Acute Respiratory Failure Acute on Chronic Respiratory Failure Chronic Respiratory Failure Other Condition Cannot Clinically Determine   Supporting Information: Risk Factors: See Above note Signs & Symptoms: See Above note Diagnostics: See Above note Treatment: See Above note  Thank You, Toribio Harbour, RN, BSN, CCDS Certified Clinical Documentation Specialist Pager: [phone] [place]- Health Information ManagementPossible Clinical Conditions?

## 2013-05-29 NOTE — Progress Notes (Signed)
Pt awake & states I can't breath.. Resp called to do neb tx & IV Lasix 49m g dose for 6am was given early. Coughing up thick creamy tannish to green, & very dark brown sputum, T. Callahan,NP, & Rapid Response called & EKG called.

## 2013-05-29 NOTE — Progress Notes (Signed)
[facility] progress note. Chief complaint. Dyspnea, tachycardia. History of present illness. This 79 year old female in hospital with community-acquired pneumonia versus aspiration pneumonia. She has a known history of atrial fib, COPD, CHF. Nursing staff noted the patient's respiratory status had declined finding her dyspneic with crackles bilaterally. She was given a dose of Lasix and I came to see the patient at bedside along with rapid response. 12-lead EKG was obtained confirming atrial fib with RVR. The patient herself is alert and in no obvious distress but is using accessory respiratory muscles in breathing. Her O2 sats remained stable with nasal cannula oxygen. Her pulse rate in the 120-140 range. Vital signs. Temperature 99.7, pulse 142, respiration 26, blood pressure 146/87. O2 sats on nasal cannula oxygen 97%. General appearance. Frail elderly female who is alert, cooperative, and in no distress. Cardiac. Tachycardic and irregular. No jugular venous distention or significant edema. Lungs. Course rales and rhonchi throughout lung fields but worse in the bases bilaterally. Mild upper airway wheezing. Some use of accessory respiratory muscles. Abdomen. Soft with positive bowel sounds. No pain. Impression/plan. Problem #1. Pneumonia with respiratory distress. Ordered the patient transferred to step down for closer monitoring. I have ordered BiPAP as I am concerned the patient will tire out given her use of accessory respiratory muscles. We'll obtain a chest x-ray and follow for results. Patient does seem somewhat better status post Lasix and we'll continue to monitor her diuresis. Problem #2. Atrial fib with RVR. Patient really unable to take oral Cardizem at this point. I've discontinued the oral Cardizem and ordered a Cardizem drip following a 10 mg Cardizem bolus. Staff will notify me if we don't achieve rate control with above.

## 2013-05-29 NOTE — Progress Notes (Signed)
[id number], RN, BSN, CCM [phone] Chart Reviewed for discharge and hospital needs. Discharge needs at time of review:  None present will follow for needs. Review of patient progress due on [id number]. 

## 2013-05-29 NOTE — Evaluation (Signed)
Clinical/Bedside Swallow Evaluation Patient Details  Name: CORINTHIAN KEMLER MRN: [id number] Date of Birth: 01-01-1921  Today's Date: 05/29/2013 Time: 1120-1138 SLP Time Calculation (min): 18 min  Past Medical History:  Past Medical History  Diagnosis Date  . Diabetes mellitus   . Hypertension   . Atrial fibrillation   . Hyperlipidemia   . Peripheral neuropathy   . Gastric AVM   . GERD (gastroesophageal reflux disease)   . Diabetic Charcot foot   . Shortness of breath    Past Surgical History:  Past Surgical History  Procedure Laterality Date  . Tonsillectomy    . Lung removal, partial    . Thyroidectomy, partial    . Hip arthroplasty Left 08/18/2012    Procedure: ARTHROPLASTY BIPOLAR HIP;  Surgeon: Eldred Manges, MD;  Location: [facility];  Service: Orthopedics;  Laterality: Left;   HPI:  79 year old female admitted with acute respiratory failure, multifactorial in nature due to mild COPD,  suspected aspiration PNa, and acute diastolic heart failure made worse by atrial fibrillation. Developed worsening respiratory function on 12/29 am requiring transfer to ICU. Has now stabilized. Patient does not endorse any h/o difficulty swallowing.    Assessment / Plan / Recommendation Clinical Impression  Patient presents with a functional appearing oropharyngeal swallow however given advanced age, h/o COPD, and acute diagnosis of aspiration PNA, recommend proceeding with objective evaluation of swallowing via MBS to ensure safest diet with lowest aspiration risk is established. Will proceed with MBS this pm.     Aspiration Risk  Mild    Diet Recommendation NPO   Medication Administration: Via alternative means    Other  Recommendations Recommended Consults: MBS Oral Care Recommendations: Oral care Q4 per protocol   Follow Up Recommendations   (TBD)    Frequency and Duration        Pertinent Vitals/Pain n/a        Swallow Study    General HPI: 79 year old female admitted with  acute respiratory failure, multifactorial in nature due to mild COPD,  suspected aspiration PNa, and acute diastolic heart failure made worse by atrial fibrillation. Developed worsening respiratory function on 12/29 am requiring transfer to ICU. Has now stabilized. Patient does not endorse any h/o difficulty swallowing.  Type of Study: Bedside swallow evaluation Previous Swallow Assessment: none Diet Prior to this Study: NPO Temperature Spikes Noted: No Respiratory Status: Nasal cannula History of Recent Intubation: No Behavior/Cognition: Alert;Cooperative;Pleasant mood Oral Cavity - Dentition: Adequate natural dentition Self-Feeding Abilities: Able to feed self Patient Positioning: Upright in bed Baseline Vocal Quality: Clear Volitional Cough: Congested;Strong Volitional Swallow: Able to elicit    Oral/Motor/Sensory Function Overall Oral Motor/Sensory Function: Appears within functional limits for tasks assessed   Ice Chips Ice chips: Not tested   Thin Liquid Thin Liquid: Within functional limits Presentation: Cup;Self Fed;Straw    Nectar Thick Nectar Thick Liquid: Not tested   Honey Thick Honey Thick Liquid: Not tested   Puree Puree: Within functional limits Presentation: Self Fed;Spoon   Solid   GO   Shatina Streets MA, CCC-SLP [phone]  Solid: Within functional limits Presentation: Self Fed       Rashee Marschall Meryl 05/29/2013,11:41 AM

## 2013-05-29 NOTE — Evaluation (Signed)
Occupational Therapy Evaluation Patient Details Name: Lori [name] MRN: [id number] DOB: [birth year] Today's Date: 05/29/2013 Time: 2440-1027 OT Time Calculation (min): 18 min  OT Assessment / Plan / Recommendation History of present illness Pt was admitted with CAP   Clinical Impression   Pt was admitted for the above.  She is a resident of an ALF--pt reports mod I with adls but she does have decreased short term memory--unsure if this is correct.  She currently needs min A for adls for sit to stand and balance.  She will benefit from skilled OT to increase safety and independence with adls with overall supervision level goals.      OT Assessment  Patient needs continued OT Services    Follow Up Recommendations  Home health OT;SNF (needs min A for mobility; adls at ALF--if they cannot provide this then SNF)    Barriers to Discharge      Equipment Recommendations  None recommended by OT    Recommendations for Other Services    Frequency  Min 2X/week    Precautions / Restrictions Precautions Precautions: Fall Restrictions Weight Bearing Restrictions: No   Pertinent Vitals/Pain No pain reported.  HR low 100s    ADL  Eating/Feeding: Supervision/safety Where Assessed - Eating/Feeding: Chair Grooming: Set up Where Assessed - Grooming: Supported sitting Upper Body Bathing: Set up Where Assessed - Upper Body Bathing: Supported sitting Lower Body Bathing: Minimal assistance Where Assessed - Lower Body Bathing: Supported sit to stand Upper Body Dressing: Set up Where Assessed - Upper Body Dressing: Supported sitting Lower Body Dressing: Minimal assistance Where Assessed - Lower Body Dressing: Supported sit to Pharmacist, hospital: Mining engineer Method: Sit to stand Toileting - Architect and Hygiene: Minimal assistance Where Assessed - Engineer, mining and Hygiene: Sit to stand from 3-in-1 or toilet Equipment Used:  Rolling walker Transfers/Ambulation Related to ADLs: transferred from bed to 3:1 commode ADL Comments: Pt cued with ensure to tuck chin.  Overall min A for adls for balance and sit to stand    OT Diagnosis: Generalized weakness  OT Problem List: Decreased strength;Decreased activity tolerance;Impaired balance (sitting and/or standing);Decreased cognition OT Treatment Interventions: Self-care/ADL training;DME and/or AE instruction;Patient/family education;Balance training   OT Goals(Current goals can be found in the care plan section) Acute Rehab OT Goals Patient Stated Goal: [facility] OT Goal Formulation: With patient Time For Goal Achievement: 06/12/13 Potential to Achieve Goals: Good ADL Goals Pt Will Perform Lower Body Bathing: with supervision;sit to/from stand Pt Will Perform Lower Body Dressing: with supervision;sit to/from stand Pt Will Transfer to Toilet: with supervision;ambulating;bedside commode Pt Will Perform Toileting - Clothing Manipulation and hygiene: with supervision;sit to/from stand Pt Will Perform Tub/Shower Transfer: with min guard assist;ambulating;shower seat Additional ADL Goal #1: pt will perform bed mobility at supervision level in preparation for adls/toilet transfers  Visit Information  Last OT Received On: 05/29/13 Assistance Needed: +1 History of Present Illness: Pt was admitted with CAP       Prior Functioning     Home Living Family/patient expects to be discharged to:: Assisted living Additional Comments: [facility] (Pt states " I love it there")  Pt states she has a grab bar by toilet and walk in shower with seat Prior Function Level of Independence: Independent with assistive device(s) Comments: pt reports using walker Communication Communication: No difficulties         Vision/Perception     Cognition  Cognition Arousal/Alertness: Awake/alert Behavior During Therapy: [facility] for  tasks assessed/performed Overall  Cognitive Status: History of cognitive impairments - at baseline Memory: Decreased short-term memory    Extremity/Trunk Assessment Upper Extremity Assessment Upper Extremity Assessment: Overall WFL for tasks assessed     Mobility Bed Mobility Bed Mobility: Supine to Sit Supine to Sit: 4: Min guard Details for Bed Mobility Assistance: high bed; min guard for safety Transfers Sit to Stand: 4: Min assist Stand to Sit: 4: Min guard Details for Transfer Assistance: cues for hand placement with RW     Exercise     Balance     End of Session OT - End of Session Activity Tolerance: Patient tolerated treatment well Patient left: in chair;with call bell/phone within reach Nurse Communication: Mobility status  GO     Lori [name] 05/29/2013, 3:01 PM Marica Otter, OTR/L [phone] 05/29/2013

## 2013-05-29 NOTE — Procedures (Addendum)
Objective Swallowing Evaluation: Modified Barium Swallowing Study  Patient Details  Name: Lori [name] MRN: [id number] Date of Birth: [birth year]  Today's Date: 05/29/2013 Time: 1325-1340  SLP Time Calculation (min): 20 min  Past Medical History:  Past Medical History  Diagnosis Date  . Diabetes mellitus   . Hypertension   . Atrial fibrillation   . Hyperlipidemia   . Peripheral neuropathy   . Gastric AVM   . GERD (gastroesophageal reflux disease)   . Diabetic Charcot foot   . Shortness of breath    Past Surgical History:  Past Surgical History  Procedure Laterality Date  . Tonsillectomy    . Lung removal, partial    . Thyroidectomy, partial    . Hip arthroplasty Left 08/18/2012    Procedure: ARTHROPLASTY BIPOLAR HIP;  Surgeon: Eldred Manges, MD;  Location: [facility];  Service: Orthopedics;  Laterality: Left;   HPI:  79 year old female admitted with acute respiratory failure, multifactorial in nature due to mild COPD,  suspected aspiration PNa, and acute diastolic heart failure made worse by atrial fibrillation. Developed worsening respiratory function on 12/29 am requiring transfer to ICU. Has now stabilized. Patient does not endorse any h/o difficulty swallowing.      Assessment / Plan / Recommendation Clinical Impression  Dysphagia Diagnosis: Mild pharyngeal phase dysphagia Clinical impression: Patient presents with a mild oro-pharyngeal phase dysphagia with primary origin suspected to be respiratory based in nature. Base of tongue weakness and decreased coordination of breath and swallow results in a delayed swallow initiation and leads to silent penetration and trace aspiration of thin liquids, prevented with SLP visual and tactile cueing for a chin tuck. Trace residuals s/p swallow due to base of tongue weakness are cleared with combination of spontaneous and cued dry swallows. Recommend patient advance to a dysphagia 3 (for energy conservation and pharyngeal clearance) diet with  thin liquids. Aspiration risk moderately high but can be decreased with use of compensatory strategies. SLP will f/u for diet tolerance and ability to advance diet/lift precautions. Given advanced age, suspect some degree of baseline dysphagia which may now be exacerbated by acute condition.     Treatment Recommendation  Therapy as outlined in treatment plan below    Diet Recommendation NPO   Liquid Administration via: Cup;Straw (straw ok to facilitate use of chin tuck) Medication Administration: Via alternative means Supervision: Patient able to self feed;Full supervision/cueing for compensatory strategies Compensations: Slow rate;Small sips/bites Postural Changes and/or Swallow Maneuvers: Seated upright 90 degrees;Chin tuck (ok to use straws to facilitate chin tuck)    Other  Recommendations Recommended Consults: MBS Oral Care Recommendations: Oral care Q4 per protocol   Follow Up Recommendations   (TBD)    Frequency and Duration min 2x/week  2 weeks        General HPI: 79 year old female admitted with acute respiratory failure, multifactorial in nature due to mild COPD,  suspected aspiration PNa, and acute diastolic heart failure made worse by atrial fibrillation. Developed worsening respiratory function on 12/29 am requiring transfer to ICU. Has now stabilized. Patient does not endorse any h/o difficulty swallowing.  Type of Study: Modified Barium Swallowing Study Reason for Referral: Objectively evaluate swallowing function Previous Swallow Assessment: none Diet Prior to this Study: NPO Temperature Spikes Noted: No Respiratory Status: Nasal cannula History of Recent Intubation: No Behavior/Cognition: Alert;Cooperative;Pleasant mood Oral Cavity - Dentition: Adequate natural dentition Oral Motor / Sensory Function: Within functional limits Self-Feeding Abilities: Able to feed self Patient Positioning: Upright in  chair Baseline Vocal Quality: Clear Volitional Cough:  Congested;Strong Volitional Swallow: Able to elicit Anatomy: Within functional limits Pharyngeal Secretions: Not observed secondary MBS    Reason for Referral Objectively evaluate swallowing function   Oral Phase Oral Preparation/Oral Phase Oral Phase: WFL   Pharyngeal Phase Pharyngeal Phase Pharyngeal Phase: Impaired Pharyngeal - Thin Pharyngeal - Thin Cup: Delayed swallow initiation;Premature spillage to pyriform sinuses;Pharyngeal residue - valleculae;Pharyngeal residue - pyriform sinuses;Penetration/Aspiration before swallow;Reduced tongue base retraction (trace residue) Penetration/Aspiration details (thin cup): Material enters airway, CONTACTS cords and not ejected out Pharyngeal - Thin Straw: Delayed swallow initiation;Premature spillage to pyriform sinuses;Pharyngeal residue - pyriform sinuses;Pharyngeal residue - valleculae;Penetration/Aspiration before swallow;Reduced tongue base retraction Penetration/Aspiration details (thin straw): Material enters airway, passes BELOW cords without attempt by patient to eject out (silent aspiration) Pharyngeal - Solids Pharyngeal - Puree: Delayed swallow initiation;Premature spillage to valleculae;Pharyngeal residue - valleculae;Reduced tongue base retraction Pharyngeal - Mechanical Soft: Delayed swallow initiation;Premature spillage to valleculae;Pharyngeal residue - valleculae;Reduced tongue base retraction Pharyngeal - Pill: Delayed swallow initiation;Premature spillage to valleculae (provided whole in pureed solid)  Cervical Esophageal Phase    GO   Rayaan Garguilo MA, CCC-SLP [phone]  Cervical Esophageal Phase Cervical Esophageal Phase: WFL (sweep revealed pill remaining in mid esophagus)         Dryden Tapley Meryl 05/29/2013, 1:54 PM

## 2013-05-29 NOTE — Progress Notes (Signed)
Pt brought down to Room 1230 for a rapid response for labored breathing and onset of Afib RVR. Patient arrived to room around 0530. Patient placed on monitoring system. Elink and central monitoring contacted. Everett Graff NP present at bedside. Cardizem and bipap PRN ordered. Will continue to monitor.

## 2013-05-30 DIAGNOSIS — F0390 Unspecified dementia without behavioral disturbance: Secondary | ICD-10-CM

## 2013-05-30 LAB — CBC
HCT: 28.2 % — ABNORMAL LOW (ref 36.0–46.0)
Hemoglobin: 9.2 g/dL — ABNORMAL LOW (ref 12.0–15.0)
MCH: 29.7 pg (ref 26.0–34.0)
MCHC: 32.6 g/dL (ref 30.0–36.0)
MCV: 91 fL (ref 78.0–100.0)
Platelets: 244 K/uL (ref 150–400)
RBC: 3.1 MIL/uL — ABNORMAL LOW (ref 3.87–5.11)
RDW: 13.4 % (ref 11.5–15.5)
WBC: 10.2 K/uL (ref 4.0–10.5)

## 2013-05-30 LAB — PRO B NATRIURETIC PEPTIDE: Pro B Natriuretic peptide (BNP): 3363 pg/mL — ABNORMAL HIGH (ref 0–450)

## 2013-05-30 LAB — BASIC METABOLIC PANEL WITH GFR
BUN: 47 mg/dL — ABNORMAL HIGH (ref 6–23)
CO2: 29 meq/L (ref 19–32)
Calcium: 9.1 mg/dL (ref 8.4–10.5)
Chloride: 94 meq/L — ABNORMAL LOW (ref 96–112)
Creatinine, Ser: 1.52 mg/dL — ABNORMAL HIGH (ref 0.50–1.10)
GFR calc Af Amer: 34 mL/min — ABNORMAL LOW (ref >90)
GFR calc non Af Amer: 29 mL/min — ABNORMAL LOW (ref >90)
Glucose, Bld: 234 mg/dL — ABNORMAL HIGH (ref 70–99)
Potassium: 3.9 meq/L (ref 3.7–5.3)
Sodium: 134 meq/L — ABNORMAL LOW (ref 137–147)

## 2013-05-30 LAB — CULTURE, RESPIRATORY

## 2013-05-30 LAB — GLUCOSE, CAPILLARY
Glucose-Capillary: 189 mg/dL — ABNORMAL HIGH (ref 70–99)
Glucose-Capillary: 299 mg/dL — ABNORMAL HIGH (ref 70–99)

## 2013-05-30 LAB — CULTURE, RESPIRATORY W GRAM STAIN

## 2013-05-30 MED ORDER — DILTIAZEM HCL ER 60 MG PO CP12
60.0000 mg | ORAL_CAPSULE | ORAL | Status: AC
  Filled 2013-05-30: qty 1

## 2013-05-30 MED ORDER — IPRATROPIUM-ALBUTEROL 0.5-2.5 (3) MG/3ML IN SOLN
3.0000 mL | Freq: Three times a day (TID) | RESPIRATORY_TRACT | Status: DC
  Administered 2013-05-30 – 2013-05-31 (×3): 3 mL via RESPIRATORY_TRACT
  Filled 2013-05-30 (×4): qty 3

## 2013-05-30 MED ORDER — FUROSEMIDE 10 MG/ML IJ SOLN
20.0000 mg | Freq: Two times a day (BID) | INTRAMUSCULAR | Status: DC
  Administered 2013-05-30 – 2013-05-31 (×2): 20 mg via INTRAVENOUS
  Filled 2013-05-30 (×2): qty 2

## 2013-05-30 MED ORDER — DILTIAZEM HCL ER 90 MG PO CP12
180.0000 mg | ORAL_CAPSULE | Freq: Two times a day (BID) | ORAL | Status: DC
  Administered 2013-05-30 – 2013-05-31 (×2): 180 mg via ORAL
  Filled 2013-05-30 (×3): qty 2

## 2013-05-30 MED ORDER — DIPHENHYDRAMINE HCL 12.5 MG/5ML PO ELIX
12.5000 mg | ORAL_SOLUTION | Freq: Once | ORAL | Status: AC
  Administered 2013-05-30: 12.5 mg via ORAL
  Filled 2013-05-30: qty 5

## 2013-05-30 NOTE — Progress Notes (Signed)
Occupational Therapy Treatment Patient Details Name: Lori [name] MRN: [id number] DOB: [birth year] Today's Date: 05/30/2013 Time: 4098-1191 OT Time Calculation (min): 15 min  OT Assessment / Plan / Recommendation  History of present illness Pt was admitted with CAP   OT comments  Pt willing to participate in therapy; progressing slowly  Follow Up Recommendations  SNF (unless ALF can provide min to max A for mobility/adls)    Barriers to Discharge       Equipment Recommendations  3 in 1 bedside comode    Recommendations for Other Services    Frequency Min 2X/week   Progress towards OT Goals Progress towards OT goals: Progressing toward goals (slowly)  Plan      Precautions / Restrictions Precautions Precautions: Fall Restrictions Weight Bearing Restrictions: No   Pertinent Vitals/Pain No pain reported.  VSS    ADL  Toilet Transfer: Performed;Minimal assistance Toilet Transfer Method: Stand pivot Toilet Transfer Equipment: Bedside commode Toileting - Clothing Manipulation and Hygiene: Maximal assistance Where Assessed - Toileting Clothing Manipulation and Hygiene: Sit to stand from 3-in-1 or toilet Transfers/Ambulation Related to ADLs: transferred to 3:1 from recliner then ambulated 3 feet back to bed (mod A) ADL Comments: Pt needed max A for toilet hygiene and to stabilize self as she tends to lean backwards.  Able to follow strategies to tuck chin with beverage    OT Diagnosis:    OT Problem List:   OT Treatment Interventions:     OT Goals(current goals can now be found in the care plan section)    Visit Information  Last OT Received On: 05/30/13 Assistance Needed: +1 History of Present Illness: Pt was admitted with CAP    Subjective Data      Prior Functioning  Home Living Additional Comments: from ALF; may need snf Prior Function Level of Independence: Independent with assistive device(s) Comments: pt reports using walker Communication Communication:  No difficulties    Cognition  Cognition Arousal/Alertness: Awake/alert Behavior During Therapy: WFL for tasks assessed/performed Overall Cognitive Status: History of cognitive impairments - at baseline Memory: Decreased short-term memory    Mobility  Bed Mobility Bed Mobility: Supine to Sit Sit to Supine: 4: Min assist Details for Bed Mobility Assistance: assist for LEs Transfers Sit to Stand: 4: Min assist Stand to Sit: 4: Min assist Details for Transfer Assistance: cues for safe technique, assist to rise, steady, and control descent    Exercises      Balance Balance Balance Assessed: Yes Static Standing Balance Static Standing - Balance Support: Left upper extremity supported Static Standing - Level of Assistance: 3: Mod assist Static Standing - Comment/# of Minutes: 1 minute during hygiene   End of Session OT - End of Session Activity Tolerance: Patient tolerated treatment well Patient left: in bed;with call bell/phone within reach  GO     Ermon Sagan 05/30/2013, 2:24 PM Marica Otter, OTR/L [phone] 05/30/2013

## 2013-05-30 NOTE — Progress Notes (Signed)
Physical Therapy Treatment Patient Details Name: Lori [name] MRN: [id number] DOB: 25-Sep-1920 Today's Date: 05/30/2013 Time: 4098-1191 PT Time Calculation (min): 31 min  PT Assessment / Plan / Recommendation  History of Present Illness Pt was admitted with CAP   PT Comments   Pt states she knows she may not beable to go back to her apt. Pt mobiilzed today  Several times and is moving to tele.  Follow Up Recommendations  SNF     Does the patient have the potential to tolerate intense rehabilitation     Barriers to Discharge        Equipment Recommendations  None recommended by PT    Recommendations for Other Services    Frequency Min 3X/week   Progress towards PT Goals Progress towards PT goals: Progressing toward goals  Plan Current plan remains appropriate;Discharge plan needs to be updated    Precautions / Restrictions Precautions Precautions: Fall Precaution Comments: monitor sats Restrictions Weight Bearing Restrictions: No   Pertinent Vitals/Pain sats on RA dropped to 80's replaced to 3 l.     Mobility  Bed Mobility Bed Mobility: Supine to Sit Supine to Sit: 4: Min assist Sit to Supine: 4: Min assist Details for Bed Mobility Assistance: assist for LEs Transfers Transfers: Stand Pivot Transfers Sit to Stand: 4: Min assist;From bed;From chair/3-in-1;With upper extremity assist Stand to Sit: 4: Min assist;To chair/3-in-1;With upper extremity assist Stand Pivot Transfers: 4: Min assist Details for Transfer Assistance: cues for safe technique, assist to rise, steady, and control descent, extra time  for pivoting to chair then to [facility] then to chair again    Exercises     PT Diagnosis:    PT Problem List:   PT Treatment Interventions:     PT Goals (current goals can now be found in the care plan section)    Visit Information  Last PT Received On: 05/30/13 Assistance Needed: +2 (to walk) History of Present Illness: Pt was admitted with CAP    Subjective  Data      Cognition  Cognition Arousal/Alertness: Awake/alert Behavior During Therapy: WFL for tasks assessed/performed Overall Cognitive Status: History of cognitive impairments - at baseline Memory: Decreased short-term memory    Balance  Balance Balance Assessed: Yes Static Standing Balance Static Standing - Balance Support: Left upper extremity supported Static Standing - Level of Assistance: 3: Mod assist Static Standing - Comment/# of Minutes: 1 minute during hygiene  End of Session PT - End of Session Activity Tolerance: Patient limited by fatigue;Patient tolerated treatment well Patient left: in chair;with nursing/sitter in room Nurse Communication: Mobility status   GP     Rada Hay 05/30/2013, 5:28 PM Blanchard Kelch PT [phone]

## 2013-05-30 NOTE — Progress Notes (Signed)
[facility] PROGRESS NOTE  SIMONA ROCQUE YQM:[id number] DOB: 01-Sep-1920 DOA: 05/27/2013 PCP: Thayer Headings, MD  Assessment/Plan: Principal Problem: Acute respiratory failure: Multifactorial secondary to mild COPD, aspiration pneumonia and acute diastolic heart failure, likely made worse by atrial fibrillation. Result.  Aspiration pneumonia: Change antibiotics for broader coverage. Concerning given her dementia for possibility of aspiration pneumonia. Speech therapy followup in patient on dysphagia 3 diet with thin liquids. Still having low-grade temps and his fear that she has some microaspiration I'll be unavoidable given her cognitive deficits to perform aspiration precautions  Active Problems:   Atrial fibrillation with RVR: Some difficulty controlling rate, likely because of volume overload. Weaned off Cardizem drip and on by mouth.  Increase dose for better rate control.     COPD exacerbation:  nebulizers plus oxygen. As above. Oxygenation better.    Acute diastolic CHF (congestive heart failure): BNP elevated at 4000. Echocardiogram done March 2013, normal systolic function but could not comment on diastolic dysfunction: Starting Lasix yesterday, patient diuresed over 2 L. awaiting BNP from today    HTN (hypertension): Blood pressure stable. Down earlier secondary to atrial fibrillation, increased with rate control. Should improve as we diurese    GERD (gastroesophageal reflux disease): Stable.    Hyponatremia, slowly improved, secondary dehydration, secondary to pneumonia    Leukocytosis: Resolved. Also noted low-grade temps this morning. Discontinuing Zithromax and Rocephin in exchange for more broad-spectrum antibiotics to cover for aspiration pneumonia.    Anemia: Likely secondary to chronic disease. Actually improved from yesterday.    Acute renal failure: Mild increased today, likely from diuresis  Code Status: Full code Family Communication: Spoke with patient's  son in person  Disposition Plan: Marked improvement. Transfer to floor today. Possible back to assisted living tomorrow  Consultants:  None  Procedures:  None  Antibiotics: IV Rocephin and Zithromax 12/27-12/29 IV vancomycin 12/29-present IV Zosyn 12/29-present    HPI/Subjective: Patient much more awake and alert. Interactive. No acute distress.  Objective: Filed Vitals:   05/30/13 0800  BP: 94/51  Pulse: 95  Temp: 99 F (37.2 C)  Resp: 15    Intake/Output Summary (Last 24 hours) at 05/30/13 1102 Last data filed at 05/30/13 0800  Gross per 24 hour  Intake   1180 ml  Output   2000 ml  Net   -820 ml   Filed Weights   05/28/13 0533 05/29/13 0501 05/29/13 0534  Weight: 46.8 kg (103 lb 2.8 oz) 48.2 kg (106 lb 4.2 oz) 48 kg (105 lb 13.1 oz)    Exam:   General:  Patient somewhat, however alert and oriented when woken. Feeling better.  Cardiovascular: Irregular rhythm-rate controlled  Respiratory: Scattered rales, end expiratory wheeze-overall improved  Abdomen: Soft, nontender, nondistended, positive bowel sounds  Musculoskeletal: No clubbing or cyanosis, trace edema   Data Reviewed: Basic Metabolic Panel:  Recent Labs Lab 05/27/13 1446 05/27/13 1735 05/28/13 0527 05/29/13 0623 05/30/13 0350  NA 128* 132* 132* 133* 134*  K 4.4 4.8 4.4 4.6 3.9  CL 96 101 101 97 94*  CO2 21 23 22 26 29   GLUCOSE 357* 278* 320* 216* 234*  BUN 38* 37* 42* 46* 47*  CREATININE 1.47* 1.45* 1.47* 1.47* 1.52*  CALCIUM 9.2 9.4 9.2 9.3 9.1  MG  --  1.7  --   --   --   PHOS  --  2.3  --   --   --    Liver Function Tests:  Recent Labs Lab 05/27/13 1735  AST 19  ALT 13  ALKPHOS 97  BILITOT 0.4  PROT 7.8  ALBUMIN 3.1*   No results found for this basename: LIPASE, AMYLASE,  in the last 168 hours No results found for this basename: AMMONIA,  in the last 168 hours CBC:  Recent Labs Lab 05/27/13 1446 05/27/13 2013 05/28/13 0527 05/29/13 0623 05/30/13 0350  WBC  14.5* 12.9* 11.7* 12.6* 10.2  NEUTROABS 11.2* 12.4*  --   --   --   HGB 9.8* 9.4* 8.7* 9.5* 9.2*  HCT 30.0* 28.9* 26.2* 28.7* 28.2*  MCV 91.5 91.2 90.7 91.1 91.0  PLT 220 211 207 230 244   Cardiac Enzymes: No results found for this basename: CKTOTAL, CKMB, CKMBINDEX, TROPONINI,  in the last 168 hours BNP (last 3 results)  Recent Labs  05/28/13 0527  PROBNP 4043.0*   CBG:  Recent Labs Lab 05/29/13 0810 05/29/13 1156 05/29/13 1543 05/29/13 2113 05/30/13 0825  GLUCAP 192* 169* 291* 130* 189*    Recent Results (from the past 240 hour(s))  CULTURE, BLOOD (ROUTINE X 2)     Status: None   Collection Time    05/27/13  4:10 PM      Result Value Range Status   Specimen Description BLOOD RIGHT ANTECUBITAL   Final   Special Requests BOTTLES DRAWN AEROBIC ONLY 4CC   Final   Culture  Setup Time     Final   Value: 05/27/2013 20:15     Performed at [facility]   Culture     Final   Value:        BLOOD CULTURE RECEIVED NO GROWTH TO DATE CULTURE WILL BE HELD FOR 5 DAYS BEFORE ISSUING A FINAL NEGATIVE REPORT     Performed at [facility]   Report Status PENDING   Incomplete  CULTURE, BLOOD (ROUTINE X 2)     Status: None   Collection Time    05/27/13  4:15 PM      Result Value Range Status   Specimen Description BLOOD RIGHT FOREARM   Final   Special Requests     Final   Value: BOTTLES DRAWN AEROBIC AND ANAEROBIC 4CC AEROBIC, 3 CC ANAEROBIC   Culture  Setup Time     Final   Value: 05/27/2013 20:14     Performed at [facility]   Culture     Final   Value:        BLOOD CULTURE RECEIVED NO GROWTH TO DATE CULTURE WILL BE HELD FOR 5 DAYS BEFORE ISSUING A FINAL NEGATIVE REPORT     Performed at [facility]   Report Status PENDING   Incomplete  URINE CULTURE     Status: None   Collection Time    05/27/13  7:10 PM      Result Value Range Status   Specimen Description URINE, RANDOM   Final   Special Requests NONE   Final   Culture  Setup Time      Final   Value: 05/28/2013 03:33     Performed at [facility] Count     Final   Value: NO GROWTH     Performed at [facility]   Culture     Final   Value: NO GROWTH     Performed at [facility]   Report Status 05/29/2013 FINAL   Final  MRSA PCR SCREENING     Status: None   Collection Time    05/27/13  7:10 PM  Result Value Range Status   MRSA by PCR NEGATIVE  NEGATIVE Final   Comment:            The GeneXpert MRSA Assay (FDA     approved for NASAL specimens     only), is one component of a     comprehensive MRSA colonization     surveillance program. It is not     intended to diagnose MRSA     infection nor to guide or     monitor treatment for     MRSA infections.  CULTURE, EXPECTORATED SPUTUM-ASSESSMENT     Status: None   Collection Time    05/27/13 10:35 PM      Result Value Range Status   Specimen Description SPUTUM   Final   Special Requests NONE   Final   Sputum evaluation     Final   Value: MICROSCOPIC FINDINGS SUGGEST THAT THIS SPECIMEN IS NOT REPRESENTATIVE OF LOWER RESPIRATORY SECRETIONS. PLEASE RECOLLECT.     CALLED CAUGHRON,D/4E @0125  ON 05/28/13 BY KARCZEWSKI,S.   Report Status 05/28/2013 FINAL   Final  CULTURE, EXPECTORATED SPUTUM-ASSESSMENT     Status: None   Collection Time    05/28/13  3:44 AM      Result Value Range Status   Specimen Description SPU   Final   Special Requests Normal   Final   Sputum evaluation     Final   Value: THIS SPECIMEN IS ACCEPTABLE. RESPIRATORY CULTURE REPORT TO FOLLOW.   Report Status 05/28/2013 FINAL   Final  CULTURE, RESPIRATORY (NON-EXPECTORATED)     Status: None   Collection Time    05/28/13  3:44 AM      Result Value Range Status   Specimen Description SPUTUM   Final   Special Requests NONE   Final   Gram Stain     Final   Value: ABUNDANT WBC PRESENT, PREDOMINANTLY PMN     NO SQUAMOUS EPITHELIAL CELLS SEEN     FEW GRAM POSITIVE COCCI     IN PAIRS RARE GRAM POSITIVE RODS      Performed at [facility]   Culture     Final   Value: NORMAL OROPHARYNGEAL FLORA     Performed at [facility]   Report Status 05/30/2013 FINAL   Final     Studies: Dg Chest 1 View  05/29/2013   CLINICAL DATA:  Shortness of breath  EXAM: CHEST - 1 VIEW  COMPARISON:  05/27/2013  FINDINGS: Borderline heart size and pulmonary vascularity. Peribronchial thickening and perihilar streaky opacities consistent with chronic bronchitic change. Interstitial fibrosis in the lung bases. Patchy airspace disease in the left lung and right lung base appear to be increasing since previous study. Linear shadow along the right lateral chest probably represents soft tissue fold. No definite pneumothorax. No blunting of costophrenic angles. Calcification of the aorta. Degenerative changes in the shoulders.  IMPRESSION: Patchy infiltrates in the left mid and right lower lung zones are increasing since previous study. Developing pneumonia should be considered. Stable chronic changes in the chest.   Electronically Signed   By: Burman Nieves M.D.   On: 05/29/2013 05:56   Dg Swallowing Func-speech Pathology  05/29/2013   Vivi Ferns McCoy, CCC-SLP     05/29/2013  2:05 PM Objective Swallowing Evaluation: Modified Barium Swallowing Study   Patient Details  Name: Lori [name] MRN: [id number] Date of Birth: 1920/10/11  Today's Date: 05/29/2013 Time: 1325-1340  SLP Time Calculation (min): 20 min  Past Medical History:  Past Medical History  Diagnosis Date  . Diabetes mellitus   . Hypertension   . Atrial fibrillation   . Hyperlipidemia   . Peripheral neuropathy   . Gastric AVM   . GERD (gastroesophageal reflux disease)   . Diabetic Charcot foot   . Shortness of breath    Past Surgical History:  Past Surgical History  Procedure Laterality Date  . Tonsillectomy    . Lung removal, partial    . Thyroidectomy, partial    . Hip arthroplasty Left 08/18/2012    Procedure: ARTHROPLASTY BIPOLAR HIP;  Surgeon: Eldred Manges,   MD;  Location: [facility];  Service: Orthopedics;  Laterality: Left;   HPI:  79 year old female admitted with acute respiratory failure,  multifactorial in nature due to mild COPD,  suspected aspiration  PNa, and acute diastolic heart failure made worse by atrial  fibrillation. Developed worsening respiratory function on 12/29  am requiring transfer to ICU. Has now stabilized. Patient does  not endorse any h/o difficulty swallowing.      Assessment / Plan / Recommendation Clinical Impression  Dysphagia Diagnosis: Mild pharyngeal phase dysphagia Clinical impression: Patient presents with a mild oro-pharyngeal  phase dysphagia with primary origin suspected to be respiratory  based in nature. Base of tongue weakness and decreased  coordination of breath and swallow results in a delayed swallow  initiation and leads to silent penetration and trace aspiration  of thin liquids, prevented with SLP visual and tactile cueing for  a chin tuck. Trace residuals s/p swallow due to base of tongue  weakness are cleared with combination of spontaneous and cued dry  swallows. Recommend patient advance to a dysphagia 3 (for energy  conservation and pharyngeal clearance) diet with thin liquids.  Aspiration risk moderately high but can be decreased with use of  compensatory strategies. SLP will f/u for diet tolerance and  ability to advance diet/lift precautions. Given advanced age,  suspect some degree of baseline dysphagia which may now be  exacerbated by acute condition.     Treatment Recommendation  Therapy as outlined in treatment plan below    Diet Recommendation NPO   Liquid Administration via: Cup;Straw (straw ok to facilitate use  of chin tuck) Medication Administration: Via alternative means Supervision: Patient able to self feed;Full supervision/cueing  for compensatory strategies Compensations: Slow rate;Small sips/bites Postural Changes and/or Swallow Maneuvers: Seated upright 90  degrees;Chin tuck (ok to use straws to  facilitate chin tuck)    Other  Recommendations Recommended Consults: MBS Oral Care Recommendations: Oral care Q4 per protocol   Follow Up Recommendations   (TBD)    Frequency and Duration min 2x/week  2 weeks        General HPI: 79 year old female admitted with acute respiratory  failure, multifactorial in nature due to mild COPD,  suspected  aspiration PNa, and acute diastolic heart failure made worse by  atrial fibrillation. Developed worsening respiratory function on  12/29 am requiring transfer to ICU. Has now stabilized. Patient  does not endorse any h/o difficulty swallowing.  Type of Study: Modified Barium Swallowing Study Reason for Referral: Objectively evaluate swallowing function Previous Swallow Assessment: none Diet Prior to this Study: NPO Temperature Spikes Noted: No Respiratory Status: Nasal cannula History of Recent Intubation: No Behavior/Cognition: Alert;Cooperative;Pleasant mood Oral Cavity - Dentition: Adequate natural dentition Oral Motor / Sensory Function: Within functional limits Self-Feeding Abilities: Able to feed self Patient Positioning: Upright in chair Baseline Vocal Quality: Clear Volitional  Cough: Congested;Strong Volitional Swallow: Able to elicit Anatomy: Within functional limits Pharyngeal Secretions: Not observed secondary MBS    Reason for Referral Objectively evaluate swallowing function   Oral Phase Oral Preparation/Oral Phase Oral Phase: WFL   Pharyngeal Phase Pharyngeal Phase Pharyngeal Phase: Impaired Pharyngeal - Thin Pharyngeal - Thin Cup: Delayed swallow initiation;Premature  spillage to pyriform sinuses;Pharyngeal residue -  valleculae;Pharyngeal residue - pyriform  sinuses;Penetration/Aspiration before swallow;Reduced tongue base  retraction (trace residue) Penetration/Aspiration details (thin cup): Material enters  airway, CONTACTS cords and not ejected out Pharyngeal - Thin Straw: Delayed swallow initiation;Premature  spillage to pyriform sinuses;Pharyngeal residue  - pyriform  sinuses;Pharyngeal residue - valleculae;Penetration/Aspiration  before swallow;Reduced tongue base retraction Penetration/Aspiration details (thin straw): Material enters  airway, passes BELOW cords without attempt by patient to eject  out (silent aspiration) Pharyngeal - Solids Pharyngeal - Puree: Delayed swallow initiation;Premature spillage  to valleculae;Pharyngeal residue - valleculae;Reduced tongue base  retraction Pharyngeal - Mechanical Soft: Delayed swallow  initiation;Premature spillage to valleculae;Pharyngeal residue -  valleculae;Reduced tongue base retraction Pharyngeal - Pill: Delayed swallow initiation;Premature spillage  to valleculae (provided whole in pureed solid)  Cervical Esophageal Phase    GO   Ferdinand Lango MA, CCC-SLP [phone]  Cervical Esophageal Phase Cervical Esophageal Phase: WFL (sweep revealed pill remaining in  mid esophagus)         McCoy Leah Meryl 05/29/2013, 1:54 PM     Scheduled Meds: . calcium carbonate  1 tablet Oral BID WC  . cefTRIAXone (ROCEPHIN)  IV  1 g Intravenous Q24H  . diltiazem  120 mg Oral Q12H  . feeding supplement (ENSURE COMPLETE)  237 mL Oral TID BM  . furosemide  40 mg Intravenous Q12H  . guaiFENesin  600 mg Oral BID  . insulin aspart  0-9 Units Subcutaneous TID WC  . ipratropium-albuterol  3 mL Nebulization TID  . metronidazole  500 mg Intravenous Q8H  . mirtazapine  15 mg Oral QHS  . multivitamin with minerals  1 tablet Oral q morning - 10a  . pantoprazole  40 mg Oral q morning - 10a  . polyethylene glycol  17 g Oral Daily  . potassium chloride  10 mEq Oral Daily  . senna-docusate  1 tablet Oral q morning - 10a  . vancomycin  500 mg Intravenous Q48H   Continuous Infusions: . diltiazem (CARDIZEM) infusion 5 mg/hr (05/29/13 0549)    Principal Problem:   Community acquired pneumonia Active Problems:   Atrial fibrillation with RVR   COPD exacerbation   Acute diastolic CHF (congestive heart failure)   HTN  (hypertension)   GERD (gastroesophageal reflux disease)   Hyponatremia   Leukocytosis   Anemia   Acute renal failure    Time spent: 25 minutes    Hollice Espy  [facility] Pager [phone]. If 7PM-7AM, please contact night-coverage at [email or web address], password [facility] 05/30/2013, 11:02 AM  LOS: 3 days

## 2013-05-30 NOTE — Progress Notes (Signed)
Clinical Social Work Department BRIEF PSYCHOSOCIAL ASSESSMENT 05/30/2013  Patient:  Lori [name], Lori [name]     Account Number:  [id number]     Admit date:  05/27/2013  Clinical Social Worker:  Jacelyn Grip  Date/Time:  05/30/2013 04:00 PM  Referred by:  Physician  Date Referred:  05/30/2013 Referred for  ALF Placement   Other Referral:   Interview type:  Patient Other interview type:    PSYCHOSOCIAL DATA Living Status:  FACILITY Admitted from facility:  [facility] Level of care:  Assisted Living Primary support name:  Johnny Gaertner/[phone] Primary support relationship to patient:  CHILD, ADULT Degree of support available:   unknown at this time, no family present at bedside    CURRENT CONCERNS Current Concerns  Post-Acute Placement   Other Concerns:    SOCIAL WORK ASSESSMENT / PLAN CSW received referral that pt admitted from [facility].    CSW reviewed chart and noted that PT/OT recommending HH PT/OT at ALF. CSW met with pt at bedside. Pt confirmed that she is a resident at [facility]. Pt discussed that she would like to return to [facility] and feels that facility will be able to meet pt needs. Pt agreeable to CSW contacting [facility] to discuss pt needs and ensure that pt will be able to return to [facility].    CSW contacted [facility]. CSW discussed pt needs and PT/OT recommendations. Per [facility], facility anticipates that they can accept pt back, but the individual this CSW spoke to stated that she was unable to make that decision and CSW will have to call back tomorrow and speak to Laureen or [place] regarding final decision if [facility] is able to accept pt back.    CSW to contact [facility] tomorrow morning and speak to Laureen and Marcelino Duster to determine if [facility] can accept pt back.    CSW to continue to follow to assist with pt discharge planning needs.   Assessment/plan status:   Psychosocial Support/Ongoing Assessment of Needs Other assessment/ plan:   discharge planning   Information/referral to community resources:   Referral back to [facility]    PATIENT'S/FAMILY'S RESPONSE TO PLAN OF CARE: Pt alert and oriented x 4. Pt stated that she has been a resident at [facility] for 3 years and pt eager to return there. CSW awaiting to speak to appropriate person at [facility] to determine if facility can accept pt back to ALF.    Jacklynn Lewis, MSW, LCSW Clinical Social Work [phone]

## 2013-05-30 NOTE — Progress Notes (Signed)
Inpatient Diabetes Program Recommendations  AACE/ADA: New Consensus Statement on Inpatient Glycemic Control (2013)  Target Ranges:  Prepandial:   less than 140 mg/dL      Peak postprandial:   less than 180 mg/dL (1-2 hours)      Critically ill patients:  140 - 180 mg/dL     Results for Lori [name], Lori [name] (MRN [id number]) as of 05/30/2013 15:00  Ref. Range 05/30/2013 08:25 05/30/2013 11:59  Glucose-Capillary Latest Range: 70-99 mg/dL 409 (H) 811 (H)    **Patient with history of Type 2 DM per Dr. Izola Price' H&P.  Not taking any medications at home.  A1c at goal (7.2%- 05/28/13).   **MD- Please consider increasing Novolog SSI to Moderate scale tid ac + HS (currently on Sensitive scale)    Will follow. Ambrose Finland RN, MSN, CDE Diabetes Coordinator Inpatient Diabetes Program Team Pager: [phone] (8a-10p)

## 2013-05-31 DIAGNOSIS — E1129 Type 2 diabetes mellitus with other diabetic kidney complication: Secondary | ICD-10-CM | POA: Diagnosis present

## 2013-05-31 LAB — CBC
HCT: 29.1 % — ABNORMAL LOW (ref 36.0–46.0)
Hemoglobin: 9.2 g/dL — ABNORMAL LOW (ref 12.0–15.0)
MCHC: 31.6 g/dL (ref 30.0–36.0)
MCV: 91.5 fL (ref 78.0–100.0)
RBC: 3.18 MIL/uL — ABNORMAL LOW (ref 3.87–5.11)
RDW: 13.1 % (ref 11.5–15.5)

## 2013-05-31 LAB — GLUCOSE, CAPILLARY
Glucose-Capillary: 135 mg/dL — ABNORMAL HIGH (ref 70–99)
Glucose-Capillary: 136 mg/dL — ABNORMAL HIGH (ref 70–99)

## 2013-05-31 LAB — BASIC METABOLIC PANEL
BUN: 47 mg/dL — ABNORMAL HIGH (ref 6–23)
CO2: 30 mEq/L (ref 19–32)
Calcium: 9.3 mg/dL (ref 8.4–10.5)
Chloride: 94 mEq/L — ABNORMAL LOW (ref 96–112)
Creatinine, Ser: 1.6 mg/dL — ABNORMAL HIGH (ref 0.50–1.10)
GFR calc Af Amer: 32 mL/min — ABNORMAL LOW (ref >90)
Glucose, Bld: 181 mg/dL — ABNORMAL HIGH (ref 70–99)
Potassium: 4.8 mEq/L (ref 3.7–5.3)

## 2013-05-31 MED ORDER — GLIPIZIDE ER 2.5 MG PO TB24: 2.5000 mg | ORAL_TABLET | Freq: Every day | ORAL | Status: DC

## 2013-05-31 MED ORDER — ALBUTEROL SULFATE HFA 108 (90 BASE) MCG/ACT IN AERS: 2.0000 | INHALATION_SPRAY | Freq: Four times a day (QID) | RESPIRATORY_TRACT | Status: DC | PRN

## 2013-05-31 MED ORDER — LEVOFLOXACIN 500 MG PO TABS: 500.0000 mg | ORAL_TABLET | Freq: Every day | ORAL | Status: AC

## 2013-05-31 MED ORDER — FUROSEMIDE 20 MG PO TABS: ORAL_TABLET | ORAL | Status: DC

## 2013-05-31 MED ORDER — DILTIAZEM HCL ER 90 MG PO CP12: 180.0000 mg | ORAL_CAPSULE | Freq: Two times a day (BID) | ORAL | Status: DC

## 2013-05-31 NOTE — Progress Notes (Signed)
Called [facility] to give report to RN but they were unable to take report at this time because the RN was on the floor and very busy.  Gave my name and number for RN, Karin Golden, to call me back to get report.  SW aware that patient is ready for d/c. Transportation will be called.

## 2013-05-31 NOTE — Progress Notes (Signed)
Patient was currently wearing 3 L/M . Patient is to be discharged to [facility] today.  Checked oxygen while ambulating in the hall and it ranged from 93-97% with majority staying 96/97%. After returning to room, patient was sitting up in the chair on RA with oxygen sat 97%.

## 2013-05-31 NOTE — Discharge Summary (Signed)
Physician Discharge Summary  Lori [name] WJX:[id number] DOB: June 18, 1920 DOA: 05/27/2013  PCP: Thayer Headings, MD  Admit date: 05/27/2013 Discharge date: 05/31/2013  Time spent: 35 minutes  Recommendations for Outpatient Follow-up:  1. Patient is being discharged on 4 more days of Levaquin for total 7 days of therapy treat suspected aspiration pneumonia 2. Short-term Medicine change: Lasix 20 mg by mouth twice a day x5 days then back to 20 mg daily 3. New Medicine: albuterol MDI 2 puffs 4 times a day when necessary 4. New medicine: Glucotrol XR. 2.5 mg 5. Medication change: Cardizem 60 3 times a day is being changed to Cardizem SR 180 mg every 12 hours 6. Diet change: Dysphagia 3 diet with following recommendations:  Liquid Administration via: Cup, Straw ok, Slow rate;Small sips/bites, Seated upright 90 degrees, Chin tuck (ok to use straws to facilitate chin tuck)    7. Patient is to have repeat swallow evaluation done by speech therapy in 2 weeks 8. 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    Discharge Condition: Improved, being discharged to skilled nursing  Diet recommendation: Dysphagia 3 diet with following recommendations:  Liquid Administration via: Cup; Straw ok, Slow rate;Small sips/bites Seated upright 90 degrees, Chin tuck (ok to use straws to facilitate chin tuck)     Filed Weights   05/29/13 0501 05/29/13 0534 05/31/13 0500  Weight: 48.2 kg (106 lb 4.2 oz) 48 kg (105 lb 13.1 oz) 48.2 kg (106 lb 4.2 oz)     History of present illness:  79 year old white female with past medical history diabetes mellitus, atrial fibrillation, chronic renal insufficiency and hypertension plus questionable mild dementia presented to [facility] emergency room on 12/27 from assisted living with several days of progressively worsening shortness of breath, nonproductive cough and fever chills and malaise. In the emergency room, patient was found to be in rapid atrial fibrillation, have a low-grade temperature of 101, chest x-ray concerning for pneumonia and white blood cell count 14,000. Patient was admitted to the hospitalist service. She was started on antibiotics for to require pneumonia, had flu PCR checked, continued on her oral Cardizem and started on steroids plus meds plus oxygen and IV fluids.   Hospital Course:  Principal Problem:   Aspiration pneumonia: By hospital day 2, patient to be more dyspneic and wheezing. Flu PCR was negative. Concerned given her state that she was having episodes of aspiration. She was made n.p.o. and following treatment of atrial fibrillation and respiratory failure-see below, evaluated by speech therapy who recommended dysphagia 3 diet with thin liquids. Further recommendations are as listed above. Patient will have followup with speech therapy in several weeks.  Active Problems:   Atrial fibrillation with RVR: Given worsening respiratory status, patient's heart rate continued to elevate. She was placed on Cardizem drip on evening of 12/28. By next day, she was able to be weaned slowly off drip and required additional Cardizem. Her Cardizem dose is now being changed to Cardizem 180 every 12 hours. She's not felt to be a Coumadin candidate given high risks for  falls    COPD exacerbation: Actually stable. Patient's respiratory issues were felt more from volume overload and aspiration pneumonia. She is only on scheduled Pulmicort, so have added when necessary albuterol to her regimen. See  below.    Acute diastolic CHF (congestive heart failure): Patient had an echocardiogram done in March of 2013 which noted preserved systolic function, but could not comment on diastolic dysfunction, likely given her chronic lung disease. After patient was moved to step down unit, BNP checked and found to be elevated at 4043. Patient started on IV diuretics and diuresed several liters. Her renal function starting to slightly increase, so plan will be for patient to be discharged on 20 mg by mouth twice a day x5 days and then back to daily Lasix 20 mg.    HTN (hypertension): Initially blood pressure medications were held given episodes of hypotension. This is been resumed.    GERD (gastroesophageal reflux disease): Stable medically she. Patient continued on PPI.    Hyponatremia: Patient initially presented with sodium of 128. Following IV fluids, this improved and by day of discharge, sodium up to 133. Felt to be secondary to dehydration.    Leukocytosis: Resolved. Felt to be secondary to aspiration pneumonia.    Anemia: Stable. Patient's initial hemoglobin was 9.4 and with hydration dropped to 8.7, but since that time has increased by itself without additional transfusion. Day of discharge, hemoglobin 9.2. Underlying anemia likely secondary from chronic disease.    Acute renal failure patient has baseline renal failure, likely from diabetes and hypertension. Based on creatinine is around 1.3. On admission, patient has been diuresed and her creatinine has started to trend up, by day of discharge is at 1.6. At this point stopping IV diuretics. Recommendations for followup basic metabolic panel on Monday 1/5.    Dementia without behavioral disturbance: Patient is overall stable. She has some decreased level of responsiveness when she was transferred to step down unit with increased respiratory distress. However, by 12/29, she is back at her baseline, interactive and appropriate.    Acute respiratory  failure: Multifactorial secondary to aspiration pneumonia and volume overload and atrial fibrillation. Patient required transfer to step down unit on 12/28 for additional oxygen support and Cardizem drip for atrial fibrillation. She was able to transfer out of step down unit by 12/30. Now resolved. Patient comfortable on room air.  Uncontrolled diabetes mellitus type 2 with renal dysfunction: Patient's home medication list did not list any medications for diabetes. Is unclear when this was discontinued. A1c was checked and found to be mildly elevated at 7.2. Have started patient on glipizide 2.5 mg extended release daily. Would not recommend metformin given patient's diastolic heart failure.   Procedures:  None  Consultations:  None  Discharge Exam: Filed Vitals:   05/31/13 0424  BP: 111/57  Pulse: 98  Temp: 98.2 F (36.8 C)  Resp: 18    General: Alert and oriented x2, no acute distress Cardiovascular: Irregular rhythm, rate controlled Respiratory: Mostly clear auscultation bilaterally Abdomen: Soft, nontender, nondistended, positive bowel sounds Extremities: No clubbing or cyanosis or edema  Discharge Instructions  Discharge Orders   Future Orders Complete By Expires   DIET DYS 3  As directed    Increase activity slowly  As directed        Medication List    STOP taking these medications       diltiazem 60 MG tablet  Commonly known as:  CARDIZEM  Replaced by:  diltiazem 90 MG 12 hr  capsule      TAKE these medications       albuterol 108 (90 BASE) MCG/ACT inhaler  Commonly known as:  PROVENTIL HFA;VENTOLIN HFA  Inhale 2 puffs into the lungs every 6 (six) hours as needed for wheezing or shortness of breath.     budesonide 180 MCG/ACT inhaler  Commonly known as:  PULMICORT  Inhale 1 puff into the lungs 2 (two) times daily.     calcium carbonate 600 MG Tabs tablet  Commonly known as:  OS-CAL  Take 600 mg by mouth 2 (two) times daily with a meal.     diltiazem  90 MG 12 hr capsule  Commonly known as:  CARDIZEM SR  Take 2 capsules (180 mg total) by mouth every 12 (twelve) hours.     ENSURE  Take 237 mLs by mouth 3 (three) times daily between meals.     furosemide 20 MG tablet  Commonly known as:  LASIX  20 mg po bid x 5 days, then 20mg  po daily.     glipiZIDE 2.5 MG 24 hr tablet  Commonly known as:  GLUCOTROL XL  Take 1 tablet (2.5 mg total) by mouth daily with breakfast.     guaiFENesin 600 MG 12 hr tablet  Commonly known as:  MUCINEX  Take 1 tablet (600 mg total) by mouth 2 (two) times daily.     HYDROcodone-acetaminophen 5-325 MG per tablet  Commonly known as:  NORCO  Take 1 tablet by mouth every 6 (six) hours as needed for pain.     levofloxacin 500 MG tablet  Commonly known as:  LEVAQUIN  Take 1 tablet (500 mg total) by mouth daily.     mirtazapine 15 MG tablet  Commonly known as:  REMERON  Take 15 mg by mouth at bedtime.     pantoprazole 40 MG tablet  Commonly known as:  PROTONIX  Take 40 mg by mouth every morning.     polyethylene glycol powder powder  Commonly known as:  GLYCOLAX/MIRALAX  Take 17 g by mouth every morning.     potassium chloride 10 MEQ tablet  Commonly known as:  K-DUR  Take 1 tablet (10 mEq total) by mouth daily.     sennosides-docusate sodium 8.6-50 MG tablet  Commonly known as:  SENOKOT-S  Take 1 tablet by mouth every morning.     therapeutic multivitamin-minerals tablet  Take 1 tablet by mouth every morning.     traMADol 50 MG tablet  Commonly known as:  ULTRAM  Take 1 tablet (50 mg total) by mouth every 8 (eight) hours as needed for pain. pain     Vitamin D-3 5000 UNITS Tabs  Take 1 tablet by mouth every morning.        Allergies  Allergen Reactions  . Aspirin Other (See Comments)    Unknown   . Indapamide Other (See Comments)    Unknown   . Metformin And Related Other (See Comments)    Unknown   . Penicillins Other (See Comments)    Unknown   . Sulfa Antibiotics Other  (See Comments)    Unknown   . Tiotropium Bromide Monohydrate Other (See Comments)    Unknown        Follow-up Information   Follow up with Thayer Headings, MD In 2 weeks.   Specialty:  Internal Medicine   Contact information:   [address] [place] [address] [phone]        The results of significant diagnostics from  this hospitalization (including imaging, microbiology, ancillary and laboratory) are listed below for reference.    Significant Diagnostic Studies: Dg Chest 1 View  05/29/2013    IMPRESSION: Patchy infiltrates in the left mid and right lower lung zones are increasing since previous study. Developing pneumonia should be considered. Stable chronic changes in the chest.   Electronically Signed   By: Burman Nieves M.D.   On: 05/29/2013 05:56   Dg Chest 2 View  05/27/2013    IMPRESSION: Potential subtle left perihilar mid lung infiltrate superimposed on significant chronic lung disease.   Electronically Signed   By: Irish Lack M.D.   On: 05/27/2013 15:20   Dg Swallowing Func-speech Pathology  05/29/2013  Recommend patient advance to a dysphagia 3 (for energy  conservation and pharyngeal clearance) diet with thin liquids.    Treatment Recommendation    Liquid Administration via: Cup; Straw (straw ok to facilitate use  of chin tuck) Medication Administration: Via alternative means  Supervision: Patient able to self feed;Full supervision/cueing  for compensatory strategies  Compensations: Slow rate;Small sips/bites  Postural Changes and/or Swallow Maneuvers: Seated upright 90  Degrees; Chin tuck (ok to use straws to facilitate chin tuck)     Other  Recommendations Recommended Consults: MBS  Follow Up   Frequency and Duration min 2x/week  2 weeks        Microbiology: Recent Results (from the past 240 hour(s))  CULTURE, BLOOD (ROUTINE X 2)     Status: None   Collection Time    05/27/13  4:10 PM      Result Value Range Status    Specimen Description BLOOD RIGHT ANTECUBITAL   Final   Special Requests BOTTLES DRAWN AEROBIC ONLY 4CC   Final   Culture  Setup Time     Final   Value: 05/27/2013 20:15     Performed at [facility]   Culture     Final   Value:        BLOOD CULTURE RECEIVED NO GROWTH TO DATE CULTURE WILL BE HELD FOR 5 DAYS BEFORE ISSUING A FINAL NEGATIVE REPORT     Performed at [facility]   Report Status PENDING   Incomplete  CULTURE, BLOOD (ROUTINE X 2)     Status: None   Collection Time    05/27/13  4:15 PM      Result Value Range Status   Specimen Description BLOOD RIGHT FOREARM   Final   Special Requests     Final   Value: BOTTLES DRAWN AEROBIC AND ANAEROBIC 4CC AEROBIC, 3 CC ANAEROBIC   Culture  Setup Time     Final   Value: 05/27/2013 20:14     Performed at [facility]   Culture     Final   Value:        BLOOD CULTURE RECEIVED NO GROWTH TO DATE CULTURE WILL BE HELD FOR 5 DAYS BEFORE ISSUING A FINAL NEGATIVE REPORT     Performed at [facility]   Report Status PENDING   Incomplete  URINE CULTURE     Status: None   Collection Time    05/27/13  7:10 PM      Result Value Range Status   Specimen Description URINE, RANDOM   Final   Special Requests NONE   Final   Culture  Setup Time     Final   Value: 05/28/2013 03:33     Performed at [facility] Count  Final   Value: NO GROWTH     Performed at [facility]   Culture     Final   Value: NO GROWTH     Performed at [facility]   Report Status 05/29/2013 FINAL   Final  MRSA PCR SCREENING     Status: None   Collection Time    05/27/13  7:10 PM      Result Value Range Status   MRSA by PCR NEGATIVE  NEGATIVE Final   Comment:            The GeneXpert MRSA Assay (FDA     approved for NASAL specimens     only), is one component of a     comprehensive MRSA colonization     surveillance program. It is not     intended to diagnose MRSA     infection nor to guide or      monitor treatment for     MRSA infections.  CULTURE, EXPECTORATED SPUTUM-ASSESSMENT     Status: None   Collection Time    05/27/13 10:35 PM      Result Value Range Status   Specimen Description SPUTUM   Final   Special Requests NONE   Final   Sputum evaluation     Final   Value: MICROSCOPIC FINDINGS SUGGEST THAT THIS SPECIMEN IS NOT REPRESENTATIVE OF LOWER RESPIRATORY SECRETIONS. PLEASE RECOLLECT.     CALLED CAUGHRON,D/4E @0125  ON 05/28/13 BY KARCZEWSKI,S.   Report Status 05/28/2013 FINAL   Final  CULTURE, EXPECTORATED SPUTUM-ASSESSMENT     Status: None   Collection Time    05/28/13  3:44 AM      Result Value Range Status   Specimen Description SPU   Final   Special Requests Normal   Final   Sputum evaluation     Final   Value: THIS SPECIMEN IS ACCEPTABLE. RESPIRATORY CULTURE REPORT TO FOLLOW.   Report Status 05/28/2013 FINAL   Final  CULTURE, RESPIRATORY (NON-EXPECTORATED)     Status: None   Collection Time    05/28/13  3:44 AM      Result Value Range Status   Specimen Description SPUTUM   Final   Special Requests NONE   Final   Gram Stain     Final   Value: ABUNDANT WBC PRESENT, PREDOMINANTLY PMN     NO SQUAMOUS EPITHELIAL CELLS SEEN     FEW GRAM POSITIVE COCCI     IN PAIRS RARE GRAM POSITIVE RODS     Performed at [facility]   Culture     Final   Value: NORMAL OROPHARYNGEAL FLORA     Performed at [facility]   Report Status 05/30/2013 FINAL   Final     Labs: Basic Metabolic Panel:  Recent Labs Lab 05/27/13 1446 05/27/13 1735 05/28/13 0527 05/29/13 0623 05/30/13 0350 05/31/13 0549  NA 128* 132* 132* 133* 134* 133*  K 4.4 4.8 4.4 4.6 3.9 4.8  CL 96 101 101 97 94* 94*  CO2 21 23 22 26 29 30   GLUCOSE 357* 278* 320* 216* 234* 181*  BUN 38* 37* 42* 46* 47* 47*  CREATININE 1.47* 1.45* 1.47* 1.47* 1.52* 1.60*  CALCIUM 9.2 9.4 9.2 9.3 9.1 9.3  MG  --  1.7  --   --   --   --   PHOS  --  2.3  --   --   --   --    Liver Function Tests:  Recent  Labs Lab 05/27/13 1735  AST 19  ALT 13  ALKPHOS 97  BILITOT 0.4  PROT 7.8  ALBUMIN 3.1*   No results found for this basename: LIPASE, AMYLASE,  in the last 168 hours No results found for this basename: AMMONIA,  in the last 168 hours CBC:  Recent Labs Lab 05/27/13 1446 05/27/13 2013 05/28/13 0527 05/29/13 0623 05/30/13 0350 05/31/13 0549  WBC 14.5* 12.9* 11.7* 12.6* 10.2 10.2  NEUTROABS 11.2* 12.4*  --   --   --   --   HGB 9.8* 9.4* 8.7* 9.5* 9.2* 9.2*  HCT 30.0* 28.9* 26.2* 28.7* 28.2* 29.1*  MCV 91.5 91.2 90.7 91.1 91.0 91.5  PLT 220 211 207 230 244 234   Cardiac Enzymes: No results found for this basename: CKTOTAL, CKMB, CKMBINDEX, TROPONINI,  in the last 168 hours BNP: BNP (last 3 results)  Recent Labs  05/28/13 0527 05/30/13 0350  PROBNP 4043.0* 3363.0*   CBG:  Recent Labs Lab 05/30/13 1159 05/30/13 1653 05/30/13 1710 05/30/13 2330 05/31/13 0729  GLUCAP 299* 250* 241* 135* 166*       Signed:  Cyana Shook K  [facility] 05/31/2013, 9:07 AM

## 2013-05-31 NOTE — Care Management Note (Signed)
    Page 1 of 1   05/31/2013     12:45:59 PM   CARE MANAGEMENT NOTE 05/31/2013  Patient:  Lori [name], Lori [name]   Account Number:  [id number]  Date Initiated:  05/29/2013  Documentation initiated by:  DAVIS,RHONDA  Subjective/Objective Assessment:   pt with confirmed pna, a.fib with rvr, admitted for cardiac problem and poss aspiration pna.     Action/Plan:   lives at the [facility]   Anticipated DC Date:  05/31/2013   Anticipated DC Plan:  SKILLED NURSING FACILITY  In-house referral  Clinical Social Worker         Choice offered to / List presented to:             Status of service:  Completed, signed off Medicare Important Message given?  NA - LOS <3 / Initial given by admissions (If response is "NO", the following Medicare IM given date fields will be blank) Date Medicare IM given:   Date Additional Medicare IM given:    Discharge Disposition:  SKILLED NURSING FACILITY  Per UR Regulation:  Reviewed for med. necessity/level of care/duration of stay  If discussed at Long Length of Stay Meetings, dates discussed:    Comments:  05/31/13 Sascha Baugher RN,BSN NCM [phone] TRANSFERRED FROM [email or web address].  [id number] Earlene Plater RN, BSN, [place] [phone] Chart Reviewed for discharge and hospital needs. Discharge needs at time of review:  None present will follow for needs. Review of patient progress due on [id number].

## 2013-05-31 NOTE — Progress Notes (Signed)
Patient's son left his phone number where he may be reached.  Phone: [phone]

## 2013-06-02 LAB — CULTURE, BLOOD (ROUTINE X 2)
Culture: NO GROWTH
Culture: NO GROWTH

## 2013-09-15 ENCOUNTER — Emergency Department (HOSPITAL_COMMUNITY): Payer: Medicare Other

## 2013-09-15 ENCOUNTER — Inpatient Hospital Stay (HOSPITAL_COMMUNITY)
Admission: EM | Admit: 2013-09-15 | Discharge: 2013-09-19 | DRG: 481 | Disposition: A | Discharge status: Skilled Nursing Facility | Payer: Medicare Other | Attending: Internal Medicine | Admitting: Internal Medicine

## 2013-09-15 DIAGNOSIS — E1165 Type 2 diabetes mellitus with hyperglycemia: Secondary | ICD-10-CM | POA: Diagnosis present

## 2013-09-15 DIAGNOSIS — K5900 Constipation, unspecified: Secondary | ICD-10-CM

## 2013-09-15 DIAGNOSIS — I4891 Unspecified atrial fibrillation: Secondary | ICD-10-CM | POA: Diagnosis present

## 2013-09-15 DIAGNOSIS — W010XXA Fall on same level from slipping, tripping and stumbling without subsequent striking against object, initial encounter: Secondary | ICD-10-CM | POA: Diagnosis present

## 2013-09-15 DIAGNOSIS — I10 Essential (primary) hypertension: Secondary | ICD-10-CM

## 2013-09-15 DIAGNOSIS — N058 Unspecified nephritic syndrome with other morphologic changes: Secondary | ICD-10-CM | POA: Diagnosis present

## 2013-09-15 DIAGNOSIS — N179 Acute kidney failure, unspecified: Secondary | ICD-10-CM

## 2013-09-15 DIAGNOSIS — S72002A Fracture of unspecified part of neck of left femur, initial encounter for closed fracture: Secondary | ICD-10-CM

## 2013-09-15 DIAGNOSIS — I5032 Chronic diastolic (congestive) heart failure: Secondary | ICD-10-CM | POA: Diagnosis present

## 2013-09-15 DIAGNOSIS — E785 Hyperlipidemia, unspecified: Secondary | ICD-10-CM | POA: Diagnosis present

## 2013-09-15 DIAGNOSIS — Z7901 Long term (current) use of anticoagulants: Secondary | ICD-10-CM

## 2013-09-15 DIAGNOSIS — J441 Chronic obstructive pulmonary disease with (acute) exacerbation: Secondary | ICD-10-CM

## 2013-09-15 DIAGNOSIS — J690 Pneumonitis due to inhalation of food and vomit: Secondary | ICD-10-CM

## 2013-09-15 DIAGNOSIS — Z87891 Personal history of nicotine dependence: Secondary | ICD-10-CM

## 2013-09-15 DIAGNOSIS — F0390 Unspecified dementia without behavioral disturbance: Secondary | ICD-10-CM | POA: Diagnosis present

## 2013-09-15 DIAGNOSIS — K219 Gastro-esophageal reflux disease without esophagitis: Secondary | ICD-10-CM

## 2013-09-15 DIAGNOSIS — Y9301 Activity, walking, marching and hiking: Secondary | ICD-10-CM

## 2013-09-15 DIAGNOSIS — N183 Chronic kidney disease, stage 3 unspecified: Secondary | ICD-10-CM | POA: Diagnosis present

## 2013-09-15 DIAGNOSIS — D72829 Elevated white blood cell count, unspecified: Secondary | ICD-10-CM

## 2013-09-15 DIAGNOSIS — G629 Polyneuropathy, unspecified: Secondary | ICD-10-CM

## 2013-09-15 DIAGNOSIS — I129 Hypertensive chronic kidney disease with stage 1 through stage 4 chronic kidney disease, or unspecified chronic kidney disease: Secondary | ICD-10-CM | POA: Diagnosis present

## 2013-09-15 DIAGNOSIS — E1129 Type 2 diabetes mellitus with other diabetic kidney complication: Secondary | ICD-10-CM | POA: Diagnosis present

## 2013-09-15 DIAGNOSIS — E871 Hypo-osmolality and hyponatremia: Secondary | ICD-10-CM

## 2013-09-15 DIAGNOSIS — Y9210 Unspecified residential institution as the place of occurrence of the external cause: Secondary | ICD-10-CM | POA: Diagnosis present

## 2013-09-15 DIAGNOSIS — Z79899 Other long term (current) drug therapy: Secondary | ICD-10-CM

## 2013-09-15 DIAGNOSIS — Z96649 Presence of unspecified artificial hip joint: Secondary | ICD-10-CM

## 2013-09-15 DIAGNOSIS — G609 Hereditary and idiopathic neuropathy, unspecified: Secondary | ICD-10-CM | POA: Diagnosis present

## 2013-09-15 DIAGNOSIS — I5031 Acute diastolic (congestive) heart failure: Secondary | ICD-10-CM

## 2013-09-15 DIAGNOSIS — S72141A Displaced intertrochanteric fracture of right femur, initial encounter for closed fracture: Secondary | ICD-10-CM | POA: Diagnosis present

## 2013-09-15 DIAGNOSIS — J9600 Acute respiratory failure, unspecified whether with hypoxia or hypercapnia: Secondary | ICD-10-CM

## 2013-09-15 DIAGNOSIS — D62 Acute posthemorrhagic anemia: Secondary | ICD-10-CM

## 2013-09-15 DIAGNOSIS — Z9181 History of falling: Secondary | ICD-10-CM

## 2013-09-15 DIAGNOSIS — D649 Anemia, unspecified: Secondary | ICD-10-CM | POA: Diagnosis present

## 2013-09-15 DIAGNOSIS — S72143A Displaced intertrochanteric fracture of unspecified femur, initial encounter for closed fracture: Principal | ICD-10-CM | POA: Diagnosis present

## 2013-09-15 DIAGNOSIS — E1149 Type 2 diabetes mellitus with other diabetic neurological complication: Secondary | ICD-10-CM | POA: Diagnosis present

## 2013-09-15 DIAGNOSIS — IMO0002 Reserved for concepts with insufficient information to code with codable children: Secondary | ICD-10-CM | POA: Diagnosis present

## 2013-09-15 LAB — CBC WITH DIFFERENTIAL/PLATELET
BASOS PCT: 0 % (ref 0–1)
Basophils Absolute: 0 10*3/uL (ref 0.0–0.1)
Eosinophils Absolute: 0.1 10*3/uL (ref 0.0–0.7)
Eosinophils Relative: 1 % (ref 0–5)
HEMATOCRIT: 29.2 % — AB (ref 36.0–46.0)
HEMOGLOBIN: 9.3 g/dL — AB (ref 12.0–15.0)
LYMPHS ABS: 1.9 10*3/uL (ref 0.7–4.0)
Lymphocytes Relative: 21 % (ref 12–46)
MCH: 28.3 pg (ref 26.0–34.0)
MCHC: 31.8 g/dL (ref 30.0–36.0)
MCV: 88.8 fL (ref 78.0–100.0)
Monocytes Absolute: 0.7 10*3/uL (ref 0.1–1.0)
Monocytes Relative: 8 % (ref 3–12)
Neutro Abs: 6.5 10*3/uL (ref 1.7–7.7)
Neutrophils Relative %: 70 % (ref 43–77)
Platelets: 262 10*3/uL (ref 150–400)
RBC: 3.29 MIL/uL — ABNORMAL LOW (ref 3.87–5.11)
RDW: 13.5 % (ref 11.5–15.5)
WBC: 9.2 10*3/uL (ref 4.0–10.5)

## 2013-09-15 LAB — BASIC METABOLIC PANEL
BUN: 37 mg/dL — ABNORMAL HIGH (ref 6–23)
CO2: 24 mEq/L (ref 19–32)
Calcium: 9.5 mg/dL (ref 8.4–10.5)
Chloride: 105 mEq/L (ref 96–112)
Creatinine, Ser: 1.43 mg/dL — ABNORMAL HIGH (ref 0.50–1.10)
GFR calc Af Amer: 36 mL/min — ABNORMAL LOW (ref >90)
GFR calc non Af Amer: 31 mL/min — ABNORMAL LOW (ref >90)
GLUCOSE: 169 mg/dL — AB (ref 70–99)
POTASSIUM: 4.7 meq/L (ref 3.7–5.3)
Sodium: 138 mEq/L (ref 137–147)

## 2013-09-15 LAB — PROTIME-INR
INR: 1.02 (ref 0.00–1.49)
Prothrombin Time: 13.2 seconds (ref 11.6–15.2)

## 2013-09-15 MED ORDER — ALBUTEROL SULFATE HFA 108 (90 BASE) MCG/ACT IN AERS: 2.0000 | INHALATION_SPRAY | Freq: Four times a day (QID) | RESPIRATORY_TRACT | Status: DC | PRN

## 2013-09-15 MED ORDER — HYDROMORPHONE HCL PF 1 MG/ML IJ SOLN
0.5000 mg | INTRAMUSCULAR | Status: DC | PRN
Start: 2013-09-15 — End: 2013-09-19
  Administered 2013-09-16: 0.5 mg via INTRAVENOUS
  Filled 2013-09-15: qty 1

## 2013-09-15 MED ORDER — MORPHINE SULFATE 2 MG/ML IJ SOLN
0.5000 mg | INTRAMUSCULAR | Status: DC | PRN
  Administered 2013-09-16 (×4): 0.5 mg via INTRAVENOUS
  Filled 2013-09-15 (×4): qty 1

## 2013-09-15 MED ORDER — MIRTAZAPINE 15 MG PO TABS
15.0000 mg | ORAL_TABLET | Freq: Every day | ORAL | Status: DC
  Administered 2013-09-16 – 2013-09-18 (×4): 15 mg via ORAL
  Filled 2013-09-15 (×5): qty 1

## 2013-09-15 MED ORDER — SODIUM CHLORIDE 0.9 % IV SOLN
INTRAVENOUS | Status: DC
  Administered 2013-09-16 (×3): via INTRAVENOUS

## 2013-09-15 MED ORDER — FLUTICASONE PROPIONATE HFA 44 MCG/ACT IN AERO
2.0000 | INHALATION_SPRAY | Freq: Two times a day (BID) | RESPIRATORY_TRACT | Status: DC
Start: 2013-09-15 — End: 2013-09-19
  Administered 2013-09-16 – 2013-09-18 (×2): 2 via RESPIRATORY_TRACT
  Filled 2013-09-15 (×3): qty 10.6

## 2013-09-15 MED ORDER — HYDROCODONE-ACETAMINOPHEN 5-325 MG PO TABS
1.0000 | ORAL_TABLET | Freq: Four times a day (QID) | ORAL | Status: DC | PRN
  Administered 2013-09-16 – 2013-09-19 (×7): 1 via ORAL
  Filled 2013-09-15 (×2): qty 1
  Filled 2013-09-15: qty 2
  Filled 2013-09-15 (×4): qty 1

## 2013-09-15 MED ORDER — DILTIAZEM HCL 90 MG PO TABS
180.0000 mg | ORAL_TABLET | Freq: Two times a day (BID) | ORAL | Status: DC
  Administered 2013-09-16 – 2013-09-19 (×7): 180 mg via ORAL
  Filled 2013-09-15 (×11): qty 2

## 2013-09-15 MED ORDER — ZOLPIDEM TARTRATE 5 MG PO TABS: 5.0000 mg | ORAL_TABLET | Freq: Every evening | ORAL | Status: DC | PRN

## 2013-09-15 MED ORDER — CALCIUM CARBONATE 1250 (500 CA) MG PO TABS
1250.0000 mg | ORAL_TABLET | Freq: Two times a day (BID) | ORAL | Status: DC
  Administered 2013-09-17 – 2013-09-19 (×6): 1250 mg via ORAL
  Filled 2013-09-15 (×10): qty 1

## 2013-09-15 MED ORDER — PANTOPRAZOLE SODIUM 40 MG PO TBEC
40.0000 mg | DELAYED_RELEASE_TABLET | Freq: Every morning | ORAL | Status: DC
  Administered 2013-09-17 – 2013-09-19 (×3): 40 mg via ORAL
  Filled 2013-09-15 (×3): qty 1

## 2013-09-15 MED ORDER — VITAMIN D 1000 UNITS PO TABS
5000.0000 [IU] | ORAL_TABLET | Freq: Every morning | ORAL | Status: DC
  Administered 2013-09-17 – 2013-09-19 (×3): 5000 [IU] via ORAL
  Filled 2013-09-15 (×4): qty 5

## 2013-09-15 MED ORDER — SENNOSIDES-DOCUSATE SODIUM 8.6-50 MG PO TABS
1.0000 | ORAL_TABLET | Freq: Every morning | ORAL | Status: DC
  Administered 2013-09-17 – 2013-09-19 (×3): 1 via ORAL
  Filled 2013-09-15 (×4): qty 1

## 2013-09-15 MED ORDER — INSULIN ASPART 100 UNIT/ML ~~LOC~~ SOLN
0.0000 [IU] | SUBCUTANEOUS | Status: DC
  Administered 2013-09-16 – 2013-09-17 (×4): 2 [IU] via SUBCUTANEOUS
  Filled 2013-09-15 (×2): qty 1

## 2013-09-15 MED ORDER — ONDANSETRON HCL 4 MG/2ML IJ SOLN: 4.0000 mg | Freq: Once | INTRAMUSCULAR | Status: DC

## 2013-09-15 MED ORDER — GUAIFENESIN ER 600 MG PO TB12
600.0000 mg | ORAL_TABLET | Freq: Two times a day (BID) | ORAL | Status: DC
  Administered 2013-09-16 – 2013-09-19 (×7): 600 mg via ORAL
  Filled 2013-09-15 (×10): qty 1

## 2013-09-15 MED ORDER — POLYETHYLENE GLYCOL 3350 17 G PO PACK
17.0000 g | PACK | Freq: Every morning | ORAL | Status: DC
  Administered 2013-09-17 – 2013-09-19 (×3): 17 g via ORAL
  Filled 2013-09-15 (×4): qty 1

## 2013-09-15 NOTE — ED Notes (Signed)
[facility]. Patient's son notified.

## 2013-09-15 NOTE — ED Provider Notes (Signed)
CSN: [id number]     Arrival date & time 09/15/13  2106 History   First MD Initiated Contact with Patient 09/15/13 2113     Chief Complaint  Patient presents with  . Hip Pain    Right     (Consider location/radiation/quality/duration/timing/severity/associated sxs/prior Treatment) HPI Comments: Patient with history of left hip fracture (repaired 07/2012, Dr. Ophelia Charter), diabetes, A. fib not currently on anticoagulation 2/2 gastric AVM per records -- presents with complaint of right hip pain after a fall. Patient states that she fell while walking with her walker. She is uncertain as to why this happened. Patient was transferred by EMS. She denies hitting her head. She denies other complaints. Pain is currently controlled after the patient received fentanyl by EMS. The onset of this condition was acute. The course is constant. Aggravating factors: none. Alleviating factors: none.    Patient is a 80 y.o. female presenting with hip pain. The history is provided by the patient and medical records.  Hip Pain Associated symptoms include arthralgias. Pertinent negatives include no abdominal pain, chest pain, coughing, fever, headaches, myalgias, nausea, rash, sore throat or vomiting.    Past Medical History  Diagnosis Date  . Diabetes mellitus   . Hypertension   . Atrial fibrillation   . Hyperlipidemia   . Peripheral neuropathy   . Gastric AVM   . GERD (gastroesophageal reflux disease)   . Diabetic Charcot foot   . Shortness of breath    Past Surgical History  Procedure Laterality Date  . Tonsillectomy    . Lung removal, partial    . Thyroidectomy, partial    . Hip arthroplasty Left 08/18/2012    Procedure: ARTHROPLASTY BIPOLAR HIP;  Surgeon: Eldred Manges, MD;  Location: [facility];  Service: Orthopedics;  Laterality: Left;   Family History  Problem Relation Age of Onset  . Cancer Mother 62    melanoma  . Aneurysm Father     stomach aneurysm   History  Substance Use Topics  . Smoking  status: Former Smoker -- 0.50 packs/day for 10 years    Types: Cigarettes    Quit date: 04/28/2002  . Smokeless tobacco: Not on file  . Alcohol Use: No   OB History   Grav Para Term Preterm Abortions TAB SAB Ect Mult Living                 Review of Systems  Constitutional: Negative for fever.  HENT: Negative for rhinorrhea and sore throat.   Eyes: Negative for redness.  Respiratory: Negative for cough.   Cardiovascular: Negative for chest pain.  Gastrointestinal: Negative for nausea, vomiting, abdominal pain and diarrhea.  Genitourinary: Negative for dysuria.  Musculoskeletal: Positive for arthralgias. Negative for myalgias.  Skin: Negative for rash.  Neurological: Negative for dizziness, syncope, light-headedness and headaches.      Allergies  Aspirin; Indapamide; Metformin and related; Penicillins; Sulfa antibiotics; and Tiotropium bromide monohydrate  Home Medications   Prior to Admission medications   Medication Sig Start Date End Date Taking? Authorizing Provider  albuterol (PROVENTIL HFA;VENTOLIN HFA) 108 (90 BASE) MCG/ACT inhaler Inhale 2 puffs into the lungs every 6 (six) hours as needed for wheezing or shortness of breath. 05/31/13   Hollice Espy, MD  budesonide (PULMICORT) 180 MCG/ACT inhaler Inhale 1 puff into the lungs 2 (two) times daily.    Historical Provider, MD  calcium carbonate (OS-CAL) 600 MG TABS Take 600 mg by mouth 2 (two) times daily with a meal.    Historical  Provider, MD  Cholecalciferol (VITAMIN D-3) 5000 UNITS TABS Take 1 tablet by mouth every morning.     Historical Provider, MD  diltiazem (CARDIZEM SR) 90 MG 12 hr capsule Take 2 capsules (180 mg total) by mouth every 12 (twelve) hours. 05/31/13   Hollice EspySendil K Krishnan, MD  ENSURE (ENSURE) Take 237 mLs by mouth 3 (three) times daily between meals.    Historical Provider, MD  furosemide (LASIX) 20 MG tablet 20 mg po bid x 5 days, then 20mg  po daily. 05/31/13   Hollice EspySendil K Krishnan, MD  glipiZIDE  (GLUCOTROL XL) 2.5 MG 24 hr tablet Take 1 tablet (2.5 mg total) by mouth daily with breakfast. 05/31/13   Hollice EspySendil K Krishnan, MD  guaiFENesin (MUCINEX) 600 MG 12 hr tablet Take 1 tablet (600 mg total) by mouth 2 (two) times daily. 08/22/12   Dorothea OgleIskra M Myers, MD  HYDROcodone-acetaminophen (NORCO) 5-325 MG per tablet Take 1 tablet by mouth every 6 (six) hours as needed for pain. 11/14/12   Celene KrasJon R Knapp, MD  mirtazapine (REMERON) 15 MG tablet Take 15 mg by mouth at bedtime.    Historical Provider, MD  pantoprazole (PROTONIX) 40 MG tablet Take 40 mg by mouth every morning. 08/06/11 08/05/13  Antonieta PertSosan J Abdullah, MD  polyethylene glycol powder (GLYCOLAX/MIRALAX) powder Take 17 g by mouth every morning.    Historical Provider, MD  potassium chloride (K-DUR) 10 MEQ tablet Take 1 tablet (10 mEq total) by mouth daily. 08/06/11 05/27/13  Antonieta PertSosan J Abdullah, MD  sennosides-docusate sodium (SENOKOT-S) 8.6-50 MG tablet Take 1 tablet by mouth every morning.    Historical Provider, MD  therapeutic multivitamin-minerals (THERAGRAN-M) tablet Take 1 tablet by mouth every morning.    Historical Provider, MD  traMADol (ULTRAM) 50 MG tablet Take 1 tablet (50 mg total) by mouth every 8 (eight) hours as needed for pain. pain 11/14/12   Celene KrasJon R Knapp, MD   BP 144/66  Pulse 85  Temp(Src) 97.8 F (36.6 C) (Oral)  Resp 22  SpO2 100%  LMP 07/29/1960 Physical Exam  Nursing note and vitals reviewed. Constitutional: She appears well-developed and well-nourished.  HENT:  Head: Normocephalic and atraumatic.  Eyes: Conjunctivae are normal. Pupils are equal, round, and reactive to light. Right eye exhibits no discharge. Left eye exhibits no discharge.  Neck: Normal range of motion. Neck supple.  Cardiovascular: Normal rate and normal heart sounds.  An irregular rhythm present. Exam reveals no decreased pulses.   No murmur heard. Pulses:      Dorsalis pedis pulses are 2+ on the right side, and 2+ on the left side.       Posterior tibial  pulses are 2+ on the right side, and 2+ on the left side.  Pulmonary/Chest: Effort normal and breath sounds normal. No respiratory distress. She has no wheezes. She has no rales.  Abdominal: Soft. There is no tenderness.  Musculoskeletal: She exhibits tenderness. She exhibits no edema.  R LE foreshortened and externally rotated.   Neurological: She is alert. No sensory deficit.  Motor, sensation, and vascular distal to the injury is fully intact.   Skin: Skin is warm and dry.  Psychiatric: She has a normal mood and affect.    ED Course  Procedures (including critical care time) Labs Review Labs Reviewed  BASIC METABOLIC PANEL - Abnormal; Notable for the following:    Glucose, Bld 169 (*)    BUN 37 (*)    Creatinine, Ser 1.43 (*)    GFR calc non Af [place]  31 (*)    GFR calc Af Amer 36 (*)    All other components within normal limits  CBC WITH DIFFERENTIAL - Abnormal; Notable for the following:    RBC 3.29 (*)    Hemoglobin 9.3 (*)    HCT 29.2 (*)    All other components within normal limits  PROTIME-INR  TYPE AND SCREEN    Imaging Review Dg Chest 1 View  09/15/2013   CLINICAL DATA:  Right hip pain after a fall. Shortness of breath due to lung cancer history and partial lung removal.  EXAM: CHEST - 1 VIEW  COMPARISON:  05/29/2013  FINDINGS: Borderline heart size with normal pulmonary vascularity. Emphysematous changes in the lungs with the perihilar interstitial changes likely due to chronic bronchitis. Previous patchy infiltrates have resolved. There is still some fluid or thickened pleura in the right costophrenic angle. Calcification of aorta. Old fracture deformity of the right shoulder.  IMPRESSION: Stable appearance of chronic changes in the chest. Interval resolution of previous patchy infiltrates. No active pulmonary disease.   Electronically Signed   By: Burman Nieves M.D.   On: 09/15/2013 22:16   Dg Hip Complete Right  09/15/2013   CLINICAL DATA:  Right hip pain after a  fall.  EXAM: RIGHT HIP - COMPLETE 2+ VIEW  COMPARISON:  DG HIP COMPLETE 2+V*R* dated 03/03/2010  FINDINGS: New comminuted inter trochanteric fractures of the right hip with varus angulation of fracture fragments. No dislocation. Pelvis appears intact. Interval placement of a left hip hemiarthroplasty. Vascular calcifications.  IMPRESSION: Comminuted inter trochanteric fractures of the right hip with varus angulation.   Electronically Signed   By: Burman Nieves M.D.   On: 09/15/2013 22:14   Ct Head Wo Contrast  09/15/2013   CLINICAL DATA:  Right hip pain after a fall.  EXAM: CT HEAD WITHOUT CONTRAST  TECHNIQUE: Contiguous axial images were obtained from the base of the skull through the vertex without intravenous contrast.  COMPARISON:  CT HEAD W/O CM dated 11/14/2012  FINDINGS: Diffuse cerebral atrophy. Ventricular dilatation consistent with central atrophy. Patchy low-attenuation changes in the deep white matter consistent with small vessel ischemia. No significant changes since previous study. No mass effect or midline shift. No abnormal extra-axial fluid collections. Gray-white matter junctions are distinct. Basal cisterns are not effaced. No evidence of acute intracranial hemorrhage. No depressed skull fractures. Visualized paranasal sinuses and mastoid air cells are not opacified.  IMPRESSION: No acute intracranial abnormalities. Chronic atrophy and small vessel ischemic changes.   Electronically Signed   By: Burman Nieves M.D.   On: 09/15/2013 22:06     EKG Interpretation   Date/Time:  Friday September 15 2013 21:33:16 EDT Ventricular Rate:  83 PR Interval:    QRS Duration: 74 QT Interval:  382 QTC Calculation: 449 R Axis:   54 Text Interpretation:  Atrial fibrillation Anterior infarct, old No  significant change since last tracing Confirmed by Anitra Lauth  MD, Alphonzo Lemmings  [phone]) on 09/15/2013 10:22:45 PM      9:38 PM Patient seen and examined. Work-up initiated. Medications ordered.   Vital  signs reviewed and are as follows: Filed Vitals:   09/15/13 2135  BP:   Pulse:   Temp: 97.8 F (36.6 C)  Resp:   BP 144/66  Pulse 85  Temp(Src) 97.8 F (36.6 C) (Oral)  Resp 22  SpO2 100%  LMP 07/29/1960  10:40 PM Pt seen by Dr. Anitra Lauth.   Spoke with Dr. Lajoyce Corners on-call for [facility]. He requests  [facility] if patient is cleared.   11:24 PM Spoke with Dr. Julian ReilGardner who will admit and arrange for transfer.   Patient currently sleeping.   MDM   Final diagnoses:  Intertrochanteric fracture of right hip   Admit for above.    Renne CriglerJoshua Shakhia Gramajo, PA-C 09/15/13 2326

## 2013-09-15 NOTE — ED Notes (Signed)
Per EMS, [facility], was transitioning with her walker, tripped and fell on walker. R hip pain, shortening and rotation noted. Patient given 50mg  Fentanyl IV with EMS.

## 2013-09-15 NOTE — ED Notes (Signed)
Patient's son Garry HeaterJohnny Bellevue was notified and updated on the patient's status.

## 2013-09-15 NOTE — H&P (Signed)
[facility] History and Physical  Lori [name] ION:[id number] DOB: 05-Nov-1920 DOA: 09/15/2013  Referring physician: EDP PCP: Thayer HeadingsMACKENZIE,BRIAN, MD   Chief Complaint: Fall, R hip pain   HPI: Lori [name] is a 80 y.o. female who presents to the ED after a mechanical fall that occurred in a facility.  She was walking with a walker, tripped, and fell on the walker.  R hip pain, R hip shortened and rotated.  Pain worse with movement, unable to bear weight.  Unfortunately patient has now fractured her R hip.  She has h/o frequent falls recently including L hip fracture in March of last year and a humerus fracture in June of last year.  Review of Systems: Systems reviewed.  As above, otherwise negative  Past Medical History  Diagnosis Date  . Diabetes mellitus   . Hypertension   . Atrial fibrillation   . Hyperlipidemia   . Peripheral neuropathy   . Gastric AVM   . GERD (gastroesophageal reflux disease)   . Diabetic Charcot foot   . Shortness of breath    Past Surgical History  Procedure Laterality Date  . Tonsillectomy    . Lung removal, partial    . Thyroidectomy, partial    . Hip arthroplasty Left 08/18/2012    Procedure: ARTHROPLASTY BIPOLAR HIP;  Surgeon: Eldred MangesMark C Yates, MD;  Location: [facility];  Service: Orthopedics;  Laterality: Left;   Social History:  reports that she quit smoking about 11 years ago. Her smoking use included Cigarettes. She has a 5 pack-year smoking history. She does not have any smokeless tobacco history on file. She reports that she does not drink alcohol or use illicit drugs.  Allergies  Allergen Reactions  . Aspirin Other (See Comments)    Unknown   . Indapamide Other (See Comments)    Unknown   . Metformin And Related Other (See Comments)    Unknown   . Penicillins Other (See Comments)    Unknown   . Sulfa Antibiotics Other (See Comments)    Unknown   . Tiotropium Bromide Monohydrate Other (See Comments)    Unknown     Family  History  Problem Relation Age of Onset  . Cancer Mother 3470    melanoma  . Aneurysm Father     stomach aneurysm     Prior to Admission medications   Medication Sig Start Date End Date Taking? Authorizing Provider  albuterol (PROVENTIL HFA;VENTOLIN HFA) 108 (90 BASE) MCG/ACT inhaler Inhale 2 puffs into the lungs every 6 (six) hours as needed for wheezing or shortness of breath. 05/31/13  Yes Hollice EspySendil K Krishnan, MD  beta carotene w/minerals (OCUVITE) tablet Take 1 tablet by mouth daily.   Yes Historical Provider, MD  budesonide (PULMICORT) 180 MCG/ACT inhaler Inhale 1 puff into the lungs 2 (two) times daily.   Yes Historical Provider, MD  calcium carbonate (OS-CAL) 600 MG TABS Take 600 mg by mouth 2 (two) times daily with a meal.   Yes Historical Provider, MD  Cholecalciferol (VITAMIN D-3) 5000 UNITS TABS Take 1 tablet by mouth every morning.    Yes Historical Provider, MD  diltiazem (CARDIZEM) 90 MG tablet Take 180 mg by mouth 2 (two) times daily.   Yes Historical Provider, MD  furosemide (LASIX) 20 MG tablet Take 20 mg by mouth daily.   Yes Historical Provider, MD  guaiFENesin (MUCINEX) 600 MG 12 hr tablet Take 1 tablet (600 mg total) by mouth 2 (two) times daily. 08/22/12  Yes Dorothea OgleIskra M Myers,  MD  HYDROcodone-acetaminophen (NORCO) 5-325 MG per tablet Take 1 tablet by mouth every 6 (six) hours as needed for pain. 11/14/12  Yes Celene Kras, MD  mirtazapine (REMERON) 15 MG tablet Take 15 mg by mouth at bedtime.   Yes Historical Provider, MD  pantoprazole (PROTONIX) 40 MG tablet Take 40 mg by mouth every morning. 08/06/11 08/06/14 Yes Sosan Forrestine Him, MD  polyethylene glycol powder (GLYCOLAX/MIRALAX) powder Take 17 g by mouth every morning.   Yes Historical Provider, MD  potassium chloride (K-DUR) 10 MEQ tablet Take 10 mEq by mouth daily. 08/06/11 05/27/14 Yes Sosan Forrestine Him, MD  sennosides-docusate sodium (SENOKOT-S) 8.6-50 MG tablet Take 1 tablet by mouth every morning.   Yes Historical Provider, MD   traMADol (ULTRAM) 50 MG tablet Take 1 tablet (50 mg total) by mouth every 8 (eight) hours as needed for pain. pain 11/14/12  Yes Celene Kras, MD  zolpidem (AMBIEN) 5 MG tablet Take 5 mg by mouth at bedtime as needed for sleep.   Yes Historical Provider, MD  ENSURE (ENSURE) Take 237 mLs by mouth 3 (three) times daily as needed (per patient request).     Historical Provider, MD   Physical Exam: Filed Vitals:   09/15/13 2135  BP:   Pulse:   Temp: 97.8 F (36.6 C)  Resp:     BP 144/66  Pulse 85  Temp(Src) 97.8 F (36.6 C) (Oral)  Resp 22  SpO2 100%  LMP 07/29/1960  General Appearance:    Alert, oriented, no distress, appears stated age  Head:    Normocephalic, atraumatic  Eyes:    PERRL, EOMI, sclera non-icteric        Nose:   Nares without drainage or epistaxis. Mucosa, turbinates normal  Throat:   Moist mucous membranes. Oropharynx without erythema or exudate.  Neck:   Supple. No carotid bruits.  No thyromegaly.  No lymphadenopathy.   Back:     No CVA tenderness, no spinal tenderness  Lungs:     Clear to auscultation bilaterally, without wheezes, rhonchi or rales  Chest wall:    No tenderness to palpitation  Heart:    Regular rate and rhythm without murmurs, gallops, rubs  Abdomen:     Soft, non-tender, nondistended, normal bowel sounds, no organomegaly  Genitalia:    deferred  Rectal:    deferred  Extremities:   No clubbing, cyanosis or edema.  Pulses:   2+ and symmetric all extremities  Skin:   Skin color, texture, turgor normal, no rashes or lesions  Lymph nodes:   Cervical, supraclavicular, and axillary nodes normal  Neurologic:   CNII-XII intact. Normal strength, sensation and reflexes      throughout    Labs on Admission:  Basic Metabolic Panel:  Recent Labs Lab 09/15/13 2230  NA 138  K 4.7  CL 105  CO2 24  GLUCOSE 169*  BUN 37*  CREATININE 1.43*  CALCIUM 9.5   Liver Function Tests: No results found for this basename: AST, ALT, ALKPHOS, BILITOT, PROT,  ALBUMIN,  in the last 168 hours No results found for this basename: LIPASE, AMYLASE,  in the last 168 hours No results found for this basename: AMMONIA,  in the last 168 hours CBC:  Recent Labs Lab 09/15/13 2230  WBC 9.2  NEUTROABS 6.5  HGB 9.3*  HCT 29.2*  MCV 88.8  PLT 262   Cardiac Enzymes: No results found for this basename: CKTOTAL, CKMB, CKMBINDEX, TROPONINI,  in the last 168 hours  BNP (  last 3 results)  Recent Labs  05/28/13 0527 05/30/13 0350  PROBNP 4043.0* 3363.0*   CBG: No results found for this basename: GLUCAP,  in the last 168 hours  Radiological Exams on Admission: Dg Chest 1 View  09/15/2013   CLINICAL DATA:  Right hip pain after a fall. Shortness of breath due to lung cancer history and partial lung removal.  EXAM: CHEST - 1 VIEW  COMPARISON:  05/29/2013  FINDINGS: Borderline heart size with normal pulmonary vascularity. Emphysematous changes in the lungs with the perihilar interstitial changes likely due to chronic bronchitis. Previous patchy infiltrates have resolved. There is still some fluid or thickened pleura in the right costophrenic angle. Calcification of aorta. Old fracture deformity of the right shoulder.  IMPRESSION: Stable appearance of chronic changes in the chest. Interval resolution of previous patchy infiltrates. No active pulmonary disease.   Electronically Signed   By: Burman Nieves M.D.   On: 09/15/2013 22:16   Dg Hip Complete Right  09/15/2013   CLINICAL DATA:  Right hip pain after a fall.  EXAM: RIGHT HIP - COMPLETE 2+ VIEW  COMPARISON:  DG HIP COMPLETE 2+V*R* dated 03/03/2010  FINDINGS: New comminuted inter trochanteric fractures of the right hip with varus angulation of fracture fragments. No dislocation. Pelvis appears intact. Interval placement of a left hip hemiarthroplasty. Vascular calcifications.  IMPRESSION: Comminuted inter trochanteric fractures of the right hip with varus angulation.   Electronically Signed   By: Burman Nieves  M.D.   On: 09/15/2013 22:14   Ct Head Wo Contrast  09/15/2013   CLINICAL DATA:  Right hip pain after a fall.  EXAM: CT HEAD WITHOUT CONTRAST  TECHNIQUE: Contiguous axial images were obtained from the base of the skull through the vertex without intravenous contrast.  COMPARISON:  CT HEAD W/O CM dated 11/14/2012  FINDINGS: Diffuse cerebral atrophy. Ventricular dilatation consistent with central atrophy. Patchy low-attenuation changes in the deep white matter consistent with small vessel ischemia. No significant changes since previous study. No mass effect or midline shift. No abnormal extra-axial fluid collections. Gray-white matter junctions are distinct. Basal cisterns are not effaced. No evidence of acute intracranial hemorrhage. No depressed skull fractures. Visualized paranasal sinuses and mastoid air cells are not opacified.  IMPRESSION: No acute intracranial abnormalities. Chronic atrophy and small vessel ischemic changes.   Electronically Signed   By: Burman Nieves M.D.   On: 09/15/2013 22:06    EKG: Independently reviewed.  Chronic A.Fib, rate controlled, and an old anterior wall infarct.  No significant change from last EKG.  Assessment/Plan Principal Problem:   Closed intertrochanteric fracture of right hip Active Problems:   Atrial fibrillation with controlled ventricular response   Anemia   DM type 2, uncontrolled, with renal complications   1. Closed R hip fracture - Dr. Lajoyce Corners contacted by EDP, he is working at [facility] hospital tomorrow and requests that the patient be transferred to [facility] so he can perform the surgery tomorrow.  Patient is initially reluctant (she prefers [facility]); however, she does consent to the transfer.  Since she is allergic to ASA, will just use SCDs prior to surgery for DVT ppx.  Holding diuretics and putting patient on small amount of IVF while NPO for surgery. 2. Anemia - chronic and baseline for this patient 3. DM2 - putting patient on low dose SSI q4h cbg checks while  NPO. 4. A.Fib - continue rate control, patient was taken off of coumadin last year after her June fall it would appear.  Code Status: Full Code  Family Communication: Son contact information in chart Disposition Plan: Admit to inpatient   Time spent: 6770 min  Hillary BowJared M Gardner [facility] Pager [phone]  If 7AM-7PM, please contact the day team taking care of the patient [email or web address] Password [email or web address] 09/15/2013, 11:55 PM

## 2013-09-15 NOTE — ED Notes (Signed)
Patient transported to X-ray 

## 2013-09-15 NOTE — ED Notes (Signed)
Bed: [id number] Expected date:  Expected time:  Means of arrival:  Comments: EMS F R hip pain, shortening and rotation

## 2013-09-15 NOTE — Progress Notes (Signed)
Clinical Social Work Department BRIEF PSYCHOSOCIAL ASSESSMENT 09/15/2013  Patient:  Lori [name], Lori [name]     Account Number:  [id number]     Admit date:  09/15/2013  Clinical Social Worker:  Luretha Rued  Date/Time:  09/15/2013 09:30 PM  Referred by:  CSW  Date Referred:  09/15/2013  Other Referral:   Interview type:  Patient Other interview type:   CSW spoke with family over the phone    PSYCHOSOCIAL DATA Living Status:  FACILITY Admitted from facility:  [facility] Level of care:  Assisted Living Primary support name:  Eriyanna Kofoed Primary support relationship to patient:  CHILD, ADULT Degree of support available:   High level of support as evidenced by telephone call    CURRENT CONCERNS  Other Concerns:    SOCIAL WORK ASSESSMENT / PLAN CSW met with patient at bedside to complete this assessment.  Patient presents as alert, calm, oriented x4, and cooperative.  Patient reports that she tripped over her walker and fell then the walker fell on top of her.  CSW provided the patient SNF resources for her and family to review. Patient gave CSW permission to contact one of her sons. CSW spoke with Lori [name] as he is aware of his mother's ED admission and he was also given an explanation of the social worker's role.  Patient and family are open and willing to comply with discharge planning recommendations.   Assessment/plan status:  Psychosocial Support/Ongoing Assessment of Needs Other assessment/ plan:   Information/referral to community resources:   SNF    PATIENT'S/FAMILY'S RESPONSE TO PLAN OF CARE: Patient and family expressed their appreciation of the support from the social work department.       Chesley Noon, MSW, [place], ,09/15/2013 Evening Clinical Social Worker [phone]

## 2013-09-16 ENCOUNTER — Inpatient Hospital Stay (HOSPITAL_COMMUNITY): Payer: Medicare Other

## 2013-09-16 ENCOUNTER — Inpatient Hospital Stay (HOSPITAL_COMMUNITY): Payer: Medicare Other | Admitting: Anesthesiology

## 2013-09-16 ENCOUNTER — Encounter (HOSPITAL_COMMUNITY)
Admission: EM | Disposition: A | Discharge status: Skilled Nursing Facility | Payer: Self-pay | Source: Home / Self Care | Attending: Internal Medicine

## 2013-09-16 ENCOUNTER — Encounter (HOSPITAL_COMMUNITY): Payer: Medicare Other | Admitting: Anesthesiology

## 2013-09-16 HISTORY — PX: INTRAMEDULLARY (IM) NAIL INTERTROCHANTERIC: SHX5875

## 2013-09-16 LAB — GLUCOSE, CAPILLARY
GLUCOSE-CAPILLARY: 182 mg/dL — AB (ref 70–99)
Glucose-Capillary: 162 mg/dL — ABNORMAL HIGH (ref 70–99)
Glucose-Capillary: 141 mg/dL — ABNORMAL HIGH (ref 70–99)
Glucose-Capillary: 170 mg/dL — ABNORMAL HIGH (ref 70–99)
Glucose-Capillary: 125 mg/dL — ABNORMAL HIGH (ref 70–99)

## 2013-09-16 LAB — CBG MONITORING, ED
GLUCOSE-CAPILLARY: 160 mg/dL — AB (ref 70–99)
Glucose-Capillary: 170 mg/dL — ABNORMAL HIGH (ref 70–99)

## 2013-09-16 LAB — PREPARE RBC (CROSSMATCH)

## 2013-09-16 LAB — SURGICAL PCR SCREEN
MRSA, PCR: POSITIVE — AB
STAPHYLOCOCCUS AUREUS: POSITIVE — AB

## 2013-09-16 SURGERY — FIXATION, FRACTURE, INTERTROCHANTERIC, WITH INTRAMEDULLARY ROD
Anesthesia: General | Site: Hip | Laterality: Right

## 2013-09-16 MED ORDER — CHLORHEXIDINE GLUCONATE 4 % EX LIQD
60.0000 mL | Freq: Once | CUTANEOUS | Status: DC
  Administered 2013-09-16: 4 via TOPICAL
  Filled 2013-09-16: qty 60

## 2013-09-16 MED ORDER — 0.9 % SODIUM CHLORIDE (POUR BTL) OPTIME
TOPICAL | Status: DC | PRN
  Administered 2013-09-16: 500 mL

## 2013-09-16 MED ORDER — WARFARIN VIDEO: Freq: Once | Status: DC

## 2013-09-16 MED ORDER — FENTANYL CITRATE 0.05 MG/ML IJ SOLN
INTRAMUSCULAR | Status: DC | PRN
  Administered 2013-09-16 (×2): 25 ug via INTRAVENOUS
  Administered 2013-09-16: 50 ug via INTRAVENOUS
  Administered 2013-09-16 (×2): 25 ug via INTRAVENOUS
  Administered 2013-09-16 (×2): 50 ug via INTRAVENOUS

## 2013-09-16 MED ORDER — PROPOFOL 10 MG/ML IV BOLUS
INTRAVENOUS | Status: AC
  Filled 2013-09-16: qty 20

## 2013-09-16 MED ORDER — METOCLOPRAMIDE HCL 10 MG PO TABS: 5.0000 mg | ORAL_TABLET | Freq: Three times a day (TID) | ORAL | Status: DC | PRN

## 2013-09-16 MED ORDER — ACETAMINOPHEN 650 MG RE SUPP: 650.0000 mg | Freq: Four times a day (QID) | RECTAL | Status: DC | PRN

## 2013-09-16 MED ORDER — ACETAMINOPHEN 500 MG PO TABS: 500.0000 mg | ORAL_TABLET | Freq: Four times a day (QID) | ORAL | Status: AC | PRN

## 2013-09-16 MED ORDER — ROCURONIUM BROMIDE 50 MG/5ML IV SOLN
INTRAVENOUS | Status: AC
  Filled 2013-09-16: qty 1

## 2013-09-16 MED ORDER — WARFARIN SODIUM 2.5 MG PO TABS
2.5000 mg | ORAL_TABLET | Freq: Once | ORAL | Status: AC
  Administered 2013-09-16: 2.5 mg via ORAL
  Filled 2013-09-16: qty 1

## 2013-09-16 MED ORDER — CLINDAMYCIN PHOSPHATE 900 MG/50ML IV SOLN: 900.0000 mg | INTRAVENOUS | Status: DC

## 2013-09-16 MED ORDER — PROPOFOL 10 MG/ML IV BOLUS
INTRAVENOUS | Status: DC | PRN
  Administered 2013-09-16: 50 mg via INTRAVENOUS

## 2013-09-16 MED ORDER — VANCOMYCIN HCL IN DEXTROSE 1-5 GM/200ML-% IV SOLN
1000.0000 mg | Freq: Two times a day (BID) | INTRAVENOUS | Status: AC
  Administered 2013-09-16: 1000 mg via INTRAVENOUS
  Filled 2013-09-16: qty 200

## 2013-09-16 MED ORDER — LIDOCAINE HCL (CARDIAC) 20 MG/ML IV SOLN
INTRAVENOUS | Status: AC
  Filled 2013-09-16: qty 5

## 2013-09-16 MED ORDER — HYDROCODONE-ACETAMINOPHEN 5-325 MG PO TABS: 1.0000 | ORAL_TABLET | Freq: Four times a day (QID) | ORAL | Status: DC | PRN

## 2013-09-16 MED ORDER — FENTANYL CITRATE 0.05 MG/ML IJ SOLN
INTRAMUSCULAR | Status: AC
  Filled 2013-09-16: qty 5

## 2013-09-16 MED ORDER — LIDOCAINE HCL (CARDIAC) 20 MG/ML IV SOLN
INTRAVENOUS | Status: DC | PRN
  Administered 2013-09-16: 40 mg via INTRAVENOUS

## 2013-09-16 MED ORDER — WARFARIN - PHARMACIST DOSING INPATIENT
Freq: Every day | Status: DC
  Administered 2013-09-18: 18:00:00

## 2013-09-16 MED ORDER — ACETAMINOPHEN 325 MG PO TABS
650.0000 mg | ORAL_TABLET | Freq: Four times a day (QID) | ORAL | Status: DC | PRN
  Administered 2013-09-16 – 2013-09-19 (×5): 650 mg via ORAL
  Filled 2013-09-16 (×7): qty 2

## 2013-09-16 MED ORDER — MENTHOL 3 MG MT LOZG: 1.0000 | LOZENGE | OROMUCOSAL | Status: DC | PRN

## 2013-09-16 MED ORDER — PHENYLEPHRINE HCL 10 MG/ML IJ SOLN
10.0000 mg | INTRAVENOUS | Status: DC | PRN
  Administered 2013-09-16: 40 ug/min via INTRAVENOUS

## 2013-09-16 MED ORDER — MORPHINE SULFATE 2 MG/ML IJ SOLN: 0.5000 mg | INTRAMUSCULAR | Status: DC | PRN

## 2013-09-16 MED ORDER — COUMADIN BOOK
Freq: Once | Status: AC
  Administered 2013-09-16: 17:00:00
  Filled 2013-09-16: qty 1

## 2013-09-16 MED ORDER — VANCOMYCIN HCL IN DEXTROSE 1-5 GM/200ML-% IV SOLN
INTRAVENOUS | Status: AC
  Administered 2013-09-16: 1000 mg via INTRAVENOUS
  Filled 2013-09-16: qty 200

## 2013-09-16 MED ORDER — MUPIROCIN 2 % EX OINT
TOPICAL_OINTMENT | Freq: Two times a day (BID) | CUTANEOUS | Status: DC
  Administered 2013-09-16: 1 via NASAL
  Administered 2013-09-16 – 2013-09-18 (×4): via NASAL
  Administered 2013-09-18: 1 via NASAL
  Administered 2013-09-19: 11:00:00 via NASAL
  Filled 2013-09-16 (×2): qty 22

## 2013-09-16 MED ORDER — METOCLOPRAMIDE HCL 5 MG/ML IJ SOLN: 5.0000 mg | Freq: Three times a day (TID) | INTRAMUSCULAR | Status: DC | PRN

## 2013-09-16 MED ORDER — PHENOL 1.4 % MT LIQD: 1.0000 | OROMUCOSAL | Status: DC | PRN

## 2013-09-16 MED ORDER — ASPIRIN EC 81 MG PO TBEC: 81.0000 mg | DELAYED_RELEASE_TABLET | Freq: Every day | ORAL | Status: DC

## 2013-09-16 MED ORDER — CHLORHEXIDINE GLUCONATE 4 % EX LIQD
Freq: Once | CUTANEOUS | Status: DC
  Filled 2013-09-16: qty 15

## 2013-09-16 MED ORDER — SODIUM CHLORIDE 0.9 % IV SOLN
INTRAVENOUS | Status: DC
  Administered 2013-09-16: 10:00:00 via INTRAVENOUS

## 2013-09-16 MED ORDER — ONDANSETRON HCL 4 MG PO TABS: 4.0000 mg | ORAL_TABLET | Freq: Four times a day (QID) | ORAL | Status: DC | PRN

## 2013-09-16 MED ORDER — ROCURONIUM BROMIDE 100 MG/10ML IV SOLN
INTRAVENOUS | Status: DC | PRN
  Administered 2013-09-16: 25 mg via INTRAVENOUS

## 2013-09-16 MED ORDER — ONDANSETRON HCL 4 MG/2ML IJ SOLN
4.0000 mg | Freq: Four times a day (QID) | INTRAMUSCULAR | Status: DC | PRN
  Administered 2013-09-16: 4 mg via INTRAVENOUS
  Filled 2013-09-16: qty 2

## 2013-09-16 MED ORDER — FENTANYL CITRATE 0.05 MG/ML IJ SOLN: 25.0000 ug | INTRAMUSCULAR | Status: DC | PRN

## 2013-09-16 SURGICAL SUPPLY — 32 items
BIT DRILL CANN LG 4.3MM (BIT) IMPLANT
BLADE SURG 15 STRL LF DISP TIS (BLADE) ×1 IMPLANT
BLADE SURG 15 STRL SS (BLADE) ×3
COVER SURGICAL LIGHT HANDLE (MISCELLANEOUS) ×3 IMPLANT
COVER TABLE BACK 60X90 (DRAPES) ×3 IMPLANT
DRAPE C-ARM 42X72 X-RAY (DRAPES) ×3 IMPLANT
DRAPE STERI IOBAN 125X83 (DRAPES) ×3 IMPLANT
DRILL BIT CANN LG 4.3MM (BIT) ×3
DRSG ADAPTIC 3X8 NADH LF (GAUZE/BANDAGES/DRESSINGS) ×3 IMPLANT
DRSG MEPILEX BORDER 4X4 (GAUZE/BANDAGES/DRESSINGS) ×3 IMPLANT
DRSG MEPILEX BORDER 4X8 (GAUZE/BANDAGES/DRESSINGS) ×3 IMPLANT
ELECT REM PT RETURN 9FT ADLT (ELECTROSURGICAL) ×3
ELECTRODE REM PT RTRN 9FT ADLT (ELECTROSURGICAL) ×1 IMPLANT
EVACUATOR 1/8 PVC DRAIN (DRAIN) IMPLANT
GLOVE BIOGEL PI IND STRL 9 (GLOVE) ×1 IMPLANT
GLOVE BIOGEL PI INDICATOR 9 (GLOVE) ×2
GLOVE SURG ORTHO 9.0 STRL STRW (GLOVE) ×3 IMPLANT
GOWN STRL REUS W/ TWL XL LVL3 (GOWN DISPOSABLE) ×3 IMPLANT
GOWN STRL REUS W/TWL XL LVL3 (GOWN DISPOSABLE) ×9
HIP FRA NAIL LAG SCREW 10.5X90 (Orthopedic Implant) ×3 IMPLANT
KIT BASIN OR (CUSTOM PROCEDURE TRAY) ×3 IMPLANT
KIT ROOM TURNOVER OR (KITS) ×3 IMPLANT
MANIFOLD NEPTUNE II (INSTRUMENTS) ×1 IMPLANT
NAIL HIP FRACT 130D 11X180 (Screw) ×2 IMPLANT
NS IRRIG 1000ML POUR BTL (IV SOLUTION) ×3 IMPLANT
PACK GENERAL/GYN (CUSTOM PROCEDURE TRAY) ×3 IMPLANT
PAD ARMBOARD 7.5X6 YLW CONV (MISCELLANEOUS) ×8 IMPLANT
SCREW BONE CORTICAL 5.0X3 (Screw) ×2 IMPLANT
SCREW LAG HIP FRA NAIL 10.5X90 (Orthopedic Implant) IMPLANT
STAPLER VISISTAT 35W (STAPLE) IMPLANT
SUT VIC AB 2-0 CTB1 (SUTURE) IMPLANT
WATER STERILE IRR 1000ML POUR (IV SOLUTION) ×2 IMPLANT

## 2013-09-16 NOTE — Anesthesia Preprocedure Evaluation (Addendum)
Anesthesia Evaluation  Patient identified by MRN, date of birth, ID band Patient awake    Reviewed: Allergy & Precautions, H&P , NPO status , Patient's Chart, lab work & pertinent test results  Airway Mallampati: II TM Distance: >3 FB Neck ROM: Full    Dental no notable dental hx. (+) Teeth Intact, Dental Advisory Given   Pulmonary COPDformer smoker,  breath sounds clear to auscultation  Pulmonary exam normal       Cardiovascular hypertension, On Medications +CHF + dysrhythmias Atrial Fibrillation Rhythm:Irregular Rate:Normal     Neuro/Psych negative neurological ROS  negative psych ROS   GI/Hepatic Neg liver ROS, GERD-  Medicated and Controlled,  Endo/Other  diabetes, Well Controlled  Renal/GU negative Renal ROS  negative genitourinary   Musculoskeletal   Abdominal   Peds  Hematology  (+) anemia ,   Anesthesia Other Findings   Reproductive/Obstetrics negative OB ROS                         Anesthesia Physical Anesthesia Plan  ASA: III  Anesthesia Plan: General   Post-op Pain Management:    Induction: Intravenous  Airway Management Planned: Oral ETT  Additional Equipment:   Intra-op Plan:   Post-operative Plan: Extubation in OR  Informed Consent: I have reviewed the patients History and Physical, chart, labs and discussed the procedure including the risks, benefits and alternatives for the proposed anesthesia with the patient or authorized representative who has indicated his/her understanding and acceptance.   Dental advisory given  Plan Discussed with: CRNA  Anesthesia Plan Comments:         Anesthesia Quick Evaluation

## 2013-09-16 NOTE — Consult Note (Signed)
Reason for Consult: Right intertrochanteric hip fracture Referring Physician: Dr. Roselind Messier is an 80 y.o. female.  HPI: Patient is a 80 year old woman with diabetes dementia status post mechanical fall with a right intertrochanteric hip fracture. Patient is status post bipolar hemiarthroplasty on the left.  Past Medical History  Diagnosis Date  . Diabetes mellitus   . Hypertension   . Atrial fibrillation   . Hyperlipidemia   . Peripheral neuropathy   . Gastric AVM   . GERD (gastroesophageal reflux disease)   . Diabetic Charcot foot   . Shortness of breath     Past Surgical History  Procedure Laterality Date  . Tonsillectomy    . Lung removal, partial    . Thyroidectomy, partial    . Hip arthroplasty Left 08/18/2012    Procedure: ARTHROPLASTY BIPOLAR HIP;  Surgeon: Marybelle Killings, MD;  Location: [facility];  Service: Orthopedics;  Laterality: Left;    Family History  Problem Relation Age of Onset  . Cancer Mother 70    melanoma  . Aneurysm Father     stomach aneurysm    Social History:  reports that she quit smoking about 11 years ago. Her smoking use included Cigarettes. She has a 5 pack-year smoking history. She does not have any smokeless tobacco history on file. She reports that she does not drink alcohol or use illicit drugs.  Allergies:  Allergies  Allergen Reactions  . Aspirin Other (See Comments)    Unknown   . Indapamide Other (See Comments)    Unknown   . Metformin And Related Other (See Comments)    Unknown   . Penicillins Other (See Comments)    Unknown   . Sulfa Antibiotics Other (See Comments)    Unknown   . Tiotropium Bromide Monohydrate Other (See Comments)    Unknown     Medications: I have reviewed the patient's current medications.  Results for orders placed during the hospital encounter of 09/15/13 (from the past 48 hour(s))  BASIC METABOLIC PANEL     Status: Abnormal   Collection Time    09/15/13 10:30 PM      Result Value  Ref Range   Sodium 138  137 - 147 mEq/L   Potassium 4.7  3.7 - 5.3 mEq/L   Chloride 105  96 - 112 mEq/L   CO2 24  19 - 32 mEq/L   Glucose, Bld 169 (*) 70 - 99 mg/dL   BUN 37 (*) 6 - 23 mg/dL   Creatinine, Ser 1.43 (*) 0.50 - 1.10 mg/dL   Calcium 9.5  8.4 - 10.5 mg/dL   GFR calc non Af Amer 31 (*) >90 mL/min   GFR calc Af Amer 36 (*) >90 mL/min   Comment: (NOTE)     The eGFR has been calculated using the CKD EPI equation.     This calculation has not been validated in all clinical situations.     eGFR's persistently <90 mL/min signify possible Chronic Kidney     Disease.  CBC WITH DIFFERENTIAL     Status: Abnormal   Collection Time    09/15/13 10:30 PM      Result Value Ref Range   WBC 9.2  4.0 - 10.5 K/uL   RBC 3.29 (*) 3.87 - 5.11 MIL/uL   Hemoglobin 9.3 (*) 12.0 - 15.0 g/dL   HCT 29.2 (*) 36.0 - 46.0 %   MCV 88.8  78.0 - 100.0 fL   MCH 28.3  26.0 - 34.0  pg   MCHC 31.8  30.0 - 36.0 g/dL   RDW 13.5  11.5 - 15.5 %   Platelets 262  150 - 400 K/uL   Neutrophils Relative % 70  43 - 77 %   Lymphocytes Relative 21  12 - 46 %   Monocytes Relative 8  3 - 12 %   Eosinophils Relative 1  0 - 5 %   Basophils Relative 0  0 - 1 %   Neutro Abs 6.5  1.7 - 7.7 K/uL   Lymphs Abs 1.9  0.7 - 4.0 K/uL   Monocytes Absolute 0.7  0.1 - 1.0 K/uL   Eosinophils Absolute 0.1  0.0 - 0.7 K/uL   Basophils Absolute 0.0  0.0 - 0.1 K/uL   Smear Review MORPHOLOGY UNREMARKABLE    PROTIME-INR     Status: None   Collection Time    09/15/13 10:30 PM      Result Value Ref Range   Prothrombin Time 13.2  11.6 - 15.2 seconds   INR 1.02  0.00 - 1.49  TYPE AND SCREEN     Status: None   Collection Time    09/15/13 10:30 PM      Result Value Ref Range   ABO/RH(D) AB POS     Antibody Screen POS     Sample Expiration 09/18/2013     Antibody Identification ANTI K     DAT, IgG POS     Antibody ID,T [place] PENDING    CBG MONITORING, ED     Status: Abnormal   Collection Time    09/16/13 12:46 AM      Result  Value Ref Range   Glucose-Capillary 170 (*) 70 - 99 mg/dL   Comment 1 Notify RN    CBG MONITORING, ED     Status: Abnormal   Collection Time    09/16/13  4:30 AM      Result Value Ref Range   Glucose-Capillary 160 (*) 70 - 99 mg/dL  GLUCOSE, CAPILLARY     Status: Abnormal   Collection Time    09/16/13  5:40 AM      Result Value Ref Range   Glucose-Capillary 162 (*) 70 - 99 mg/dL    Dg Chest 1 View  09/15/2013   CLINICAL DATA:  Right hip pain after a fall. Shortness of breath due to lung cancer history and partial lung removal.  EXAM: CHEST - 1 VIEW  COMPARISON:  05/29/2013  FINDINGS: Borderline heart size with normal pulmonary vascularity. Emphysematous changes in the lungs with the perihilar interstitial changes likely due to chronic bronchitis. Previous patchy infiltrates have resolved. There is still some fluid or thickened pleura in the right costophrenic angle. Calcification of aorta. Old fracture deformity of the right shoulder.  IMPRESSION: Stable appearance of chronic changes in the chest. Interval resolution of previous patchy infiltrates. No active pulmonary disease.   Electronically Signed   By: Lucienne Capers M.D.   On: 09/15/2013 22:16   Dg Hip Complete Right  09/15/2013   CLINICAL DATA:  Right hip pain after a fall.  EXAM: RIGHT HIP - COMPLETE 2+ VIEW  COMPARISON:  DG HIP COMPLETE 2+V*R* dated 03/03/2010  FINDINGS: New comminuted inter trochanteric fractures of the right hip with varus angulation of fracture fragments. No dislocation. Pelvis appears intact. Interval placement of a left hip hemiarthroplasty. Vascular calcifications.  IMPRESSION: Comminuted inter trochanteric fractures of the right hip with varus angulation.   Electronically Signed   By: Oren Beckmann.D.  On: 09/15/2013 22:14   Ct Head Wo Contrast  09/15/2013   CLINICAL DATA:  Right hip pain after a fall.  EXAM: CT HEAD WITHOUT CONTRAST  TECHNIQUE: Contiguous axial images were obtained from the base of the  skull through the vertex without intravenous contrast.  COMPARISON:  CT HEAD W/O CM dated 11/14/2012  FINDINGS: Diffuse cerebral atrophy. Ventricular dilatation consistent with central atrophy. Patchy low-attenuation changes in the deep white matter consistent with small vessel ischemia. No significant changes since previous study. No mass effect or midline shift. No abnormal extra-axial fluid collections. Gray-white matter junctions are distinct. Basal cisterns are not effaced. No evidence of acute intracranial hemorrhage. No depressed skull fractures. Visualized paranasal sinuses and mastoid air cells are not opacified.  IMPRESSION: No acute intracranial abnormalities. Chronic atrophy and small vessel ischemic changes.   Electronically Signed   By: Lucienne Capers M.D.   On: 09/15/2013 22:06    Review of Systems  All other systems reviewed and are negative.  Blood pressure 108/48, pulse 67, temperature 97.4 F (36.3 C), temperature source Oral, resp. rate 18, height $RemoveBe'5\' 3"'[id number]$  (1.6 m), weight 49.3 kg (108 lb 11 oz), last menstrual period 07/29/1960, SpO2 97.00%. Physical Exam On examination patient's right lower extremity is swollen shortened and externally rotated. Radiograph shows an intertrochanteric hip fracture. Assessment/Plan: Assessment: Intertrochanteric right hip fracture.  Plan: Will plan for intramedullary nail fixation of the right hip. Patient does have dementia and will obtain consent from her son. Plan for surgery today once consent is obtained.  Newt Minion 09/16/2013, 7:08 AM

## 2013-09-16 NOTE — Progress Notes (Signed)
ANTICOAGULATION CONSULT NOTE - Initial Consult  Pharmacy Consult for Coumadin Indication: VTE prophylaxis  Allergies  Allergen Reactions  . Aspirin Other (See Comments)    Unknown   . Indapamide Other (See Comments)    Unknown   . Metformin And Related Other (See Comments)    Unknown   . Penicillins Other (See Comments)    Unknown   . Sulfa Antibiotics Other (See Comments)    Unknown   . Tiotropium Bromide Monohydrate Other (See Comments)    Unknown     Patient Measurements: Height: 5\' 3"  (160 cm) Weight: 108 lb 11 oz (49.3 kg) IBW/kg (Calculated) : 52.4 Heparin Dosing Weight:    Vital Signs: Temp: 98 F (36.7 C) (04/18 1245) Temp src: Oral (04/18 1245) BP: 111/92 mmHg (04/18 1245) Pulse Rate: 98 (04/18 1245)  Labs:  Recent Labs  09/15/13 2230  HGB 9.3*  HCT 29.2*  PLT 262  LABPROT 13.2  INR 1.02  CREATININE 1.43*    Estimated Creatinine Clearance: 19.9 ml/min (by C-G formula based on Cr of 1.43).   Medical History: Past Medical History  Diagnosis Date  . Diabetes mellitus   . Hypertension   . Atrial fibrillation   . Hyperlipidemia   . Peripheral neuropathy   . Gastric AVM   . GERD (gastroesophageal reflux disease)   . Diabetic Charcot foot   . Shortness of breath     Medications:  Prescriptions prior to admission  Medication Sig Dispense Refill  . albuterol (PROVENTIL HFA;VENTOLIN HFA) 108 (90 BASE) MCG/ACT inhaler Inhale 2 puffs into the lungs every 6 (six) hours as needed for wheezing or shortness of breath.  1 Inhaler  2  . beta carotene w/minerals (OCUVITE) tablet Take 1 tablet by mouth daily.      . budesonide (PULMICORT) 180 MCG/ACT inhaler Inhale 1 puff into the lungs 2 (two) times daily.      . calcium carbonate (OS-CAL) 600 MG TABS Take 600 mg by mouth 2 (two) times daily with a meal.      . Cholecalciferol (VITAMIN D-3) 5000 UNITS TABS Take 1 tablet by mouth every morning.       . diltiazem (CARDIZEM) 90 MG tablet Take 180 mg by  mouth 2 (two) times daily.      . furosemide (LASIX) 20 MG tablet Take 20 mg by mouth daily.      Marland Kitchen. guaiFENesin (MUCINEX) 600 MG 12 hr tablet Take 1 tablet (600 mg total) by mouth 2 (two) times daily.  60 tablet  0  . HYDROcodone-acetaminophen (NORCO) 5-325 MG per tablet Take 1 tablet by mouth every 6 (six) hours as needed for pain.  20 tablet  0  . mirtazapine (REMERON) 15 MG tablet Take 15 mg by mouth at bedtime.      . pantoprazole (PROTONIX) 40 MG tablet Take 40 mg by mouth every morning.      . polyethylene glycol powder (GLYCOLAX/MIRALAX) powder Take 17 g by mouth every morning.      . potassium chloride (K-DUR) 10 MEQ tablet Take 10 mEq by mouth daily.      . sennosides-docusate sodium (SENOKOT-S) 8.6-50 MG tablet Take 1 tablet by mouth every morning.      . traMADol (ULTRAM) 50 MG tablet Take 1 tablet (50 mg total) by mouth every 8 (eight) hours as needed for pain. pain  30 tablet  0  . zolpidem (AMBIEN) 5 MG tablet Take 5 mg by mouth at bedtime as needed for sleep.      .Marland Kitchen  ENSURE (ENSURE) Take 237 mLs by mouth 3 (three) times daily as needed (per patient request).         Assessment: R hip fracture after fall  80 y/o F presented with R hip fx after falling at her facility. Patient has frequent falls with L hip fracture in March of last year and a humerus fracture in June of last year. Patient also has afib but was not on any chronic oral anticoagulation prior to admission. 4/18: THA with nail  Labs: Scr 1.43 (at baseline), Hgb 9.3 (at baseline) , baseline INR 1.02, MRSA PCR +.  Goal of Therapy:  INR 2-3 Monitor platelets by anticoagulation protocol: Yes   Plan:  Coumadin 2.5mg  po x 1 tonight Daily INR Will not order teaching book/video for pt with dementia, but can provide for family.   Judith Campillo S. Merilynn Finlandobertson, PharmD, [facility] LLCBCPS Clinical Staff Pharmacist Pager [phone]  Carollee Massedrystal Stillinger Merilynn Finlandobertson 09/16/2013,1:34 PM

## 2013-09-16 NOTE — Progress Notes (Signed)
INITIAL NUTRITION ASSESSMENT  DOCUMENTATION CODES Per approved criteria  -Not Applicable   INTERVENTION: Diet advancement per MD Add Ensure Complete po TID, each supplement providing 350 kcal and 13 grams of protein, when diet advances  RD to continue to follow nutrition care plan   NUTRITION DIAGNOSIS: Suspected sub optimal protein/energy intake related to poor po intake as evidenced by estimated nutrient(s) intake from all sources less than projected needs.   Goal: Meet >/=90% of estimated nutrition needs   Monitor:  PO intake, supplement acceptance, weight trends, labs   Reason for Assessment: MD consult per hip fracture protocol   80 y.o. female  Admitting Dx: Closed intertrochanteric fracture of right hip  ASSESSMENT: 80 y.o. female who presents to the ED after a mechanical fall that occurred in a facility. PMHx significant for DM, HTN, GERD, SOB, Afib, HLD.  Pt is s/p hip fracture surgery 4/18.   Pt was in much pain during assessment and very confused. Pt was just brought back to room from surgery. Pt explained "I don't even know where I am". Could not obtain accurate nutrition history from pt. Pt had lunch tray in room untouched and explained that she was not hungry. She kept complaining of her hip hurting.   Pt was on Ensure TID PTA. Suspect inadequate oral intake.   Unable to perform complete physical exam due to patient being in so much pain.   Elevated Glucose, BUN, Cr   Height: Ht Readings from Last 1 Encounters:  09/16/13 5\' 3"  (1.6 m)    Weight: Wt Readings from Last 1 Encounters:  09/16/13 108 lb 11 oz (49.3 kg)    Ideal Body Weight: 115 lbs   % Ideal Body Weight: 94%   Wt Readings from Last 10 Encounters:  09/16/13 108 lb 11 oz (49.3 kg)  09/16/13 108 lb 11 oz (49.3 kg)  05/31/13 106 lb 4.2 oz (48.2 kg)  11/14/12 110 lb (49.896 kg)  09/15/12 116 lb 3.2 oz (52.708 kg)  09/13/12 116 lb 3.2 oz (52.708 kg)  09/08/12 115 lb 6.4 oz (52.345 kg)   09/06/12 115 lb 6.4 oz (52.345 kg)  09/01/12 119 lb 3.2 oz (54.069 kg)  08/18/12 117 lb 3.2 oz (53.162 kg)    Usual Body Weight: 105 - 115   % Usual Body Weight: 100%   BMI:  Body mass index is 19.26 kg/(m^2).  Estimated Nutritional Needs: Kcal: 1200- 1400  Protein: 60-70 g  Fluid: >/=1.5 L   Skin: Incision R Hip   Diet Order: Clear Liquid  EDUCATION NEEDS: -No education needs identified at this time   Intake/Output Summary (Last 24 hours) at 09/16/13 1414 Last data filed at 09/16/13 1200  Gross per 24 hour  Intake    550 ml  Output    200 ml  Net    350 ml    Last BM: PTA    Labs:   Recent Labs Lab 09/15/13 2230  NA 138  K 4.7  CL 105  CO2 24  BUN 37*  CREATININE 1.43*  CALCIUM 9.5  GLUCOSE 169*    CBG (last 3)   Recent Labs  09/16/13 0540 09/16/13 0947 09/16/13 1204  GLUCAP 162* 141* 182*    Scheduled Meds: . calcium carbonate  1,250 mg Oral BID WC  . cholecalciferol  5,000 Units Oral q morning - 10a  . coumadin book   Does not apply Once  . diltiazem  180 mg Oral BID  . fluticasone  2 puff Inhalation  BID  . guaiFENesin  600 mg Oral BID  . insulin aspart  0-9 Units Subcutaneous 6 times per day  . mirtazapine  15 mg Oral QHS  . mupirocin ointment   Nasal BID  . ondansetron (ZOFRAN) IV  4 mg Intravenous Once  . pantoprazole  40 mg Oral q morning - 10a  . polyethylene glycol  17 g Oral q morning - 10a  . senna-docusate  1 tablet Oral q morning - 10a  . vancomycin  1,000 mg Intravenous Q12H  . warfarin  2.5 mg Oral ONCE-1800  . warfarin   Does not apply Once  . Warfarin - Pharmacist Dosing Inpatient   Does not apply q1800    Continuous Infusions: . sodium chloride 50 mL/hr at 09/16/13 0100  . sodium chloride 20 mL/hr at 09/16/13 [id number]    Past Medical History  Diagnosis Date  . Diabetes mellitus   . Hypertension   . Atrial fibrillation   . Hyperlipidemia   . Peripheral neuropathy   . Gastric AVM   . GERD (gastroesophageal  reflux disease)   . Diabetic Charcot foot   . Shortness of breath     Past Surgical History  Procedure Laterality Date  . Tonsillectomy    . Lung removal, partial    . Thyroidectomy, partial    . Hip arthroplasty Left 08/18/2012    Procedure: ARTHROPLASTY BIPOLAR HIP;  Surgeon: Eldred MangesMark C Yates, MD;  Location: [facility];  Service: Orthopedics;  Laterality: Left;    Eppie Gibsonebekah L Myalynn Lingle, BS Nutrition Intern Pager: [phone]

## 2013-09-16 NOTE — Transfer of Care (Signed)
Immediate Anesthesia Transfer of Care Note  Patient: Lori [name]  Procedure(s) Performed: Procedure(s): INTRAMEDULLARY (IM) NAIL INTERTROCHANTRIC (Right)  Patient Location: PACU  Anesthesia Type:General  Level of Consciousness: sedated  Airway & Oxygen Therapy: Patient Spontanous Breathing and Patient connected to nasal cannula oxygen  Post-op Assessment: Report given to PACU RN  Post vital signs: Reviewed and stable  Complications: No apparent anesthesia complications

## 2013-09-16 NOTE — Anesthesia Procedure Notes (Signed)
Procedure Name: Intubation Date/Time: 09/16/2013 10:39 AM Performed by: Alanda AmassFRIEDMAN, Amiel Mccaffrey A Pre-anesthesia Checklist: Patient identified, Timeout performed, Emergency Drugs available, Suction available and Patient being monitored Patient Re-evaluated:Patient Re-evaluated prior to inductionOxygen Delivery Method: Circle system utilized Preoxygenation: Pre-oxygenation with 100% oxygen Intubation Type: IV induction Ventilation: Mask ventilation without difficulty Laryngoscope Size: Mac and 3 Grade View: Grade I Tube type: Oral Tube size: 7.0 mm Number of attempts: 1 Airway Equipment and Method: Stylet Placement Confirmation: ETT inserted through vocal cords under direct vision,  breath sounds checked- equal and bilateral and positive ETCO2 Secured at: 19 cm Tube secured with: Tape Dental Injury: Teeth and Oropharynx as per pre-operative assessment

## 2013-09-16 NOTE — ED Notes (Signed)
Nursing staff on receiving has been notified of transfer delay.

## 2013-09-16 NOTE — Progress Notes (Signed)
Patient admitted last PM by Dr. Julian ReilGardner, please see H&P.  Currently in surgery  Lori CanaryJessica Vann DO

## 2013-09-16 NOTE — Anesthesia Postprocedure Evaluation (Signed)
  Anesthesia Post-op Note  Patient: Lori [name]  Procedure(s) Performed: Procedure(s): INTRAMEDULLARY (IM) NAIL INTERTROCHANTRIC (Right)  Patient Location: PACU  Anesthesia Type:General  Level of Consciousness: awake and alert   Airway and Oxygen Therapy: Patient Spontanous Breathing  Post-op Pain: none  Post-op Assessment: Post-op Vital signs reviewed, Patient's Cardiovascular Status Stable and Respiratory Function Stable  Post-op Vital Signs: Reviewed  Filed Vitals:   09/16/13 1224  BP: 146/93  Pulse: 88  Temp:   Resp: 13    Complications: No apparent anesthesia complications

## 2013-09-16 NOTE — ED Provider Notes (Signed)
Medical screening examination/treatment/procedure(s) were conducted as a shared visit with non-physician practitioner(s) and myself.  I personally evaluated the patient during the encounter.   EKG Interpretation   Date/Time:  Friday September 15 2013 21:33:16 EDT Ventricular Rate:  83 PR Interval:    QRS Duration: 74 QT Interval:  382 QTC Calculation: 449 R Axis:   54 Text Interpretation:  Atrial fibrillation Anterior infarct, old No  significant change since last tracing Confirmed by [facility] CenterLUNKETT  MD, Alphonzo LemmingsWHITNEY  [phone] on 09/15/2013 10:22:45 PM      Patient with a mechanical fall with shortening and deformity of the lower extremity. Plain films are consistent with hip fracture and patient started on the hip fracture per protocol to be seen by orthopedics and admitted by the hospitalist.  Gwyneth SproutWhitney Otillia Cordone, MD 09/16/13 2328

## 2013-09-16 NOTE — Op Note (Signed)
OPERATIVE REPORT  DATE OF SURGERY: 09/16/2013  PATIENT:  Lori [name],  80 y.o. female  PRE-OPERATIVE DIAGNOSIS:  fracture  POST-OPERATIVE DIAGNOSIS:  Right hip fracture  PROCEDURE:  Procedure(s): INTRAMEDULLARY (IM) NAIL INTERTROCHANTRIC Biomet nail 11 x 170 mm 130 90 mm barrel  SURGEON:  Surgeon(s): Nadara MustardMarcus V Saraann Enneking, MD  ANESTHESIA:   general  EBL:  min ML  SPECIMEN:  No Specimen  TOURNIQUET:  * No tourniquets in log *  PROCEDURE DETAILS: Patient is a 80 year old woman who mechanical fall sustaining an intertrochanteric hip fracture on the right. Patient was evaluated medically felt to be safe for surgical intervention and presents at this time for intramedullary nailing. Risks and benefits were discussed with the patient's family including infection neurovascular injury failure of fixation inability to walk mortality risk. Patient's family state they understand and wish to proceed at this time. Description of procedure patient was brought to the operating room and underwent a general anesthetic. After adequate levels of anesthesia were obtained patient was placed supine [facility] the [facility] the right lower extremity was placed in boot traction the left lower extremity was placed in the dorsal lithotomy position the right lower extremity was prepped using Dura a timeout was called. An incision was made just proximal to the greater trochanter. A guidewire was inserted down the femoral canal this was overreamed and the nail was inserted. The guidewire was inserted center center in the femoral head. This was over drilled to 90 mm and a 90 mm barrel was inserted. This was a compressed with the compression screw distal locking screw was placed and 34 mm. The wounds were irrigated with normal saline. C-arm fluoroscopy verified alignment. The wounds were closed using 0 Vicryl and staples. Mepilex dressing was applied. Patient was extubated taken to the PACU in stable condition.Prep draped  into a sterile field the fracture was reduced.  PLAN OF CARE: Admit to inpatient   PATIENT DISPOSITION:  PACU - hemodynamically stable.   Nadara MustardMarcus V Takeia Ciaravino, MD 09/16/2013 11:41 AM

## 2013-09-16 NOTE — Progress Notes (Signed)
Agree with intern assessment and intervention. Faysal Fenoglio Barnett RD, LDN Inpatient Clinical Dietitian Pager: [phone] After Hours Pager: [phone]  

## 2013-09-17 ENCOUNTER — Encounter (HOSPITAL_COMMUNITY): Payer: Self-pay | Admitting: Orthopedic Surgery

## 2013-09-17 LAB — CBC
HEMATOCRIT: 21.3 % — AB (ref 36.0–46.0)
HEMOGLOBIN: 6.6 g/dL — AB (ref 12.0–15.0)
MCH: 28.3 pg (ref 26.0–34.0)
MCHC: 31 g/dL (ref 30.0–36.0)
MCV: 91.4 fL (ref 78.0–100.0)
Platelets: 165 10*3/uL (ref 150–400)
RBC: 2.33 MIL/uL — ABNORMAL LOW (ref 3.87–5.11)
RDW: 14.1 % (ref 11.5–15.5)
WBC: 9.1 10*3/uL (ref 4.0–10.5)

## 2013-09-17 LAB — GLUCOSE, CAPILLARY
GLUCOSE-CAPILLARY: 125 mg/dL — AB (ref 70–99)
GLUCOSE-CAPILLARY: 132 mg/dL — AB (ref 70–99)
GLUCOSE-CAPILLARY: 155 mg/dL — AB (ref 70–99)
GLUCOSE-CAPILLARY: 204 mg/dL — AB (ref 70–99)
Glucose-Capillary: 151 mg/dL — ABNORMAL HIGH (ref 70–99)
Glucose-Capillary: 148 mg/dL — ABNORMAL HIGH (ref 70–99)

## 2013-09-17 LAB — BASIC METABOLIC PANEL
BUN: 31 mg/dL — AB (ref 6–23)
CHLORIDE: 108 meq/L (ref 96–112)
CO2: 21 mEq/L (ref 19–32)
Calcium: 8.7 mg/dL (ref 8.4–10.5)
Creatinine, Ser: 1.24 mg/dL — ABNORMAL HIGH (ref 0.50–1.10)
GFR calc non Af Amer: 37 mL/min — ABNORMAL LOW (ref >90)
GFR, EST AFRICAN AMERICAN: 43 mL/min — AB (ref >90)
GLUCOSE: 161 mg/dL — AB (ref 70–99)
Potassium: 4.8 mEq/L (ref 3.7–5.3)
Sodium: 137 mEq/L (ref 137–147)

## 2013-09-17 MED ORDER — WARFARIN SODIUM 2.5 MG PO TABS
2.5000 mg | ORAL_TABLET | Freq: Once | ORAL | Status: AC
  Administered 2013-09-17: 2.5 mg via ORAL
  Filled 2013-09-17: qty 1

## 2013-09-17 MED ORDER — INSULIN ASPART 100 UNIT/ML ~~LOC~~ SOLN
0.0000 [IU] | Freq: Three times a day (TID) | SUBCUTANEOUS | Status: DC
  Administered 2013-09-17: 3 [IU] via SUBCUTANEOUS
  Administered 2013-09-17: 2 [IU] via SUBCUTANEOUS
  Administered 2013-09-18 (×2): 1 [IU] via SUBCUTANEOUS
  Administered 2013-09-18: 2 [IU] via SUBCUTANEOUS

## 2013-09-17 NOTE — Progress Notes (Signed)
Chart reviewed.   [facility] PROGRESS NOTE  Lori [name]:[id number] DOB: 1921-03-26 DOA: 09/15/2013 PCP: Thayer Headings, MD  Assessment/Plan:  Principal Problem:   Closed intertrochanteric fracture of right hip Active Problems:   Atrial fibrillation with controlled ventricular response   Anemia   DM type 2, uncontrolled, with renal complications  Refused labs this morning. Agrees now  HPI/Subjective: No complaints  Objective: Filed Vitals:   09/17/13 1511  BP: 112/40  Pulse: 74  Temp: 100.2 F (37.9 C)  Resp: 16    Intake/Output Summary (Last 24 hours) at 09/17/13 1724 Last data filed at 09/17/13 0900  Gross per 24 hour  Intake   1019 ml  Output    250 ml  Net    769 ml   Filed Weights   09/16/13 0530  Weight: 49.3 kg (108 lb 11 oz)    Exam:   General:  Asleep. arousable  Cardiovascular: irreg irreg  Respiratory: CTA without WRR  Abdomen: S, NT, ND  Ext: no CCE  Basic Metabolic Panel:  Recent Labs Lab 09/15/13 2230  NA 138  K 4.7  CL 105  CO2 24  GLUCOSE 169*  BUN 37*  CREATININE 1.43*  CALCIUM 9.5   Liver Function Tests: No results found for this basename: AST, ALT, ALKPHOS, BILITOT, PROT, ALBUMIN,  in the last 168 hours No results found for this basename: LIPASE, AMYLASE,  in the last 168 hours No results found for this basename: AMMONIA,  in the last 168 hours CBC:  Recent Labs Lab 09/15/13 2230  WBC 9.2  NEUTROABS 6.5  HGB 9.3*  HCT 29.2*  MCV 88.8  PLT 262   Cardiac Enzymes: No results found for this basename: CKTOTAL, CKMB, CKMBINDEX, TROPONINI,  in the last 168 hours BNP (last 3 results)  Recent Labs  05/28/13 0527 05/30/13 0350  PROBNP 4043.0* 3363.0*   CBG:  Recent Labs Lab 09/17/13 0008 09/17/13 0404 09/17/13 0804 09/17/13 1053 09/17/13 1553  GLUCAP 155* 125* 132* 204* 151*    Recent Results (from the past 240 hour(s))  SURGICAL PCR SCREEN     Status: Abnormal   Collection Time   09/16/13  6:00 AM      Result Value Ref Range Status   MRSA, PCR POSITIVE (*) NEGATIVE Final   Comment: RESULT CALLED TO, READ BACK BY AND VERIFIED WITH:     K.CORBETT,RN [id number] 09/16/13 M.CAMPBELL   Staphylococcus aureus POSITIVE (*) NEGATIVE Final   Comment:            The Xpert SA Assay (FDA     approved for NASAL specimens     in patients over 42 years of age),     is one component of     a comprehensive surveillance     program.  Test performance has     been validated by [employer] for patients greater     than or equal to 24 year old.     It is not intended     to diagnose infection nor to     guide or monitor treatment.     Studies: Dg Chest 1 View  09/15/2013   CLINICAL DATA:  Right hip pain after a fall. Shortness of breath due to lung cancer history and partial lung removal.  EXAM: CHEST - 1 VIEW  COMPARISON:  05/29/2013  FINDINGS: Borderline heart size with normal pulmonary vascularity. Emphysematous changes in the lungs with the perihilar interstitial changes likely due to  chronic bronchitis. Previous patchy infiltrates have resolved. There is still some fluid or thickened pleura in the right costophrenic angle. Calcification of aorta. Old fracture deformity of the right shoulder.  IMPRESSION: Stable appearance of chronic changes in the chest. Interval resolution of previous patchy infiltrates. No active pulmonary disease.   Electronically Signed   By: Burman NievesWilliam  Stevens M.D.   On: 09/15/2013 22:16   Dg Hip Complete Right  09/15/2013   CLINICAL DATA:  Right hip pain after a fall.  EXAM: RIGHT HIP - COMPLETE 2+ VIEW  COMPARISON:  DG HIP COMPLETE 2+V*R* dated 03/03/2010  FINDINGS: New comminuted inter trochanteric fractures of the right hip with varus angulation of fracture fragments. No dislocation. Pelvis appears intact. Interval placement of a left hip hemiarthroplasty. Vascular calcifications.  IMPRESSION: Comminuted inter trochanteric fractures of the right hip with varus  angulation.   Electronically Signed   By: Burman NievesWilliam  Stevens M.D.   On: 09/15/2013 22:14   Dg Hip Operative Right  09/16/2013   CLINICAL DATA:  Open reduction internal fixation for fracture  EXAM: DG OPERATIVE RIGHT HIP  TECHNIQUE: A single spot fluoroscopic AP image of the right hip is submitted.  COMPARISON:  September 15, 2013  FINDINGS: There is screw and rod fixation through an intertrochanteric femur fracture. The tip of the screws in the proximal femoral head. There is no new fracture or dislocation. Alignment at the fracture site is essentially anatomic.  IMPRESSION: Alignment fracture site essentially anatomic. Tip of screw in proximal femoral head. No dislocation.   Electronically Signed   By: Bretta BangWilliam  Woodruff M.D.   On: 09/16/2013 16:50   Ct Head Wo Contrast  09/15/2013   CLINICAL DATA:  Right hip pain after a fall.  EXAM: CT HEAD WITHOUT CONTRAST  TECHNIQUE: Contiguous axial images were obtained from the base of the skull through the vertex without intravenous contrast.  COMPARISON:  CT HEAD W/O CM dated 11/14/2012  FINDINGS: Diffuse cerebral atrophy. Ventricular dilatation consistent with central atrophy. Patchy low-attenuation changes in the deep white matter consistent with small vessel ischemia. No significant changes since previous study. No mass effect or midline shift. No abnormal extra-axial fluid collections. Gray-white matter junctions are distinct. Basal cisterns are not effaced. No evidence of acute intracranial hemorrhage. No depressed skull fractures. Visualized paranasal sinuses and mastoid air cells are not opacified.  IMPRESSION: No acute intracranial abnormalities. Chronic atrophy and small vessel ischemic changes.   Electronically Signed   By: Burman NievesWilliam  Stevens M.D.   On: 09/15/2013 22:06    Scheduled Meds: . calcium carbonate  1,250 mg Oral BID WC  . cholecalciferol  5,000 Units Oral q morning - 10a  . diltiazem  180 mg Oral BID  . fluticasone  2 puff Inhalation BID  .  guaiFENesin  600 mg Oral BID  . insulin aspart  0-9 Units Subcutaneous TID WC  . mirtazapine  15 mg Oral QHS  . mupirocin ointment   Nasal BID  . ondansetron (ZOFRAN) IV  4 mg Intravenous Once  . pantoprazole  40 mg Oral q morning - 10a  . polyethylene glycol  17 g Oral q morning - 10a  . senna-docusate  1 tablet Oral q morning - 10a  . warfarin   Does not apply Once  . Warfarin - Pharmacist Dosing Inpatient   Does not apply q1800   Continuous Infusions: . sodium chloride 20 mL/hr at 09/17/13 0600  . sodium chloride 20 mL/hr at 09/16/13 0959    Time spent: 25  minutes  Christiane Haorinna L Salaya Holtrop  [facility] Pager [phone]. If 7PM-7AM, please contact night-coverage at [email or web address], password [email or web address] 09/17/2013, 5:24 PM  LOS: 2 days

## 2013-09-17 NOTE — Progress Notes (Signed)
Patient ID: Lori [name], female   DOB: [birth year], 80 y.o.   MRN: [id number] Postoperative day 1 status post intramedullary nail fixation right hip fracture. Patient has refused laboratory draw this morning. Plan for discharge to skilled nursing.

## 2013-09-17 NOTE — Evaluation (Signed)
Physical Therapy Evaluation Patient Details Name: Lori [name] Lori [name] MRN: [id number] DOB: Apr 17, 1921 Today's Date: 09/17/2013   History of Present Illness    Lori [name] Vanyo is a 80 y.o. female who presents to the ED after a mechanical fall that occurred in a facility. She was walking with a walker, tripped, and fell on the walker, fractured her R hip. Underwent IM nail. She has h/o frequent falls recently including L hip fracture in March of last year and a humerus fracture in June of last year.   Clinical Impression  Pt presents with significant limitations to functional mobility due to fear of falling, pain and weakness.  Able to stand x3 and take 2-3 steps before needing to stop.  Needs frequent re-orientation to situation (ie, why her right leg and hip hurt). Will benefit from acute PT to initiate mobility retraining in preparation for SNF level postacute rehab.      Follow Up Recommendations SNF;Supervision/Assistance - 24 hour    Equipment Recommendations  Rolling walker with 5" wheels (has 4 wheeled walker, needs more stable device acutly)    Recommendations for Other Services       Precautions / Restrictions Precautions Precautions: Posterior Hip;Fall Precaution Booklet Issued: No Precaution Comments: chair pad in chair; pt unable to recall any precautions due to memory deficits Restrictions Weight Bearing Restrictions: Yes RLE Weight Bearing: Weight bearing as tolerated Other Position/Activity Restrictions: pt prefers leg rest on chair to be lower for comfort      Mobility  Bed Mobility Overal bed mobility:  (pt up in chair on arrival, returned to chair following eval)                Transfers Overall transfer level: Needs assistance Equipment used: Rolling walker (2 wheeled) Transfers: Sit to/from Stand Sit to Stand: Max assist;+2 physical assistance;+2 safety/equipment (recommend +2 as noted plus fearful and hesitant)         General transfer comment: needs  encouragement throughout activity due to pain, weakness, fearfulness; used pad and verbal cues to reciprocal scoot to edge of chair; frequent demonstrational cues for hand placement to/from standing; strong posterior lean bias noted and incr fearfulness when standing due to 'not steady' despite strong manual assist and encouragement.  Repeated x3, with ability to take 2-3 steps with instructional cues before pt requested back to chair.  Ambulation/Gait Ambulation/Gait assistance: Max assist Ambulation Distance (Feet): 3 Feet (2-3 steps total) Assistive device: Rolling walker (2 wheeled) Gait Pattern/deviations: Step-to pattern;Decreased stride length;Trunk flexed;Narrow base of support   Gait velocity interpretation: Below normal speed for age/gender General Gait Details: as noted, pt needs step-by-step instructional cues to sequence RW and stepping with minimal ability to transfer wt to UE during RIGHT stance.  Limited by fear and pain.  Stairs            Wheelchair Mobility    Modified Rankin (Stroke Patients Only)       Balance Overall balance assessment: History of Falls;Needs assistance Sitting-balance support: Bilateral upper extremity supported;Feet supported Sitting balance-Leahy Scale: Poor Sitting balance - Comments: trunk kyphosis; depends on hands at armrest to maintain unsupported trunk along with moderate assist at trunk to prevent lean posterior Postural control: Posterior lean Standing balance support: Bilateral upper extremity supported;During functional activity Standing balance-Leahy Scale: Zero Standing balance comment: dependent on external support with strong posterior lean bias  Pertinent Vitals/Pain Unable to rate pain; unable to recall why hip/leg hurt.    Home Living Family/patient expects to be discharged to:: Skilled nursing facility                      Prior Function Level of Independence:  Independent with assistive device(s)         Comments: pt reports using walker     Hand Dominance        Extremity/Trunk Assessment   Upper Extremity Assessment: Generalized weakness           Lower Extremity Assessment: Generalized weakness;RLE deficits/detail;Difficult to assess due to impaired cognition RLE Deficits / Details: generally weak and painful due to hip fracture, noting ankle ROM symmetrical and right knee lacks strength to achieve full extension or hold for >2 sec vs. gravity.      Cervical / Trunk Assessment: Kyphotic  Communication   Communication: HOH  Cognition Arousal/Alertness: Awake/alert (sleeping but arouses easily) Behavior During Therapy: WFL for tasks assessed/performed;Anxious Overall Cognitive Status: Within Functional Limits for tasks assessed       Memory: Decreased recall of precautions;Decreased short-term memory (cannot recall what happened to right leg)              General Comments General comments (skin integrity, edema, etc.): returned ice to right hip following session, foot rest raised 75%; no edema in limb noted, bandage at hip noted    Exercises General Exercises - Lower Extremity Ankle Circles/Pumps: AROM;Both;10 reps;Seated Quad Sets: AROM;Both;10 reps;Seated Short Arc Quad: AROM;Both;10 reps;Seated      Assessment/Plan    PT Assessment Patient needs continued PT services  PT Diagnosis Acute pain;Difficulty walking   PT Problem List Pain;Decreased knowledge of precautions;Decreased safety awareness;Decreased knowledge of use of DME;Decreased cognition;Decreased mobility;Decreased balance;Decreased activity tolerance;Decreased range of motion;Decreased strength  PT Treatment Interventions Patient/family education;DME instruction;Gait training;Functional mobility training;Therapeutic activities;Therapeutic exercise;Modalities   PT Goals (Current goals can be found in the Care Plan section) Acute Rehab PT  Goals Patient Stated Goal: not fall PT Goal Formulation: With patient Time For Goal Achievement: 10/01/13 Potential to Achieve Goals: Fair    Frequency Min 3X/week   Barriers to discharge Other (comment) needs higher acuity of care setting beyond ALF    Co-evaluation               End of Session Equipment Utilized During Treatment: Gait belt Activity Tolerance: Patient limited by pain Patient left: in chair;with call bell/phone within reach;with chair alarm set Nurse Communication: Mobility status;Precautions         Time: [phone] [id number] PT Time Calculation (min): 33 min   Charges:   PT Evaluation $Initial PT Evaluation Tier I: 1 Procedure PT Treatments $Therapeutic Exercise: 8-22 mins $Therapeutic Activity: 8-22 mins   PT G Codes:          Dennis BastJennifer Galloway Gerrica Cygan 09/17/2013, 10:46 AM

## 2013-09-17 NOTE — Progress Notes (Signed)
ANTICOAGULATION CONSULT NOTE - Follow Up Consult  Pharmacy Consult for Coumadin Indication: VTE prophylaxis  Allergies  Allergen Reactions  . Aspirin Other (See Comments)    Unknown   . Indapamide Other (See Comments)    Unknown   . Metformin And Related Other (See Comments)    Unknown   . Penicillins Other (See Comments)    Unknown   . Sulfa Antibiotics Other (See Comments)    Unknown   . Tiotropium Bromide Monohydrate Other (See Comments)    Unknown     Patient Measurements: Height: 5\' 3"  (160 cm) Weight: 108 lb 11 oz (49.3 kg) IBW/kg (Calculated) : 52.4 Heparin Dosing Weight:    Vital Signs: Temp: 98.2 F (36.8 C) (04/19 0350) Temp src: Axillary (04/19 0350) BP: 104/42 mmHg (04/19 0350) Pulse Rate: 94 (04/19 0630)  Labs:  Recent Labs  09/15/13 2230  HGB 9.3*  HCT 29.2*  PLT 262  LABPROT 13.2  INR 1.02  CREATININE 1.43*    Estimated Creatinine Clearance: 19.9 ml/min (by C-G formula based on Cr of 1.43).   Assessment: R hip fracture after fall  80 y/o F presented with R hip fx after falling at her facility. Patient has frequent falls with L hip fracture in March of last year and a humerus fracture in June of last year. Patient also has afib but was not on any chronic oral anticoagulation prior to admission. 4/18: THA with nail  Anticoagulation: Coumadin for VTE propx s/p hip rx repair. Pt refused lab draws this AM. Dr. Lajoyce Cornersuda plans SNF. Will repeat dose of coumadin tonight but will require labs if continued.   Goal of Therapy:  INR 2-3 Monitor platelets by anticoagulation protocol: Yes   Plan:  Repeat Coumadin 2.5mg  po x 1 tonight.   Tandy Grawe S. Merilynn Finlandobertson, PharmD, [facility] Clinical Staff Pharmacist Pager [phone]  [facility] HospitalCrystal Stillinger Merilynn Finlandobertson 09/17/2013,11:51 AM

## 2013-09-17 NOTE — Plan of Care (Cosign Needed)
Post op hgb has drifted down from 9.3 to 6.6 c/w expected ABL anemia due to recent surgical procedure. Pt has dementia and unable to sign blood consent. She is o/w hemodynamically stable. I talked with her RN who plans to contact son fo blood consent- if needed I can speak with son to clarify need for blood but otherwise plan is to proceed with 2 units ove 3-4 hours each.  Junious SilkAllison Margaret Staggs, ANP

## 2013-09-18 ENCOUNTER — Encounter (HOSPITAL_COMMUNITY): Payer: Self-pay | Admitting: Orthopedic Surgery

## 2013-09-18 LAB — BASIC METABOLIC PANEL
BUN: 32 mg/dL — ABNORMAL HIGH (ref 6–23)
CO2: 21 mEq/L (ref 19–32)
Calcium: 9 mg/dL (ref 8.4–10.5)
Chloride: 109 mEq/L (ref 96–112)
Creatinine, Ser: 1.29 mg/dL — ABNORMAL HIGH (ref 0.50–1.10)
GFR calc Af Amer: 41 mL/min — ABNORMAL LOW (ref >90)
GFR calc non Af Amer: 35 mL/min — ABNORMAL LOW (ref >90)
GLUCOSE: 140 mg/dL — AB (ref 70–99)
POTASSIUM: 5 meq/L (ref 3.7–5.3)
Sodium: 139 mEq/L (ref 137–147)

## 2013-09-18 LAB — CBC
HCT: 29.3 % — ABNORMAL LOW (ref 36.0–46.0)
HEMOGLOBIN: 9.4 g/dL — AB (ref 12.0–15.0)
MCH: 28.3 pg (ref 26.0–34.0)
MCHC: 32.1 g/dL (ref 30.0–36.0)
MCV: 88.3 fL (ref 78.0–100.0)
Platelets: 165 10*3/uL (ref 150–400)
RBC: 3.32 MIL/uL — ABNORMAL LOW (ref 3.87–5.11)
RDW: 16.3 % — ABNORMAL HIGH (ref 11.5–15.5)
WBC: 8.2 10*3/uL (ref 4.0–10.5)

## 2013-09-18 LAB — GLUCOSE, CAPILLARY
GLUCOSE-CAPILLARY: 106 mg/dL — AB (ref 70–99)
Glucose-Capillary: 129 mg/dL — ABNORMAL HIGH (ref 70–99)
Glucose-Capillary: 129 mg/dL — ABNORMAL HIGH (ref 70–99)
Glucose-Capillary: 165 mg/dL — ABNORMAL HIGH (ref 70–99)

## 2013-09-18 LAB — TYPE AND SCREEN
ABO/RH(D): AB POS
Antibody Screen: POSITIVE
DAT, IgG: POSITIVE

## 2013-09-18 LAB — PROTIME-INR
INR: 1.31 (ref 0.00–1.49)
PROTHROMBIN TIME: 16 s — AB (ref 11.6–15.2)

## 2013-09-18 MED ORDER — WARFARIN SODIUM 2.5 MG PO TABS
2.5000 mg | ORAL_TABLET | Freq: Once | ORAL | Status: AC
  Administered 2013-09-18: 2.5 mg via ORAL
  Filled 2013-09-18: qty 1

## 2013-09-18 NOTE — Progress Notes (Signed)
ANTICOAGULATION CONSULT NOTE - Follow Up Consult  Pharmacy Consult for Coumadin Indication: VTE prophylaxis  Allergies  Allergen Reactions  . Aspirin Other (See Comments)    Unknown   . Indapamide Other (See Comments)    Unknown   . Metformin And Related Other (See Comments)    Unknown   . Penicillins Other (See Comments)    Unknown   . Sulfa Antibiotics Other (See Comments)    Unknown   . Tiotropium Bromide Monohydrate Other (See Comments)    Unknown     Patient Measurements: Height: 5\' 3"  (160 cm) Weight: 108 lb 11 oz (49.3 kg) IBW/kg (Calculated) : 52.4 Heparin Dosing Weight:    Vital Signs: Temp: 98.8 F (37.1 C) (04/20 0515) Temp src: Oral (04/20 0244) BP: 133/53 mmHg (04/20 0515) Pulse Rate: 73 (04/20 0515)  Labs:  Recent Labs  09/15/13 2230 09/17/13 1930 09/18/13 0800  HGB 9.3* 6.6* 9.4*  HCT 29.2* 21.3* 29.3*  PLT 262 165 165  LABPROT 13.2  --  16.0*  INR 1.02  --  1.31  CREATININE 1.43* 1.24* 1.29*    Estimated Creatinine Clearance: 22.1 ml/min (by C-G formula based on Cr of 1.29).   Assessment: R hip fracture after fall  80 y/o F presented with R hip fx after falling at her facility. Patient has frequent falls with L hip fracture in March of last year and a humerus fracture in June of last year. Patient also has afib but was not on any chronic oral anticoagulation prior to admission. S/p THA with nail on 4/18 and on Coumadin for VTE propx. INR 1.31. Plan for discharge to skilled nursing. Hgb 9.4, plt 165, s/p transfusion.   Goal of Therapy:  INR 2-3 Monitor platelets by anticoagulation protocol: Yes   Plan:  Repeat Coumadin 2.5mg  po x 1 tonight. F/u daily INR, f/u discharge plans  Bayard HuggerMei Rudolf Blizard, PharmD, BCPS  Clinical Pharmacist  Pager: [phone]  09/18/2013,11:14 AM

## 2013-09-18 NOTE — Evaluation (Signed)
Occupational Therapy Evaluation Patient Details Name: Lori [name] MRN: [id number] DOB: 11-Jun-1920 Today's Date: 09/18/2013    History of Present Illness pt presents with R hip fx s/p IM nail, baseline dementia   Clinical Impression   Pt presents with pain and anxiety.  She participates maximally in therapy, but limited by fear of falling and hip pain. She required max assist for [facility]  She needs assist for bathing, dressing and toileting, but she can groom seated with set up.  Will need ST rehab in SNF prior to return to ALF.  Will defer OT to SNF.    Follow Up Recommendations  SNF;Supervision/Assistance - 24 hour    Equipment Recommendations       Recommendations for Other Services       Precautions / Restrictions Precautions Precautions: Posterior Hip;Fall Precaution Comments: pt IM nail, so no hip precautions.   Restrictions Weight Bearing Restrictions: Yes RLE Weight Bearing: Weight bearing as tolerated      Mobility Bed Mobility Overal bed mobility:  (pt up in chair)             Transfers Overall transfer level: Needs assistance Equipment used: Rolling walker (2 wheeled) Transfers: Stand Pivot Transfers Stand pivot transfers: Max assist           Balance Overall balance assessment: Needs assistance Sitting-balance support: Feet supported Sitting balance-Leahy Scale: Poor Sitting balance - Comments: trunk kyphosis; depends on hands at armrest to maintain unsupported trunk along with moderate assist at trunk to prevent lean posterior Postural control: Posterior lean Standing balance support: Bilateral upper extremity supported Standing balance-Leahy Scale: Zero                              ADL Overall ADL's : Needs assistance/impaired Eating/Feeding: Independent;Sitting   Grooming: Wash/dry hands;Wash/dry face;Oral care;Brushing hair;Set up;Sitting   Upper Body Bathing: Minimal assitance;Sitting    Lower Body Bathing: +2 for physical assistance;Total assistance;Sit to/from stand   Upper Body Dressing : Sitting;Minimal assistance   Lower Body Dressing: +2 for physical assistance;Total assistance;Sit to/from stand   Toilet Transfer: Maximal assistance;Stand-pivot;BSC   Toileting- Clothing Manipulation and Hygiene: Total assistance;Sitting/lateral lean       Functional mobility during ADLs:  (not able to ambulate)       Vision                     Perception     Praxis      Pertinent Vitals/Pain R hip with movement, resolves once sitting, VSS     Hand Dominance Right   Extremity/Trunk Assessment Upper Extremity Assessment Upper Extremity Assessment: Overall WFL for tasks assessed   Lower Extremity Assessment Lower Extremity Assessment: Defer to PT evaluation     Cervical / Trunk Assessment Cervical / Trunk Assessment: Kyphotic   Communication Communication Communication: HOH   Cognition Arousal/Alertness: Awake/alert Behavior During Therapy: WFL for tasks assessed/performed Overall Cognitive Status: History of cognitive impairments - at baseline       Memory: Decreased recall of precautions;Decreased short-term memory             General Comments       Exercises       Shoulder Instructions      Home Living Family/patient expects to be discharged to:: Skilled nursing facility  Prior Functioning/Environment Level of Independence: Independent with assistive device(s) (sometimes had help for bathing and dressing)        Comments: pt reports using walker, took self to bathroom and dining room    OT Diagnosis: Generalized weakness;Cognitive deficits;Acute pain   OT Problem List: Decreased strength;Decreased activity tolerance;Impaired balance (sitting and/or standing);Decreased cognition;Decreased safety awareness;Decreased knowledge of use of DME or AE;Pain   OT  Treatment/Interventions:      OT Goals(Current goals can be found in the care plan section) Acute Rehab OT Goals Patient Stated Goal: not fall  OT Frequency:     Barriers to D/C:            Co-evaluation              End of Session Nurse Communication: Other (comment) (NT, pt urinated)  Activity Tolerance: Patient tolerated treatment well Patient left: in chair;with call bell/phone within reach;with chair alarm set, multiple instruction in use of call bell   Time: 1055-1135 OT Time Calculation (min): 40 min Charges:  OT General Charges $OT Visit: 1 Procedure OT Evaluation $Initial OT Evaluation Tier I: 1 Procedure OT Treatments $Self Care/Home Management : 8-22 mins G-Codes:    Dayton BailiffJulie Lynn Anthony Tamburo 09/18/2013, 11:46 AM [phone]

## 2013-09-18 NOTE — Progress Notes (Signed)
Patient ID: Lori [name], female   DOB: 03-Sep-1920, 80 y.o.   MRN: [id number] Postoperative day 2 status post intramedullary nailing of a right hip fracture. Patient is resting comfortably this morning. Status post transfusion 2 units of packed red blood cells. Plan for discharge to skilled nursing.

## 2013-09-18 NOTE — Clinical Social Work Psychosocial (Deleted)
Clinical Social Work Department  BRIEF PSYCHOSOCIAL ASSESSMENT  09/15/2013  Patient: Lori [name],Lori [name] Account Number: [id number] Admit date: 09/15/2013  Clinical Social Worker: Corbett,Tressa, LCSWA Date/Time: 09/15/2013 09:30 PM  Referred by: CSW Date Referred: 09/15/2013  Other Referral:  Interview type: Patient  Other interview type:  CSW spoke with family over the phone   PSYCHOSOCIAL DATA  Living Status: FACILITY  Admitted from facility: [facility]  Level of care: Assisted Living  Primary support name: Johnny Colee  Primary support relationship to patient: CHILD, ADULT  Degree of support available:  High level of support as evidenced by telephone call   CURRENT CONCERNS  Other Concerns:  SOCIAL WORK ASSESSMENT / PLAN  CSW met with patient at bedside to complete this assessment. Patient presents as alert, calm, oriented x4, and cooperative. Patient reports that she tripped over her walker and fell then the walker fell on top of her. CSW provided the patient SNF resources for her and family to review. Patient gave CSW permission to contact one of her sons. CSW spoke with Johnny as he is aware of his mother's ED admission and he was also given an explanation of the social worker's role. Patient and family are open and willing to comply with discharge planning recommendations.   Assessment/plan status: Psychosocial Support/Ongoing Assessment of Needs  Other assessment/ plan:  Information/referral to community resources:  SNF   PATIENT'S/FAMILY'S RESPONSE TO PLAN OF CARE:  Patient and family expressed their appreciation of the support from the social work department.   Tressa Corbett, MSW, LCSWA, ,09/15/2013  Evening Clinical Social Worker [place] [phone]  

## 2013-09-18 NOTE — Progress Notes (Signed)
Physical Therapy Treatment Patient Details Name: Lori [name] MRN: [id number] DOB: 1921-01-27 Today's Date: 09/18/2013    History of Present Illness pt presents with R hip fx s/p IM nail.      PT Comments    Pt attempts to A with all mobility, only limited by pain and fatigue.  Will continue to follow.    Follow Up Recommendations  SNF     Equipment Recommendations  Rolling walker with 5" wheels    Recommendations for Other Services       Precautions / Restrictions Precautions Precaution Comments: pt IM nail, so no hip precautions.   Restrictions Weight Bearing Restrictions: Yes RLE Weight Bearing: Weight bearing as tolerated    Mobility  Bed Mobility Overal bed mobility: Needs Assistance Bed Mobility: Supine to Sit     Supine to sit: Mod assist;HOB elevated     General bed mobility comments: pt moves slowly, but does well to attempt to A with mobility.    Transfers Overall transfer level: Needs assistance Equipment used: Rolling walker (2 wheeled) Transfers: Sit to/from [employer]GI CorporationStand;Stand Pivot Transfers Sit to Stand: Mod assist;+2 physical assistance Stand pivot transfers: Mod assist;+2 physical assistance       General transfer comment: cues for RW use and to stay closer to RW.  pt only able to take small hop steps around to chair.    Ambulation/Gait                 Stairs            Wheelchair Mobility    Modified Rankin (Stroke Patients Only)       Balance Overall balance assessment: Needs assistance Sitting-balance support: Bilateral upper extremity supported;Feet supported Sitting balance-Leahy Scale: Poor     Standing balance support: Bilateral upper extremity supported Standing balance-Leahy Scale: Poor                      Cognition Arousal/Alertness: Awake/alert Behavior During Therapy: WFL for tasks assessed/performed Overall Cognitive Status: No family/caregiver present to determine baseline cognitive functioning                      Exercises      General Comments        Pertinent Vitals/Pain R hip hurts during mobility, but denies pain once sitting.  RN aware.      Home Living                      Prior Function            PT Goals (current goals can now be found in the care plan section) Acute Rehab PT Goals Patient Stated Goal: not fall Time For Goal Achievement: 10/01/13 Potential to Achieve Goals: Fair Progress towards PT goals: Progressing toward goals    Frequency  Min 3X/week    PT Plan Current plan remains appropriate    Co-evaluation             End of Session Equipment Utilized During Treatment: Gait belt Activity Tolerance: Patient limited by pain Patient left: in chair;with call bell/phone within reach;with chair alarm set     Time: [id number] PT Time Calculation (min): 18 min  Charges:  $Therapeutic Activity: 8-22 mins                    G CodesSunny Schlein:      [place] Prevo F Kerie Badger, [place] [phone] 09/18/2013, 10:09 AM

## 2013-09-18 NOTE — Progress Notes (Signed)
Utilization review completed.  

## 2013-09-18 NOTE — Progress Notes (Signed)
[facility] PROGRESS NOTE  Lori [name] RUE:[id number] DOB: 05-14-1921 DOA: 09/15/2013 PCP: Thayer HeadingsMACKENZIE,BRIAN, MD  Assessment/Plan:  Principal Problem:   Closed intertrochanteric fracture of right hip, s/p IM nail Active Problems:   Atrial fibrillation with controlled ventricular response   Anemia, acute on chronic due to ABLA: better after 2 units PRBC   DM type 2, uncontrolled, with renal complications   HPI/Subjective: "i want to get back into bed. I'm tied to this chair"  Objective: Filed Vitals:   09/18/13 0515  BP: 133/53  Pulse: 73  Temp: 98.8 F (37.1 C)  Resp: 18    Intake/Output Summary (Last 24 hours) at 09/18/13 1352 Last data filed at 09/18/13 0515  Gross per 24 hour  Intake   1960 ml  Output    150 ml  Net   1810 ml   Filed Weights   09/16/13 0530  Weight: 49.3 kg (108 lb 11 oz)    Exam:   General:  In chair. Agitated and confused. cooperative  Cardiovascular: irreg irreg  Respiratory: CTA without WRR  Abdomen: S, NT, ND  Ext: no CCE  Basic Metabolic Panel:  Recent Labs Lab 09/15/13 2230 09/17/13 1930 09/18/13 0800  NA 138 137 139  K 4.7 4.8 5.0  CL 105 108 109  CO2 24 21 21   GLUCOSE 169* 161* 140*  BUN 37* 31* 32*  CREATININE 1.43* 1.24* 1.29*  CALCIUM 9.5 8.7 9.0   Liver Function Tests: No results found for this basename: AST, ALT, ALKPHOS, BILITOT, PROT, ALBUMIN,  in the last 168 hours No results found for this basename: LIPASE, AMYLASE,  in the last 168 hours No results found for this basename: AMMONIA,  in the last 168 hours CBC:  Recent Labs Lab 09/15/13 2230 09/17/13 1930 09/18/13 0800  WBC 9.2 9.1 8.2  NEUTROABS 6.5  --   --   HGB 9.3* 6.6* 9.4*  HCT 29.2* 21.3* 29.3*  MCV 88.8 91.4 88.3  PLT 262 165 165   Cardiac Enzymes: No results found for this basename: CKTOTAL, CKMB, CKMBINDEX, TROPONINI,  in the last 168 hours BNP (last 3 results)  Recent Labs  05/28/13 0527 05/30/13 0350  PROBNP 4043.0*  3363.0*   CBG:  Recent Labs Lab 09/17/13 1053 09/17/13 1553 09/17/13 2030 09/18/13 0717 09/18/13 1202  GLUCAP 204* 151* 148* 129* 129*    Recent Results (from the past 240 hour(s))  SURGICAL PCR SCREEN     Status: Abnormal   Collection Time    09/16/13  6:00 AM      Result Value Ref Range Status   MRSA, PCR POSITIVE (*) NEGATIVE Final   Comment: RESULT CALLED TO, READ BACK BY AND VERIFIED WITH:     K.CORBETT,RN [id number] 09/16/13 M.CAMPBELL   Staphylococcus aureus POSITIVE (*) NEGATIVE Final   Comment:            The Xpert SA Assay (FDA     approved for NASAL specimens     in patients over 78 years of age),     is one component of     a comprehensive surveillance     program.  Test performance has     been validated by [facility] for patients greater     than or equal to 78 year old.     It is not intended     to diagnose infection nor to     guide or monitor treatment.     Studies: No results  found.  Scheduled Meds: . calcium carbonate  1,250 mg Oral BID WC  . cholecalciferol  5,000 Units Oral q morning - 10a  . diltiazem  180 mg Oral BID  . fluticasone  2 puff Inhalation BID  . guaiFENesin  600 mg Oral BID  . insulin aspart  0-9 Units Subcutaneous TID WC  . mirtazapine  15 mg Oral QHS  . mupirocin ointment   Nasal BID  . ondansetron (ZOFRAN) IV  4 mg Intravenous Once  . pantoprazole  40 mg Oral q morning - 10a  . polyethylene glycol  17 g Oral q morning - 10a  . senna-docusate  1 tablet Oral q morning - 10a  . warfarin  2.5 mg Oral ONCE-1800  . warfarin   Does not apply Once  . Warfarin - Pharmacist Dosing Inpatient   Does not apply q1800   Continuous Infusions: . sodium chloride 20 mL/hr at 09/17/13 0600  . sodium chloride 20 mL/hr at 09/16/13 [id number]    Time spent: 25 minutes  Christiane Haorinna L Tayte Mcwherter  [facility] Pager [phone]. If 7PM-7AM, please contact night-coverage at [email or web address], password [email or web address] 09/18/2013, 1:52 PM  LOS: 3 days

## 2013-09-19 DIAGNOSIS — I5032 Chronic diastolic (congestive) heart failure: Secondary | ICD-10-CM | POA: Diagnosis present

## 2013-09-19 DIAGNOSIS — N183 Chronic kidney disease, stage 3 unspecified: Secondary | ICD-10-CM

## 2013-09-19 LAB — TYPE AND SCREEN
ABO/RH(D): AB POS
ANTIBODY SCREEN: POSITIVE
DAT, IGG: POSITIVE
Donor AG Type: NEGATIVE
Donor AG Type: NEGATIVE
UNIT DIVISION: 0
Unit division: 0

## 2013-09-19 LAB — GLUCOSE, CAPILLARY
GLUCOSE-CAPILLARY: 184 mg/dL — AB (ref 70–99)
Glucose-Capillary: 140 mg/dL — ABNORMAL HIGH (ref 70–99)
Glucose-Capillary: 194 mg/dL — ABNORMAL HIGH (ref 70–99)

## 2013-09-19 LAB — CBC
HCT: 28.4 % — ABNORMAL LOW (ref 36.0–46.0)
Hemoglobin: 9.1 g/dL — ABNORMAL LOW (ref 12.0–15.0)
MCH: 27.9 pg (ref 26.0–34.0)
MCHC: 32 g/dL (ref 30.0–36.0)
MCV: 87.1 fL (ref 78.0–100.0)
PLATELETS: 178 10*3/uL (ref 150–400)
RBC: 3.26 MIL/uL — ABNORMAL LOW (ref 3.87–5.11)
RDW: 15.5 % (ref 11.5–15.5)
WBC: 9.9 10*3/uL (ref 4.0–10.5)

## 2013-09-19 LAB — PROTIME-INR
INR: 1.98 — ABNORMAL HIGH (ref 0.00–1.49)
Prothrombin Time: 21.9 seconds — ABNORMAL HIGH (ref 11.6–15.2)

## 2013-09-19 MED ORDER — INSULIN ASPART 100 UNIT/ML ~~LOC~~ SOLN: 0.0000 [IU] | Freq: Three times a day (TID) | SUBCUTANEOUS | Status: DC

## 2013-09-19 MED ORDER — ONDANSETRON HCL 4 MG PO TABS: 4.0000 mg | ORAL_TABLET | Freq: Four times a day (QID) | ORAL | Status: AC | PRN

## 2013-09-19 MED ORDER — WARFARIN SODIUM 2.5 MG PO TABS
2.5000 mg | ORAL_TABLET | Freq: Once | ORAL | Status: DC
  Filled 2013-09-19: qty 1

## 2013-09-19 MED ORDER — WARFARIN SODIUM 2.5 MG PO TABS: 2.5000 mg | ORAL_TABLET | Freq: Every day | ORAL | Status: DC

## 2013-09-19 MED ORDER — HYDROCODONE-ACETAMINOPHEN 5-325 MG PO TABS: 1.0000 | ORAL_TABLET | Freq: Four times a day (QID) | ORAL | Status: DC | PRN

## 2013-09-19 MED ORDER — INSULIN ASPART 100 UNIT/ML ~~LOC~~ SOLN
0.0000 [IU] | Freq: Three times a day (TID) | SUBCUTANEOUS | Status: DC
  Administered 2013-09-19: 1 [IU] via SUBCUTANEOUS
  Administered 2013-09-19 (×2): 2 [IU] via SUBCUTANEOUS

## 2013-09-19 NOTE — Clinical Social Work Placement (Signed)
Clinical Social Work Department  CLINICAL SOCIAL WORK PLACEMENT NOTE  Patient:Lori [name] Account Number: [id number] Admit date: 09/15/13  3:30 PM  Clinical Social Worker: Sabino NiemannAmy Terrance Lanahan LCSWA Date/time: 09/18/2013 10:30 AM  Clinical Social Work is seeking post-discharge placement for this patient at the following level of care: SKILLED NURSING (*CSW will update this form in Epic as items are completed)  09/18/2013 Patient/family provided with [facility] of Clinical Social Work's list of facilities offering this level of care within the geographic area requested by the patient (or if unable, by the patient's family).  09/18/2013 Patient/family informed of their freedom to choose among providers that offer the needed level of care, that participate in Medicare, Medicaid or managed care program needed by the patient, have an available bed and are willing to accept the patient.  09/18/2013 Patient/family informed of [facility]' ownership interest in [facility], as well as of the fact that they are under no obligation to receive care at this facility.  PASARR submitted to EDS on Pre-existing PASARR number received from EDS on  FL2 transmitted to all facilities in geographic area requested by pt/family on 09/18/2013  FL2 transmitted to all facilities within larger geographic area on  Patient informed that his/her managed care company has contracts with or will negotiate with certain facilities, including the following:  Patient/family informed of bed offers received: 09/19/2013 [facility] recommends and patient chooses bed at  Patient to be transferred to on 09/19/2013  [facility]  The following physician request were entered in Epic:  Additional Comments:

## 2013-09-19 NOTE — Care Management Note (Signed)
CARE MANAGEMENT NOTE 09/19/2013  Patient:  Lori [name],Lori [name]   Account Number:  [id number]  Date Initiated:  09/19/2013  Documentation initiated by:  Vance PeperBRADY,Taniesha Glanz  Subjective/Objective Assessment:   80 yr old female s/p left hip IM nailing     Action/Plan:   [facility]Soc[facility] worker is arranging.   Anticipated DC Date:  09/19/2013   Anticipated DC Plan:  SKILLED NURSING FACILITY  In-house referral  Clinical Social Worker      DC Planning Services  CM consult      [facility]  NA   Choice offered to / List presented to:             Status of service:  Completed, signed off Medicare Important Message given?   (If response is "NO", the following Medicare IM given date fields will be blank) Date Medicare IM given:   Date Additional Medicare IM given:    Discharge Disposition:  SKILLED NURSING FACILITY

## 2013-09-19 NOTE — Discharge Summary (Signed)
Physician Discharge Summary  Lori [name] ZOX:[id number] DOB: 1920-06-15 DOA: 09/15/2013  PCP: Thayer HeadingsMACKENZIE,BRIAN, MD  Admit date: 09/15/2013 Discharge date: 09/19/2013  Time spent: greater than 30  Recommendations for Outpatient Follow-up:  1. 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 female who presents to the ED after a mechanical fall that occurred in a facility. She was walking with a walker, tripped, and fell on the walker. R hip pain, R hip shortened and rotated. Pain worse with movement, unable to bear weight.  Unfortunately patient has now fractured her R hip.  She has h/o frequent falls recently including L hip fracture in March of last year and a humerus fracture in June of last year.   Hospital Course:  Patient was admitted to hospitalist service. Orthopedics consulted. Patient had right hip intramedullary nail. Postoperatively she had acute on chronic anemia. Responded well to transfusion of 2 units of packed red blood cells. Hemoglobin has remained stable above 9. She has no evidence of ongoing bleeding. She was started on warfarin for DVT prophylaxis by orthopedics. She had been off the anticoagulation due to frequent falls. Warfarin a short term, for DVT prophylaxis only. Patient does work with physical therapy and remains clinically stable. She will be transferred to skilled nursing facility. She is almost therapeutic after several  days of 2.5 mg of warfarin. She will need monitoring of her hemoglobin, INR and blood glucoses while at skilled nursing facility.  Procedures:  Right trochanteric intramedullary nail  Consultations:  Orthopedics, Duda  Discharge Exam: Filed Vitals:   09/19/13 1200  BP:   Pulse:   Temp:   Resp: 16    General: Comfortable in chair. More lucid today. Eating lunch. Cardiovascular: Irregularly irregular Respiratory: Clear to auscultation bilaterally without wheezes rhonchi or rales Extremities: No clubbing cyanosis or edema  Discharge Orders   Future Orders Complete By Expires   Diet Carb Modified  As directed    Discharge instructions  As directed    Walk with assistance  As directed    Weight bearing as tolerated  As directed    Questions:     Laterality:     Extremity:         Medication List    STOP taking these medications       traMADol 50 MG tablet  Commonly known as:  ULTRAM      TAKE these medications       acetaminophen 500 MG tablet  Commonly known as:  TYLENOL  Take 1 tablet (500 mg total) by mouth every 6 (six) hours as needed for mild pain.     albuterol 108 (90 BASE) MCG/ACT inhaler  Commonly known as:  PROVENTIL HFA;VENTOLIN HFA  Inhale 2 puffs into the lungs every 6 (six) hours as needed for wheezing or shortness of breath.     aspirin EC 81 MG tablet  Take 1 tablet (81 mg total) by mouth daily.  beta carotene w/minerals tablet  Take 1 tablet by mouth daily.     budesonide 180 MCG/ACT inhaler  Commonly known as:  PULMICORT  Inhale 1 puff into the lungs 2 (two) times daily.     calcium carbonate 600 MG Tabs tablet  Commonly known as:  OS-CAL  Take 600 mg by mouth 2 (two) times daily with a meal.     diltiazem 90 MG tablet  Commonly known as:  CARDIZEM  Take 180 mg by mouth 2 (two) times daily.     ENSURE  Take 237 mLs by mouth 3 (three) times daily as needed (per patient request).     furosemide 20 MG tablet  Commonly known as:   LASIX  Take 20 mg by mouth daily.     guaiFENesin 600 MG 12 hr tablet  Commonly known as:  MUCINEX  Take 1 tablet (600 mg total) by mouth 2 (two) times daily.     HYDROcodone-acetaminophen 5-325 MG per tablet  Commonly known as:  NORCO  Take 1 tablet by mouth every 6 (six) hours as needed.     insulin aspart 100 UNIT/ML injection  Commonly known as:  novoLOG  Inject 0-9 Units into the skin 3 (three) times daily with meals.     mirtazapine 15 MG tablet  Commonly known as:  REMERON  Take 15 mg by mouth at bedtime.     ondansetron 4 MG tablet  Commonly known as:  ZOFRAN  Take 1 tablet (4 mg total) by mouth every 6 (six) hours as needed for nausea.     pantoprazole 40 MG tablet  Commonly known as:  PROTONIX  Take 40 mg by mouth every morning.     polyethylene glycol powder powder  Commonly known as:  GLYCOLAX/MIRALAX  Take 17 g by mouth every morning.     potassium chloride 10 MEQ tablet  Commonly known as:  K-DUR  Take 10 mEq by mouth daily.     sennosides-docusate sodium 8.6-50 MG tablet  Commonly known as:  SENOKOT-S  Take 1 tablet by mouth every morning.     Vitamin D-3 5000 UNITS Tabs  Take 1 tablet by mouth every morning.     warfarin 2.5 MG tablet  Commonly known as:  COUMADIN  Take 1 tablet (2.5 mg total) by mouth daily at 6 PM. Adjust to keep INR between 2.0 and 3.0. For a month only     zolpidem 5 MG tablet  Commonly known as:  AMBIEN  Take 5 mg by mouth at bedtime as needed for sleep.       Allergies  Allergen Reactions  . Aspirin Other (See Comments)    Unknown   . Indapamide Other (See Comments)    Unknown   . Metformin And Related Other (See Comments)    Unknown   . Penicillins Other (See Comments)    Unknown   . Sulfa Antibiotics Other (See Comments)    Unknown   . Tiotropium Bromide Monohydrate Other (See Comments)    Unknown        Follow-up Information   Follow up with DUDA,MARCUS V, MD In 2 weeks.   Specialty:  Orthopedic  Surgery   Contact information:   [address] [place] [address] [phone]        The results of significant diagnostics from this hospitalization (including imaging, microbiology, ancillary and laboratory) are listed below for reference.    Significant Diagnostic Studies: Dg Chest 1 View  09/15/2013  CLINICAL DATA:  Right hip pain after a fall. Shortness of breath due to lung cancer history and partial lung removal.  EXAM: CHEST - 1 VIEW  COMPARISON:  05/29/2013  FINDINGS: Borderline heart size with normal pulmonary vascularity. Emphysematous changes in the lungs with the perihilar interstitial changes likely due to chronic bronchitis. Previous patchy infiltrates have resolved. There is still some fluid or thickened pleura in the right costophrenic angle. Calcification of aorta. Old fracture deformity of the right shoulder.  IMPRESSION: Stable appearance of chronic changes in the chest. Interval resolution of previous patchy infiltrates. No active pulmonary disease.   Electronically Signed   By: Burman NievesWilliam  Stevens M.D.   On: 09/15/2013 22:16   Dg Hip Complete Right  09/15/2013   CLINICAL DATA:  Right hip pain after a fall.  EXAM: RIGHT HIP - COMPLETE 2+ VIEW  COMPARISON:  DG HIP COMPLETE 2+V*R* dated 03/03/2010  FINDINGS: New comminuted inter trochanteric fractures of the right hip with varus angulation of fracture fragments. No dislocation. Pelvis appears intact. Interval placement of a left hip hemiarthroplasty. Vascular calcifications.  IMPRESSION: Comminuted inter trochanteric fractures of the right hip with varus angulation.   Electronically Signed   By: Burman NievesWilliam  Stevens M.D.   On: 09/15/2013 22:14   Dg Hip Operative Right  09/16/2013   CLINICAL DATA:  Open reduction internal fixation for fracture  EXAM: DG OPERATIVE RIGHT HIP  TECHNIQUE: A single spot fluoroscopic AP image of the right hip is submitted.  COMPARISON:  September 15, 2013  FINDINGS: There is screw and rod fixation  through an intertrochanteric femur fracture. The tip of the screws in the proximal femoral head. There is no new fracture or dislocation. Alignment at the fracture site is essentially anatomic.  IMPRESSION: Alignment fracture site essentially anatomic. Tip of screw in proximal femoral head. No dislocation.   Electronically Signed   By: Bretta BangWilliam  Woodruff M.D.   On: 09/16/2013 16:50   Ct Head Wo Contrast  09/15/2013   CLINICAL DATA:  Right hip pain after a fall.  EXAM: CT HEAD WITHOUT CONTRAST  TECHNIQUE: Contiguous axial images were obtained from the base of the skull through the vertex without intravenous contrast.  COMPARISON:  CT HEAD W/O CM dated 11/14/2012  FINDINGS: Diffuse cerebral atrophy. Ventricular dilatation consistent with central atrophy. Patchy low-attenuation changes in the deep white matter consistent with small vessel ischemia. No significant changes since previous study. No mass effect or midline shift. No abnormal extra-axial fluid collections. Gray-white matter junctions are distinct. Basal cisterns are not effaced. No evidence of acute intracranial hemorrhage. No depressed skull fractures. Visualized paranasal sinuses and mastoid air cells are not opacified.  IMPRESSION: No acute intracranial abnormalities. Chronic atrophy and small vessel ischemic changes.   Electronically Signed   By: Burman NievesWilliam  Stevens M.D.   On: 09/15/2013 22:06   EKG Atrial fibrillation Anterior infarct, old  Microbiology: Recent Results (from the past 240 hour(s))  SURGICAL PCR SCREEN     Status: Abnormal   Collection Time    09/16/13  6:00 AM      Result Value Ref Range Status   MRSA, PCR POSITIVE (*) NEGATIVE Final   Comment: RESULT CALLED TO, READ BACK BY AND VERIFIED WITH:     K.CORBETT,RN [id number] 09/16/13 M.CAMPBELL   Staphylococcus aureus POSITIVE (*) NEGATIVE Final   Comment:            The Xpert SA Assay (FDA     approved for NASAL specimens     in  patients over 50 years of age),     is one  component of     a comprehensive surveillance     program.  Test performance has     been validated by [employer] for patients greater     than or equal to 37 year old.     It is not intended     to diagnose infection nor to     guide or monitor treatment.     Labs: Basic Metabolic Panel:  Recent Labs Lab 09/15/13 2230 09/17/13 1930 09/18/13 0800  NA 138 137 139  K 4.7 4.8 5.0  CL 105 108 109  CO2 24 21 21   GLUCOSE 169* 161* 140*  BUN 37* 31* 32*  CREATININE 1.43* 1.24* 1.29*  CALCIUM 9.5 8.7 9.0   Liver Function Tests: No results found for this basename: AST, ALT, ALKPHOS, BILITOT, PROT, ALBUMIN,  in the last 168 hours No results found for this basename: LIPASE, AMYLASE,  in the last 168 hours No results found for this basename: AMMONIA,  in the last 168 hours CBC:  Recent Labs Lab 09/15/13 2230 09/17/13 1930 09/18/13 0800 09/19/13 0550  WBC 9.2 9.1 8.2 9.9  NEUTROABS 6.5  --   --   --   HGB 9.3* 6.6* 9.4* 9.1*  HCT 29.2* 21.3* 29.3* 28.4*  MCV 88.8 91.4 88.3 87.1  PLT 262 165 165 178   Cardiac Enzymes: No results found for this basename: CKTOTAL, CKMB, CKMBINDEX, TROPONINI,  in the last 168 hours BNP: BNP (last 3 results)  CBG:  Recent Labs Lab 09/18/13 1202 09/18/13 1631 09/18/13 2143 09/19/13 0645 09/19/13 1113  GLUCAP 129* 165* 106* 140* 184*   Signed:  Christiane Ha, MD [facility] 09/19/2013, 1:01 PM

## 2013-09-19 NOTE — Progress Notes (Signed)
Clinical social worker assisted with patient discharge to skilled nursing facility, [facility].  CSW addressed all family questions and concerns. CSW copied chart and added all important documents. CSW also set up patient transportation with [facility]. Clinical Social Worker will sign off for now as social work intervention is no longer needed.   Thoma Paulsen, MSW, LCSWA [phone] 

## 2013-09-19 NOTE — Progress Notes (Signed)
ANTICOAGULATION CONSULT NOTE - Follow Up Consult  Pharmacy Consult for Coumadin Indication: VTE prophylaxis  Allergies  Allergen Reactions  . Aspirin Other (See Comments)    Unknown   . Indapamide Other (See Comments)    Unknown   . Metformin And Related Other (See Comments)    Unknown   . Penicillins Other (See Comments)    Unknown   . Sulfa Antibiotics Other (See Comments)    Unknown   . Tiotropium Bromide Monohydrate Other (See Comments)    Unknown     Patient Measurements: Height: 5\' 3"  (160 cm) Weight: 108 lb 11 oz (49.3 kg) IBW/kg (Calculated) : 52.4 Heparin Dosing Weight:    Vital Signs: Temp: 98.4 F (36.9 C) (04/21 0608) Temp src: Oral (04/21 0608) BP: 130/63 mmHg (04/21 0608) Pulse Rate: 82 (04/21 0608)  Labs:  Recent Labs  09/17/13 1930 09/18/13 0800 09/19/13 0550  HGB 6.6* 9.4* 9.1*  HCT 21.3* 29.3* 28.4*  PLT 165 165 178  LABPROT  --  16.0* 21.9*  INR  --  1.31 1.98*  CREATININE 1.24* 1.29*  --     Estimated Creatinine Clearance: 22.1 ml/min (by C-G formula based on Cr of 1.29).   Assessment: R hip fracture after fall  80 y/o F presented with R hip fx after falling at her facility. Patient has frequent falls with L hip fracture in March of last year and a humerus fracture in June of last year. Patient also has afib but was not on any chronic oral anticoagulation prior to admission. S/p THA with nail on 4/18 and on Coumadin for VTE propx. INR is up to 1.98 today and appears to be responding well to Coumadin. Plan for discharge to skilled nursing. Hgb 9.1, plt 178, s/p transfusion.  Goal of Therapy:  INR 2-3 Monitor platelets by anticoagulation protocol: Yes   Plan:  Repeat Coumadin 2.5mg  po x 1 tonight. F/u daily INR, f/u discharge plans  Estella HuskMichelle Anchor Dwan, Pharm.D., BCPS, AAHIVP Clinical Pharmacist Phone [phone] or [phone] 09/19/2013, 10:13 AM

## 2013-09-20 ENCOUNTER — Non-Acute Institutional Stay (SKILLED_NURSING_FACILITY): Payer: Medicare Other | Admitting: Internal Medicine

## 2013-09-20 DIAGNOSIS — D62 Acute posthemorrhagic anemia: Secondary | ICD-10-CM

## 2013-09-20 DIAGNOSIS — E1129 Type 2 diabetes mellitus with other diabetic kidney complication: Secondary | ICD-10-CM

## 2013-09-20 DIAGNOSIS — E1165 Type 2 diabetes mellitus with hyperglycemia: Secondary | ICD-10-CM

## 2013-09-20 DIAGNOSIS — IMO0002 Reserved for concepts with insufficient information to code with codable children: Secondary | ICD-10-CM

## 2013-09-20 DIAGNOSIS — I4891 Unspecified atrial fibrillation: Secondary | ICD-10-CM

## 2013-09-20 DIAGNOSIS — S72143A Displaced intertrochanteric fracture of unspecified femur, initial encounter for closed fracture: Secondary | ICD-10-CM

## 2013-09-20 DIAGNOSIS — S72141A Displaced intertrochanteric fracture of right femur, initial encounter for closed fracture: Secondary | ICD-10-CM

## 2013-09-21 ENCOUNTER — Encounter: Payer: Self-pay | Admitting: *Deleted

## 2013-09-22 DIAGNOSIS — I4891 Unspecified atrial fibrillation: Secondary | ICD-10-CM | POA: Insufficient documentation

## 2013-09-22 NOTE — Progress Notes (Signed)
HISTORY & PHYSICAL  DATE: 09/20/2013   FACILITY: [facility]  LEVEL OF CARE: SNF (31)  ALLERGIES:  Allergies  Allergen Reactions  . Aspirin Other (See Comments)    Unknown   . Indapamide Other (See Comments)    Unknown   . Metformin And Related Other (See Comments)    Unknown   . Penicillins Other (See Comments)    Unknown   . Sulfa Antibiotics Other (See Comments)    Unknown   . Tiotropium Bromide Monohydrate Other (See Comments)    Unknown     CHIEF COMPLAINT:  Manage right hip fracture, atrial fibrillation and acute blood loss anemia  HISTORY OF PRESENT ILLNESS: Patient is a 80 year old Caucasian female.  HIP FRACTURE: The patient had a mechanical fall and sustained a femur fracture.  Patient subsequently underwent surgical repair and tolerated the procedure well. Patient is admitted to this facility for short-term rehabilitation. Patient denies hip pain currently. No complications reported from the pain medications currently being used.  ATRIAL FIBRILLATION: the patients atrial fibrillation remains stable.  The patient denies DOE, tachycardia, orthopnea, transient neurological sx, pedal edema, palpitations, & PNDs.  No complications noted from the medications currently being used.  ANEMIA: The anemia has been stable. The patient denies fatigue, melena or hematochezia. No complications from the medications currently being used. Postoperatively the patient suffered acute blood loss anemia. She received 2 units of packed red blood cells. Last hemoglobin level was 9.  PAST MEDICAL HISTORY :  Past Medical History  Diagnosis Date  . Diabetes mellitus   . Hypertension   . Atrial fibrillation     with RVR  . Hyperlipidemia   . Peripheral neuropathy   . Gastric AVM   . GERD (gastroesophageal reflux disease)   . Diabetic Charcot foot   . Shortness of breath   . COPD (chronic obstructive pulmonary disease)     excerbation  . Anemia   .  Leukocytosis     PAST SURGICAL HISTORY: Past Surgical History  Procedure Laterality Date  . Tonsillectomy    . Lung removal, partial    . Thyroidectomy, partial    . Hip arthroplasty Left 08/18/2012    Procedure: ARTHROPLASTY BIPOLAR HIP;  Surgeon: Eldred MangesMark C Yates, MD;  Location: [facility];  Service: Orthopedics;  Laterality: Left;  . Intramedullary (im) nail intertrochanteric Right 09/16/2013    Procedure: INTRAMEDULLARY (IM) NAIL INTERTROCHANTRIC;  Surgeon: Nadara MustardMarcus V Duda, MD;  Location: [facility] OR;  Service: Orthopedics;  Laterality: Right;    SOCIAL HISTORY:  reports that she quit smoking about 11 years ago. Her smoking use included Cigarettes. She has a 5 pack-year smoking history. She does not have any smokeless tobacco history on file. She reports that she does not drink alcohol or use illicit drugs.  FAMILY HISTORY:  Family History  Problem Relation Age of Onset  . Cancer Mother [id number]    melanoma  . Aneurysm Father     stomach aneurysm    CURRENT MEDICATIONS: Reviewed per MAR/see medication list  REVIEW OF SYSTEMS:  See HPI otherwise 14 point ROS is negative.  PHYSICAL EXAMINATION  VS:  See VS section  GENERAL: no acute distress, normal body habitus EYES: conjunctivae normal, sclerae normal, normal eye lids MOUTH/THROAT: lips without lesions,no lesions in the mouth,tongue is without lesions,uvula elevates in midline NECK: supple, trachea midline, no neck masses, no thyroid tenderness, no thyromegaly LYMPHATICS: no LAN in the neck, no supraclavicular LAN RESPIRATORY:  breathing is even & unlabored, BS CTAB CARDIAC: Heart rate is irregularly irregular, no murmur,no extra heart sounds, no edema GI:  ABDOMEN: abdomen soft, normal BS, no masses, no tenderness  LIVER/SPLEEN: no hepatomegaly, no splenomegaly MUSCULOSKELETAL: HEAD: normal to inspection & palpation BACK: no kyphosis, scoliosis or spinal processes tenderness EXTREMITIES: LEFT UPPER EXTREMITY: Moderate range of motion,  normal strength & tone RIGHT UPPER EXTREMITY: minimal range of motion, normal strength & tone LEFT LOWER EXTREMITY:  Minimal range of motion, decreased strength & tone RIGHT LOWER EXTREMITY:  range of motion not tested due to surgery, decreased strength & tone PSYCHIATRIC: the patient is alert & oriented to person, affect & behavior appropriate  LABS/RADIOLOGY:  Labs reviewed: Basic Metabolic Panel:  Recent Labs  16/10/96 1446 05/27/13 1735  09/15/13 2230 09/17/13 1930 09/18/13 0800  NA 128* 132*  < > 138 137 139  K 4.4 4.8  < > 4.7 4.8 5.0  CL 96 101  < > 105 108 109  CO2 21 23  < > 24 21 21   GLUCOSE 357* 278*  < > 169* 161* 140*  BUN 38* 37*  < > 37* 31* 32*  CREATININE 1.47* 1.45*  < > 1.43* 1.24* 1.29*  CALCIUM 9.2 9.4  < > 9.5 8.7 9.0  MG  --  1.7  --   --   --   --   PHOS  --  2.3  --   --   --   --   < > = values in this interval not displayed. Liver Function Tests:  Recent Labs  05/27/13 1735  AST 19  ALT 13  ALKPHOS 97  BILITOT 0.4  PROT 7.8  ALBUMIN 3.1*   CBC:  Recent Labs  05/27/13 1446 05/27/13 2013  09/15/13 2230 09/17/13 1930 09/18/13 0800 09/19/13 0550  WBC 14.5* 12.9*  < > 9.2 9.1 8.2 9.9  NEUTROABS 11.2* 12.4*  --  6.5  --   --   --   HGB 9.8* 9.4*  < > 9.3* 6.6* 9.4* 9.1*  HCT 30.0* 28.9*  < > 29.2* 21.3* 29.3* 28.4*  MCV 91.5 91.2  < > 88.8 91.4 88.3 87.1  PLT 220 211  < > 262 165 165 178  < > = values in this interval not displayed.  CBG:  Recent Labs  09/19/13 0645 09/19/13 1113 09/19/13 1544  GLUCAP 140* 184* 194*    CT HEAD WITHOUT CONTRAST   TECHNIQUE: Contiguous axial images were obtained from the base of the skull through the vertex without intravenous contrast.   COMPARISON:  CT HEAD W/O CM dated 11/14/2012   FINDINGS: Diffuse cerebral atrophy. Ventricular dilatation consistent with central atrophy. Patchy low-attenuation changes in the deep white matter consistent with small vessel ischemia. No significant  changes since previous study. No mass effect or midline shift. No abnormal extra-axial fluid collections. Gray-white matter junctions are distinct. Basal cisterns are not effaced. No evidence of acute intracranial hemorrhage. No depressed skull fractures. Visualized paranasal sinuses and mastoid air cells are not opacified.   IMPRESSION: No acute intracranial abnormalities. Chronic atrophy and small vessel ischemic changes. RIGHT HIP - COMPLETE 2+ VIEW   COMPARISON:  DG HIP COMPLETE 2+V*R* dated 03/03/2010   FINDINGS: New comminuted inter trochanteric fractures of the right hip with varus angulation of fracture fragments. No dislocation. Pelvis appears intact. Interval placement of a left hip hemiarthroplasty. Vascular calcifications.   IMPRESSION: Comminuted inter trochanteric fractures of the right hip with varus angulation.  CHEST - 1 VIEW   COMPARISON:  05/29/2013   FINDINGS: Borderline heart size with normal pulmonary vascularity. Emphysematous changes in the lungs with the perihilar interstitial changes likely due to chronic bronchitis. Previous patchy infiltrates have resolved. There is still some fluid or thickened pleura in the right costophrenic angle. Calcification of aorta. Old fracture deformity of the right shoulder.   IMPRESSION: Stable appearance of chronic changes in the chest. Interval resolution of previous patchy infiltrates. No active pulmonary disease. DG OPERATIVE RIGHT HIP   TECHNIQUE: A single spot fluoroscopic AP image of the right hip is submitted.   COMPARISON:  September 15, 2013   FINDINGS: There is screw and rod fixation through an intertrochanteric femur fracture. The tip of the screws in the proximal femoral head. There is no new fracture or dislocation. Alignment at the fracture site is essentially anatomic.   IMPRESSION: Alignment fracture site essentially anatomic. Tip of screw in proximal femoral head. No dislocation.     ASSESSMENT/PLAN:  Right hip fracture -status post repair Acute blood loss anemia- status post transfusion. Recheck hemoglobin level. Atrial fibrillation-rate controlled Diabetes mellitus-continue current medications Chronic kidney disease stage III-recheck Check CBC and BMP  I have reviewed patient's medical records received at admission/from hospitalization.  CPT CODE: [id number]  Angela CoxGayani Y Dasanayaka, MD [facility] [phone]

## 2013-10-12 ENCOUNTER — Non-Acute Institutional Stay (SKILLED_NURSING_FACILITY): Payer: Medicare Other | Admitting: Adult Health

## 2013-10-12 ENCOUNTER — Encounter: Payer: Self-pay | Admitting: Adult Health

## 2013-10-12 DIAGNOSIS — F32A Depression, unspecified: Secondary | ICD-10-CM

## 2013-10-12 DIAGNOSIS — J449 Chronic obstructive pulmonary disease, unspecified: Secondary | ICD-10-CM

## 2013-10-12 DIAGNOSIS — S72143A Displaced intertrochanteric fracture of unspecified femur, initial encounter for closed fracture: Secondary | ICD-10-CM

## 2013-10-12 DIAGNOSIS — Z7901 Long term (current) use of anticoagulants: Secondary | ICD-10-CM

## 2013-10-12 DIAGNOSIS — IMO0002 Reserved for concepts with insufficient information to code with codable children: Secondary | ICD-10-CM

## 2013-10-12 DIAGNOSIS — I10 Essential (primary) hypertension: Secondary | ICD-10-CM

## 2013-10-12 DIAGNOSIS — E1129 Type 2 diabetes mellitus with other diabetic kidney complication: Secondary | ICD-10-CM

## 2013-10-12 DIAGNOSIS — K219 Gastro-esophageal reflux disease without esophagitis: Secondary | ICD-10-CM

## 2013-10-12 DIAGNOSIS — F329 Major depressive disorder, single episode, unspecified: Secondary | ICD-10-CM

## 2013-10-12 DIAGNOSIS — E1165 Type 2 diabetes mellitus with hyperglycemia: Secondary | ICD-10-CM

## 2013-10-12 DIAGNOSIS — F3289 Other specified depressive episodes: Secondary | ICD-10-CM

## 2013-10-12 DIAGNOSIS — I5032 Chronic diastolic (congestive) heart failure: Secondary | ICD-10-CM

## 2013-10-12 DIAGNOSIS — S72141A Displaced intertrochanteric fracture of right femur, initial encounter for closed fracture: Secondary | ICD-10-CM

## 2013-10-12 DIAGNOSIS — D62 Acute posthemorrhagic anemia: Secondary | ICD-10-CM

## 2013-10-12 NOTE — Progress Notes (Signed)
Patient ID: Lori [name], female   DOB: Apr 05, 1921, 80 y.o.   MRN: [id number]              PROGRESS NOTE  DATE: 10/12/2013   [facility]  LEVEL OF CARE: SNF (31)  Acute Visit  CHIEF COMPLAINT:  Discharge Notes  HISTORY OF PRESENT ILLNESS: This is a 80 year old female who is for [facility]. [facility]. She fell and sustained a right trochanteric fracture. She had IM nail done in the hospital  Patient was admitted to this facility for short-term rehabilitation after the patient's recent hospitalization.  Patient has completed SNF rehabilitation and therapy has cleared the patient for discharge.  Reassessment of ongoing problem(s):  HTN: Pt 's HTN remains stable.  Denies CP, sob, DOE, pedal edema, headaches, dizziness or visual disturbances.  No complications from the medications currently being used.  Last BP : 104/58  COPD: the COPD remains stable.  Pt denies sob, cough, wheezing or declining exercise tolerance.  No complications from the medications presently being used.  CHF:The patient does not relate significant weight changes, denies sob, DOE, orthopnea, PNDs, pedal edema, palpitations or chest pain.  CHF remains stable.  No complications form the medications being used.    PAST MEDICAL HISTORY : Reviewed.  No changes/see problem list  CURRENT MEDICATIONS: Reviewed per MAR/see medication list  REVIEW OF SYSTEMS:  GENERAL: no change in appetite, no fatigue, no weight changes, no fever, chills or weakness RESPIRATORY: no cough, SOB, DOE, wheezing, hemoptysis CARDIAC: no chest pain, edema or palpitations GI: no abdominal pain, diarrhea, constipation, heart burn, nausea or vomiting  PHYSICAL EXAMINATION  GENERAL: no acute distress, thin habitus EYES: conjunctivae normal, sclerae normal, normal eye lids NECK: supple, trachea midline, no neck masses, no thyroid  tenderness, no thyromegaly LYMPHATICS: no LAN in the neck, no supraclavicular LAN RESPIRATORY: breathing is even & unlabored, BS CTAB CARDIAC: irregularly irregular, no murmur,no extra heart sounds, no edema GI: abdomen soft, normal BS, no masses, no tenderness, no hepatomegaly, no splenomegaly EXTREMITIES:  Able to move all 4 extremities PSYCHIATRIC: the patient is alert & oriented to person, affect & behavior appropriate  LABS/RADIOLOGY: Labs reviewed: Basic Metabolic Panel:  Recent Labs  62/13/0812/27/14 1446 05/27/13 1735  09/15/13 2230 09/17/13 1930 09/18/13 0800  NA 128* 132*  < > 138 137 139  K 4.4 4.8  < > 4.7 4.8 5.0  CL 96 101  < > 105 108 109  CO2 21 23  < > 24 21 21   GLUCOSE 357* 278*  < > 169* 161* 140*  BUN 38* 37*  < > 37* 31* 32*  CREATININE 1.47* 1.45*  < > 1.43* 1.24* 1.29*  CALCIUM 9.2 9.4  < > 9.5 8.7 9.0  MG  --  1.7  --   --   --   --   PHOS  --  2.3  --   --   --   --   < > = values in this interval not displayed.  Liver Function Tests:  Recent Labs  05/27/13 1735  AST 19  ALT 13  ALKPHOS 97  BILITOT 0.4  PROT 7.8  ALBUMIN 3.1*   CBC:  Recent Labs  05/27/13 1446 05/27/13 2013  09/15/13 2230 09/17/13 1930 09/18/13 0800 09/19/13 0550  WBC 14.5* 12.9*  < > 9.2 9.1 8.2 9.9  NEUTROABS 11.2* 12.4*  --  6.5  --   --   --   HGB 9.8* 9.4*  < > 9.3* 6.6* 9.4* 9.1*  HCT 30.0* 28.9*  < > 29.2* 21.3* 29.3* 28.4*  MCV 91.5 91.2  < > 88.8 91.4 88.3 87.1  PLT 220 211  < > 262 165 165 178  < > = values in this interval not displayed.  CBG:  Recent Labs  09/19/13 0645 09/19/13 1113 09/19/13 1544  GLUCAP 140* 184* 194*     ASSESSMENT/PLAN:  Right hip fracture status post trochanteric IM nail - done with rehabilitation @ [facility] Anemia, acute blood loss - stable Depression - continue Remeron Chronic diastolic heart failure - stable; continue Lasix COPD - stable; continue Pulmicort and albuterol when necessary Hypertension - well  controlled; continue Cardizem Diabetes mellitus, type II - continue Humalog Insomnia - continue Ambien when necessary GERD - continue Protonix Long-term use of anticoagulants -  INR  = 4.7- suprathjerapeutic; hold Coumadin x 1 day and re-check INR on 10/13/13  I have filled out patient's discharge paperwork and written prescriptions.    Total discharge time: Less than 30 minutes Discharge time involved coordination of the discharge process with Child psychotherapistsocial worker, nursing staff and therapy department.    CPT CODE: [id number]  Ella BodoMonina Vargas - NP [facility] [phone]

## 2013-10-31 ENCOUNTER — Emergency Department (HOSPITAL_BASED_OUTPATIENT_CLINIC_OR_DEPARTMENT_OTHER)
Admission: EM | Admit: 2013-10-31 | Discharge: 2013-10-31 | Disposition: A | Discharge status: Other Acute Inpatient Hospital | Payer: Medicare HMO | Attending: Emergency Medicine | Admitting: Emergency Medicine

## 2013-10-31 ENCOUNTER — Emergency Department (HOSPITAL_BASED_OUTPATIENT_CLINIC_OR_DEPARTMENT_OTHER): Payer: Medicare HMO

## 2013-10-31 ENCOUNTER — Encounter (HOSPITAL_BASED_OUTPATIENT_CLINIC_OR_DEPARTMENT_OTHER): Payer: Self-pay | Admitting: Emergency Medicine

## 2013-10-31 DIAGNOSIS — M7110 Other infective bursitis, unspecified site: Secondary | ICD-10-CM

## 2013-10-31 DIAGNOSIS — R000 Tachycardia, unspecified: Secondary | ICD-10-CM | POA: Insufficient documentation

## 2013-10-31 DIAGNOSIS — M719 Bursopathy, unspecified: Principal | ICD-10-CM | POA: Insufficient documentation

## 2013-10-31 DIAGNOSIS — E119 Type 2 diabetes mellitus without complications: Secondary | ICD-10-CM | POA: Insufficient documentation

## 2013-10-31 DIAGNOSIS — M67919 Unspecified disorder of synovium and tendon, unspecified shoulder: Secondary | ICD-10-CM | POA: Insufficient documentation

## 2013-10-31 DIAGNOSIS — I10 Essential (primary) hypertension: Secondary | ICD-10-CM | POA: Insufficient documentation

## 2013-10-31 DIAGNOSIS — Z862 Personal history of diseases of the blood and blood-forming organs and certain disorders involving the immune mechanism: Secondary | ICD-10-CM | POA: Insufficient documentation

## 2013-10-31 DIAGNOSIS — Z853 Personal history of malignant neoplasm of breast: Secondary | ICD-10-CM | POA: Insufficient documentation

## 2013-10-31 DIAGNOSIS — M25411 Effusion, right shoulder: Secondary | ICD-10-CM

## 2013-10-31 DIAGNOSIS — Z79899 Other long term (current) drug therapy: Secondary | ICD-10-CM | POA: Insufficient documentation

## 2013-10-31 DIAGNOSIS — H9190 Unspecified hearing loss, unspecified ear: Secondary | ICD-10-CM | POA: Insufficient documentation

## 2013-10-31 DIAGNOSIS — G8929 Other chronic pain: Secondary | ICD-10-CM | POA: Insufficient documentation

## 2013-10-31 DIAGNOSIS — Z7982 Long term (current) use of aspirin: Secondary | ICD-10-CM | POA: Insufficient documentation

## 2013-10-31 HISTORY — DX: Essential (primary) hypertension: I10

## 2013-10-31 HISTORY — DX: Dysuria: R30.0

## 2013-10-31 HISTORY — DX: Generalized enlarged lymph nodes: R59.1

## 2013-10-31 HISTORY — DX: Other fatigue: R53.83

## 2013-10-31 HISTORY — DX: Anemia, unspecified: D64.9

## 2013-10-31 HISTORY — DX: Lymphedema, not elsewhere classified: I89.0

## 2013-10-31 HISTORY — DX: Hyperlipidemia, unspecified: E78.5

## 2013-10-31 HISTORY — DX: Type 2 diabetes mellitus without complications: E11.9

## 2013-10-31 HISTORY — DX: Vitamin D deficiency, unspecified: E55.9

## 2013-10-31 HISTORY — DX: Malignant neoplasm of unspecified site of unspecified female breast: C50.919

## 2013-10-31 LAB — CBC WITH DIFFERENTIAL/PLATELET
BASOS PCT: 0 % (ref 0–1)
Basophils Absolute: 0 10*3/uL (ref 0.0–0.1)
Eosinophils Absolute: 0 10*3/uL (ref 0.0–0.7)
Eosinophils Relative: 0 % (ref 0–5)
HCT: 31 % — ABNORMAL LOW (ref 36.0–46.0)
HEMOGLOBIN: 10.1 g/dL — AB (ref 12.0–15.0)
Lymphocytes Relative: 7 % — ABNORMAL LOW (ref 12–46)
Lymphs Abs: 0.6 10*3/uL — ABNORMAL LOW (ref 0.7–4.0)
MCH: 26.2 pg (ref 26.0–34.0)
MCHC: 32.6 g/dL (ref 30.0–36.0)
MCV: 80.3 fL (ref 78.0–100.0)
MONOS PCT: 6 % (ref 3–12)
Monocytes Absolute: 0.5 10*3/uL (ref 0.1–1.0)
NEUTROS ABS: 7.5 10*3/uL (ref 1.7–7.7)
Neutrophils Relative %: 87 % — ABNORMAL HIGH (ref 43–77)
PLATELETS: 269 10*3/uL (ref 150–400)
RBC: 3.86 MIL/uL — AB (ref 3.87–5.11)
RDW: 15.9 % — ABNORMAL HIGH (ref 11.5–15.5)
WBC: 8.6 10*3/uL (ref 4.0–10.5)

## 2013-10-31 LAB — COMPREHENSIVE METABOLIC PANEL
ALT: 6 U/L (ref 0–35)
AST: 18 U/L (ref 0–37)
Albumin: 3.6 g/dL (ref 3.5–5.2)
Alkaline Phosphatase: 49 U/L (ref 39–117)
BILIRUBIN TOTAL: 0.2 mg/dL — AB (ref 0.3–1.2)
BUN: 12 mg/dL (ref 6–23)
CHLORIDE: 94 meq/L — AB (ref 96–112)
CO2: 24 mEq/L (ref 19–32)
Calcium: 10 mg/dL (ref 8.4–10.5)
Creatinine, Ser: 0.7 mg/dL (ref 0.50–1.10)
GFR calc Af Amer: 85 mL/min — ABNORMAL LOW (ref >90)
GFR calc non Af Amer: 73 mL/min — ABNORMAL LOW (ref >90)
Glucose, Bld: 126 mg/dL — ABNORMAL HIGH (ref 70–99)
Potassium: 4 mEq/L (ref 3.7–5.3)
SODIUM: 133 meq/L — AB (ref 137–147)
Total Protein: 7.6 g/dL (ref 6.0–8.3)

## 2013-10-31 LAB — SYNOVIAL CELL COUNT + DIFF, W/ CRYSTALS
CRYSTALS FLUID: NONE SEEN
Lymphocytes-Synovial Fld: 3 % (ref 0–20)
MONOCYTE-MACROPHAGE-SYNOVIAL FLUID: 2 % — AB (ref 50–90)
Neutrophil, Synovial: 95 % — ABNORMAL HIGH (ref 0–25)
WBC, Synovial: 51135 /mm3 — ABNORMAL HIGH (ref 0–200)

## 2013-10-31 LAB — URINALYSIS, ROUTINE W REFLEX MICROSCOPIC
BILIRUBIN URINE: NEGATIVE
GLUCOSE, UA: NEGATIVE mg/dL
HGB URINE DIPSTICK: NEGATIVE
Ketones, ur: NEGATIVE mg/dL
Leukocytes, UA: NEGATIVE
Nitrite: NEGATIVE
PROTEIN: NEGATIVE mg/dL
Specific Gravity, Urine: 1.007 (ref 1.005–1.030)
Urobilinogen, UA: 0.2 mg/dL (ref 0.0–1.0)
pH: 6 (ref 5.0–8.0)

## 2013-10-31 LAB — CBG MONITORING, ED: Glucose-Capillary: 92 mg/dL (ref 70–99)

## 2013-10-31 LAB — PROTEIN, SYNOVIAL FLUID: PROTEIN, SYNOVIAL FLUID: 4 g/dL — AB (ref 1.0–3.0)

## 2013-10-31 LAB — GLUCOSE, SYNOVIAL FLUID: Glucose, Synovial Fluid: 12 mg/dL

## 2013-10-31 LAB — I-STAT CG4 LACTIC ACID, ED: Lactic Acid, Venous: 0.94 mmol/L (ref 0.5–2.2)

## 2013-10-31 MED ORDER — SODIUM CHLORIDE 0.9 % IV BOLUS (SEPSIS)
500.0000 mL | Freq: Once | INTRAVENOUS | Status: AC
  Administered 2013-10-31: 500 mL via INTRAVENOUS

## 2013-10-31 MED ORDER — CEFTRIAXONE SODIUM 1 G IJ SOLR
INTRAMUSCULAR | Status: AC
  Administered 2013-10-31: 1000 mg
  Filled 2013-10-31: qty 10

## 2013-10-31 MED ORDER — DEXTROSE 5 % IV SOLN: 1.0000 g | Freq: Once | INTRAVENOUS | Status: DC

## 2013-10-31 MED ORDER — VANCOMYCIN HCL IN DEXTROSE 1-5 GM/200ML-% IV SOLN
1000.0000 mg | Freq: Once | INTRAVENOUS | Status: AC
  Administered 2013-10-31: 1000 mg via INTRAVENOUS
  Filled 2013-10-31: qty 200

## 2013-10-31 NOTE — ED Notes (Signed)
Niece states that pt is being treated for lymphedema to right arm with swelling to right shoulder-here to ED today with c/o increase in pain/swelling to shoulder-pt with stocking glove and wrap to RUE-niece asked for it not to be removed until EDP in to examine due to "it will swell up fast if you take that off"

## 2013-10-31 NOTE — ED Notes (Signed)
[facility] at bedside preparing for transport.

## 2013-10-31 NOTE — ED Provider Notes (Signed)
CSN: [id number]     Arrival date & time 10/31/13  1209 History   First MD Initiated Contact with Patient 10/31/13 1216     Chief Complaint  Patient presents with  . Shoulder Pain     (Consider location/radiation/quality/duration/timing/severity/associated sxs/prior Treatment) HPI The history is limited due to patient extremely hard of hearing. 80 y.o. femal who presents today complaining of pain and swelling of right shoulder.  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  Patient has ongoing pain and swelling of right upper extremity and wears a compression device. She also has a history of previous right rotator cuff tear with some chronic shoulder pain and swelling. Her niece checks on her and her sister who live together.  The home health nurse called the niece today because she noted a worsening of swelling in the right shoulder .  There is no history of trauma or fall.  Patient was by home health nurse who advised that she  evaluated in the ed.  There is some erythema of the surrounding area with swelling and pain.  Patient has baseline swelling of remainder of arm.  She was told she had a fever at her pmd but was not given anything.   Past Medical History  Diagnosis Date  . Diabetes mellitus without complication   . Hypertension   . Lymphadenopathy   . Lymphedema   . Anemia   . Dysuria   . Vitamin D deficiency   . Hyperlipidemia   . Fatigue   . Breast cancer    Past Surgical History  Procedure Laterality Date  . Breast surgery     No family history on file. History  Substance Use Topics  . Smoking status: Never Smoker   . Smokeless tobacco: Not on file  . Alcohol Use: No   OB History   Grav Para Term Preterm Abortions TAB SAB Ect Mult Living                 Review of Systems  All other systems reviewed and are negative.     Allergies  Review of patient's allergies indicates not on file.  Home Medications   Prior to  Admission medications   Medication Sig Start Date End Date Taking? Authorizing Provider  aspirin 81 MG tablet Take 81 mg by mouth daily.   Yes Historical Provider, MD  glimepiride (AMARYL) 1 MG tablet Take 1 mg by mouth daily with breakfast.   Yes Historical Provider, MD  lidocaine (LIDODERM) 5 % Place 1 patch onto the skin daily. Remove & Discard patch within 12 hours or as directed by MD   Yes Historical Provider, MD  lisinopril (PRINIVIL,ZESTRIL) 20 MG tablet Take 20 mg by mouth daily.   Yes Historical Provider, MD  minoxidil (LONITEN) 10 MG tablet Take 10 mg by mouth 2 (two) times daily.   Yes Historical Provider, MD   BP 130/59  Pulse 110  Temp(Src) 98.8 F (37.1 C) (Rectal)  Resp 18  Ht 5\' 5"  (1.651 m)  Wt 127 lb (57.607 kg)  BMI 21.13 kg/m2  SpO2 98% Physical Exam  Nursing note and vitals reviewed. Constitutional: She appears well-developed and well-nourished.  HENT:  Head: Normocephalic and atraumatic.  Nose: Nose normal.  Mouth/Throat: Oropharynx is clear and moist.  Eyes: Conjunctivae are normal. Pupils are equal, round, and reactive to light.  Neck: Normal range of motion. Neck supple.  Cardiovascular: Tachycardia present.   Pulmonary/Chest: Effort normal and breath sounds normal.  Abdominal: Soft. Bowel sounds are normal.  Musculoskeletal:       Arms: Swelling with fluctuance right shoulder, diffuse swelling rue, radial pulse intact, some skin erythema and warmth.     ED Course  ARTHOCENTESIS Date/Time: 10/31/2013 3:49 PM Performed by: Shaune Pollack Authorized by: Shaune Pollack Consent: written consent obtained. Risks and benefits: risks, benefits and alternatives were discussed Consent given by: patient and power of attorney Patient understanding: patient states understanding of the procedure being performed Patient consent: the patient's understanding of the procedure matches consent given Patient identity confirmed: verbally with patient Time out:  Immediately prior to procedure a "time out" was called to verify the correct patient, procedure, equipment, support staff and site/side marked as required. Indications: joint swelling,  pain,  possible septic joint and diagnostic evaluation  Body area: shoulder Joint: right shoulder Local anesthesia used: yes Anesthesia: local infiltration Local anesthetic: lidocaine 1% with epinephrine Patient sedated: no Preparation: Patient was prepped and draped in the usual sterile fashion. Needle gauge: 18 G Ultrasound guidance: no Approach: anterior Aspirate: cloudy and yellow Patient tolerance: Patient tolerated the procedure well with no immediate complications.   (including critical care time) Labs Review Labs Reviewed  CBC WITH DIFFERENTIAL - Abnormal; Notable for the following:    RBC 3.86 (*)    Hemoglobin 10.1 (*)    HCT 31.0 (*)    RDW 15.9 (*)    Neutrophils Relative % 87 (*)    Lymphocytes Relative 7 (*)    Lymphs Abs 0.6 (*)    All other components within normal limits  COMPREHENSIVE METABOLIC PANEL - Abnormal; Notable for the following:    Sodium 133 (*)    Chloride 94 (*)    Glucose, Bld 126 (*)    Total Bilirubin 0.2 (*)    GFR calc non Af Amer 73 (*)    GFR calc Af Amer 85 (*)    All other components within normal limits  CULTURE, BLOOD (ROUTINE X 2)  CULTURE, BLOOD (ROUTINE X 2)  BODY FLUID CULTURE  URINALYSIS, ROUTINE W REFLEX MICROSCOPIC  LACTIC ACID, PLASMA  BODY FLUID CRYSTAL  BODY FLUID CELL COUNT WITH DIFFERENTIAL  GLUCOSE, SEROUS FLUID  SYNOVIAL CELL COUNT + DIFF, W/ CRYSTALS  GLUCOSE, SYNOVIAL FLUID  PROTEIN, SYNOVIAL FLUID  I-STAT CG4 LACTIC ACID, ED    Imaging Review Dg Shoulder Right  10/31/2013   CLINICAL DATA:  Shoulder pain and swelling  EXAM: RIGHT SHOULDER - 2+ VIEW  COMPARISON:  None.  FINDINGS: The humeral head is somewhat high-riding which may indicate an underlying chronic rotator cuff injury. A large predominately isodense collection is  identified lateral to the shoulder joint. There are small foci of air within in this likely represents an infected bursitis. No areas of bony erosion are seen. Degenerative changes of the acromioclavicular joint are seen.  IMPRESSION: Changes consistent with an infected bursitis. Surgical consultation is recommended.   Electronically Signed   By: Inez Catalina M.D.   On: 10/31/2013 13:26     EKG Interpretation None      MDM   Final diagnoses:  None    Patient presents with swelling and pain to right shoulder with history of chronic rue lymphedema.  Patient being assessed for septic arthritis vs bursitis and   Discussed with Dr. Reather Converse and he will facilitate further treatment and likely transfer to [facility].  Her orthopedist is Dr. Onnie Graham and primary is Dr. Holley Raring.   Shaune Pollack, MD 10/31/13 [phone]

## 2013-10-31 NOTE — ED Notes (Signed)
Family informed of plan of care and admission.  Awaiting bed assignment.

## 2013-11-01 LAB — PATHOLOGIST SMEAR REVIEW

## 2013-11-03 LAB — BODY FLUID CULTURE
Culture: NO GROWTH
SPECIAL REQUESTS: NORMAL

## 2013-11-06 LAB — CULTURE, BLOOD (ROUTINE X 2)
CULTURE: NO GROWTH
Culture: NO GROWTH

## 2013-11-19 ENCOUNTER — Encounter (HOSPITAL_COMMUNITY): Payer: Self-pay | Admitting: Emergency Medicine

## 2013-11-19 ENCOUNTER — Inpatient Hospital Stay (HOSPITAL_COMMUNITY)
Admission: EM | Admit: 2013-11-19 | Discharge: 2013-11-29 | DRG: 871 | Disposition: E | Payer: Medicare Other | Attending: Internal Medicine | Admitting: Internal Medicine

## 2013-11-19 ENCOUNTER — Emergency Department (HOSPITAL_COMMUNITY): Payer: Medicare Other

## 2013-11-19 DIAGNOSIS — R0681 Apnea, not elsewhere classified: Secondary | ICD-10-CM | POA: Diagnosis not present

## 2013-11-19 DIAGNOSIS — Z886 Allergy status to analgesic agent status: Secondary | ICD-10-CM

## 2013-11-19 DIAGNOSIS — L03113 Cellulitis of right upper limb: Secondary | ICD-10-CM | POA: Diagnosis present

## 2013-11-19 DIAGNOSIS — N179 Acute kidney failure, unspecified: Secondary | ICD-10-CM | POA: Diagnosis present

## 2013-11-19 DIAGNOSIS — E46 Unspecified protein-calorie malnutrition: Secondary | ICD-10-CM | POA: Diagnosis present

## 2013-11-19 DIAGNOSIS — Z882 Allergy status to sulfonamides status: Secondary | ICD-10-CM

## 2013-11-19 DIAGNOSIS — I2789 Other specified pulmonary heart diseases: Secondary | ICD-10-CM | POA: Diagnosis present

## 2013-11-19 DIAGNOSIS — D72829 Elevated white blood cell count, unspecified: Secondary | ICD-10-CM | POA: Diagnosis present

## 2013-11-19 DIAGNOSIS — E860 Dehydration: Secondary | ICD-10-CM | POA: Diagnosis present

## 2013-11-19 DIAGNOSIS — Z681 Body mass index (BMI) 19 or less, adult: Secondary | ICD-10-CM

## 2013-11-19 DIAGNOSIS — L0390 Cellulitis, unspecified: Secondary | ICD-10-CM

## 2013-11-19 DIAGNOSIS — E1165 Type 2 diabetes mellitus with hyperglycemia: Secondary | ICD-10-CM | POA: Diagnosis present

## 2013-11-19 DIAGNOSIS — G609 Hereditary and idiopathic neuropathy, unspecified: Secondary | ICD-10-CM | POA: Diagnosis present

## 2013-11-19 DIAGNOSIS — R6520 Severe sepsis without septic shock: Secondary | ICD-10-CM

## 2013-11-19 DIAGNOSIS — F3289 Other specified depressive episodes: Secondary | ICD-10-CM | POA: Diagnosis present

## 2013-11-19 DIAGNOSIS — X58XXXA Exposure to other specified factors, initial encounter: Secondary | ICD-10-CM

## 2013-11-19 DIAGNOSIS — Z87891 Personal history of nicotine dependence: Secondary | ICD-10-CM

## 2013-11-19 DIAGNOSIS — Z880 Allergy status to penicillin: Secondary | ICD-10-CM

## 2013-11-19 DIAGNOSIS — I10 Essential (primary) hypertension: Secondary | ICD-10-CM | POA: Diagnosis present

## 2013-11-19 DIAGNOSIS — E875 Hyperkalemia: Secondary | ICD-10-CM | POA: Diagnosis present

## 2013-11-19 DIAGNOSIS — J4489 Other specified chronic obstructive pulmonary disease: Secondary | ICD-10-CM | POA: Diagnosis present

## 2013-11-19 DIAGNOSIS — T148XXA Other injury of unspecified body region, initial encounter: Secondary | ICD-10-CM

## 2013-11-19 DIAGNOSIS — R4181 Age-related cognitive decline: Secondary | ICD-10-CM | POA: Diagnosis present

## 2013-11-19 DIAGNOSIS — F32A Depression, unspecified: Secondary | ICD-10-CM | POA: Diagnosis present

## 2013-11-19 DIAGNOSIS — Z808 Family history of malignant neoplasm of other organs or systems: Secondary | ICD-10-CM

## 2013-11-19 DIAGNOSIS — R000 Tachycardia, unspecified: Secondary | ICD-10-CM | POA: Diagnosis present

## 2013-11-19 DIAGNOSIS — Z66 Do not resuscitate: Secondary | ICD-10-CM | POA: Diagnosis present

## 2013-11-19 DIAGNOSIS — F0390 Unspecified dementia without behavioral disturbance: Secondary | ICD-10-CM | POA: Diagnosis present

## 2013-11-19 DIAGNOSIS — F329 Major depressive disorder, single episode, unspecified: Secondary | ICD-10-CM | POA: Diagnosis present

## 2013-11-19 DIAGNOSIS — Z85118 Personal history of other malignant neoplasm of bronchus and lung: Secondary | ICD-10-CM

## 2013-11-19 DIAGNOSIS — E1129 Type 2 diabetes mellitus with other diabetic kidney complication: Secondary | ICD-10-CM | POA: Diagnosis present

## 2013-11-19 DIAGNOSIS — A419 Sepsis, unspecified organism: Secondary | ICD-10-CM | POA: Diagnosis present

## 2013-11-19 DIAGNOSIS — S41109A Unspecified open wound of unspecified upper arm, initial encounter: Secondary | ICD-10-CM | POA: Diagnosis present

## 2013-11-19 DIAGNOSIS — R6521 Severe sepsis with septic shock: Secondary | ICD-10-CM | POA: Diagnosis present

## 2013-11-19 DIAGNOSIS — D649 Anemia, unspecified: Secondary | ICD-10-CM | POA: Diagnosis present

## 2013-11-19 DIAGNOSIS — I5032 Chronic diastolic (congestive) heart failure: Secondary | ICD-10-CM | POA: Diagnosis present

## 2013-11-19 DIAGNOSIS — N183 Chronic kidney disease, stage 3 unspecified: Secondary | ICD-10-CM | POA: Diagnosis present

## 2013-11-19 DIAGNOSIS — E785 Hyperlipidemia, unspecified: Secondary | ICD-10-CM | POA: Diagnosis present

## 2013-11-19 DIAGNOSIS — I129 Hypertensive chronic kidney disease with stage 1 through stage 4 chronic kidney disease, or unspecified chronic kidney disease: Secondary | ICD-10-CM | POA: Diagnosis present

## 2013-11-19 DIAGNOSIS — Z993 Dependence on wheelchair: Secondary | ICD-10-CM

## 2013-11-19 DIAGNOSIS — I4891 Unspecified atrial fibrillation: Secondary | ICD-10-CM | POA: Diagnosis present

## 2013-11-19 DIAGNOSIS — K219 Gastro-esophageal reflux disease without esophagitis: Secondary | ICD-10-CM | POA: Diagnosis present

## 2013-11-19 DIAGNOSIS — Z888 Allergy status to other drugs, medicaments and biological substances status: Secondary | ICD-10-CM

## 2013-11-19 DIAGNOSIS — Z515 Encounter for palliative care: Secondary | ICD-10-CM

## 2013-11-19 DIAGNOSIS — Y9210 Unspecified residential institution as the place of occurrence of the external cause: Secondary | ICD-10-CM | POA: Diagnosis present

## 2013-11-19 DIAGNOSIS — N058 Unspecified nephritic syndrome with other morphologic changes: Secondary | ICD-10-CM | POA: Diagnosis present

## 2013-11-19 DIAGNOSIS — Z96649 Presence of unspecified artificial hip joint: Secondary | ICD-10-CM

## 2013-11-19 DIAGNOSIS — E1149 Type 2 diabetes mellitus with other diabetic neurological complication: Secondary | ICD-10-CM | POA: Diagnosis present

## 2013-11-19 DIAGNOSIS — L0291 Cutaneous abscess, unspecified: Secondary | ICD-10-CM

## 2013-11-19 DIAGNOSIS — IMO0002 Reserved for concepts with insufficient information to code with codable children: Secondary | ICD-10-CM | POA: Diagnosis present

## 2013-11-19 DIAGNOSIS — J449 Chronic obstructive pulmonary disease, unspecified: Secondary | ICD-10-CM | POA: Diagnosis present

## 2013-11-19 DIAGNOSIS — Z9181 History of falling: Secondary | ICD-10-CM

## 2013-11-19 LAB — TROPONIN I
Troponin I: <0.3 ng/mL (ref <0.30)
Troponin I: <0.3 ng/mL (ref <0.30)

## 2013-11-19 LAB — CBC WITH DIFFERENTIAL/PLATELET
Basophils Absolute: 0 10*3/uL (ref 0.0–0.1)
Basophils Relative: 0 % (ref 0–1)
EOS PCT: 0 % (ref 0–5)
Eosinophils Absolute: 0 10*3/uL (ref 0.0–0.7)
HEMATOCRIT: 35.9 % — AB (ref 36.0–46.0)
HEMOGLOBIN: 11.5 g/dL — AB (ref 12.0–15.0)
LYMPHS ABS: 0.6 10*3/uL — AB (ref 0.7–4.0)
LYMPHS PCT: 4 % — AB (ref 12–46)
MCH: 28.9 pg (ref 26.0–34.0)
MCHC: 32 g/dL (ref 30.0–36.0)
MCV: 90.2 fL (ref 78.0–100.0)
MONOS PCT: 5 % (ref 3–12)
Monocytes Absolute: 0.7 10*3/uL (ref 0.1–1.0)
NEUTROS ABS: 12.8 10*3/uL — AB (ref 1.7–7.7)
Neutrophils Relative %: 91 % — ABNORMAL HIGH (ref 43–77)
Platelets: 281 10*3/uL (ref 150–400)
RBC: 3.98 MIL/uL (ref 3.87–5.11)
RDW: 14.5 % (ref 11.5–15.5)
WBC Morphology: INCREASED
WBC: 14.1 10*3/uL — AB (ref 4.0–10.5)

## 2013-11-19 LAB — BASIC METABOLIC PANEL
BUN: 35 mg/dL — ABNORMAL HIGH (ref 6–23)
CHLORIDE: 104 meq/L (ref 96–112)
CO2: 21 meq/L (ref 19–32)
Calcium: 8.8 mg/dL (ref 8.4–10.5)
Creatinine, Ser: 1.09 mg/dL (ref 0.50–1.10)
GFR calc Af Amer: 50 mL/min — ABNORMAL LOW (ref >90)
GFR calc non Af Amer: 43 mL/min — ABNORMAL LOW (ref >90)
Glucose, Bld: 303 mg/dL — ABNORMAL HIGH (ref 70–99)
Potassium: 4.9 mEq/L (ref 3.7–5.3)
Sodium: 136 mEq/L — ABNORMAL LOW (ref 137–147)

## 2013-11-19 LAB — GLUCOSE, CAPILLARY
Glucose-Capillary: 270 mg/dL — ABNORMAL HIGH (ref 70–99)
Glucose-Capillary: 151 mg/dL — ABNORMAL HIGH (ref 70–99)

## 2013-11-19 LAB — COMPREHENSIVE METABOLIC PANEL
ALBUMIN: 3 g/dL — AB (ref 3.5–5.2)
ALT: 14 U/L (ref 0–35)
AST: 33 U/L (ref 0–37)
Alkaline Phosphatase: 129 U/L — ABNORMAL HIGH (ref 39–117)
BILIRUBIN TOTAL: 0.4 mg/dL (ref 0.3–1.2)
BUN: 38 mg/dL — AB (ref 6–23)
CHLORIDE: 99 meq/L (ref 96–112)
CO2: 21 meq/L (ref 19–32)
Calcium: 9.9 mg/dL (ref 8.4–10.5)
Creatinine, Ser: 1.16 mg/dL — ABNORMAL HIGH (ref 0.50–1.10)
GFR calc Af Amer: 46 mL/min — ABNORMAL LOW (ref >90)
GFR, EST NON AFRICAN AMERICAN: 40 mL/min — AB (ref >90)
Glucose, Bld: 320 mg/dL — ABNORMAL HIGH (ref 70–99)
POTASSIUM: 5.6 meq/L — AB (ref 3.7–5.3)
Sodium: 134 mEq/L — ABNORMAL LOW (ref 137–147)
Total Protein: 8.3 g/dL (ref 6.0–8.3)

## 2013-11-19 LAB — URINE MICROSCOPIC-ADD ON

## 2013-11-19 LAB — URINALYSIS, ROUTINE W REFLEX MICROSCOPIC
Bilirubin Urine: NEGATIVE
Glucose, UA: >1000 mg/dL — AB
HGB URINE DIPSTICK: NEGATIVE
Ketones, ur: NEGATIVE mg/dL
Leukocytes, UA: NEGATIVE
NITRITE: NEGATIVE
PH: 5 (ref 5.0–8.0)
Protein, ur: NEGATIVE mg/dL
Specific Gravity, Urine: 1.021 (ref 1.005–1.030)
Urobilinogen, UA: 0.2 mg/dL (ref 0.0–1.0)

## 2013-11-19 LAB — I-STAT CG4 LACTIC ACID, ED: LACTIC ACID, VENOUS: 4.91 mmol/L — AB (ref 0.5–2.2)

## 2013-11-19 LAB — PROCALCITONIN: PROCALCITONIN: 10.47 ng/mL

## 2013-11-19 LAB — HEMOGLOBIN A1C
Hgb A1c MFr Bld: 6.1 % — ABNORMAL HIGH (ref <5.7)
MEAN PLASMA GLUCOSE: 128 mg/dL — AB (ref <117)

## 2013-11-19 LAB — PROTIME-INR
INR: 1.22 (ref 0.00–1.49)
Prothrombin Time: 15.1 seconds (ref 11.6–15.2)

## 2013-11-19 LAB — TSH: TSH: 1.89 u[IU]/mL (ref 0.350–4.500)

## 2013-11-19 LAB — MAGNESIUM: MAGNESIUM: 1.2 mg/dL — AB (ref 1.5–2.5)

## 2013-11-19 LAB — MRSA PCR SCREENING: MRSA by PCR: NEGATIVE

## 2013-11-19 MED ORDER — DIGOXIN 0.25 MG/ML IJ SOLN
0.2500 mg | Freq: Three times a day (TID) | INTRAMUSCULAR | Status: DC
  Administered 2013-11-19 – 2013-11-20 (×2): 0.25 mg via INTRAVENOUS
  Filled 2013-11-19 (×3): qty 1

## 2013-11-19 MED ORDER — DEXTROSE 5 % IV SOLN
5.0000 mg/h | INTRAVENOUS | Status: DC
  Administered 2013-11-19: 5 mg/h via INTRAVENOUS
  Filled 2013-11-19: qty 100

## 2013-11-19 MED ORDER — PIPERACILLIN-TAZOBACTAM 3.375 G IVPB 30 MIN
3.3750 g | Freq: Once | INTRAVENOUS | Status: AC
  Administered 2013-11-19: 3.375 g via INTRAVENOUS
  Filled 2013-11-19: qty 50

## 2013-11-19 MED ORDER — ENOXAPARIN SODIUM 30 MG/0.3ML ~~LOC~~ SOLN: 30.0000 mg | SUBCUTANEOUS | Status: DC

## 2013-11-19 MED ORDER — DOCUSATE SODIUM 100 MG PO CAPS
100.0000 mg | ORAL_CAPSULE | Freq: Two times a day (BID) | ORAL | Status: DC
  Administered 2013-11-19: 100 mg via ORAL
  Filled 2013-11-19: qty 1

## 2013-11-19 MED ORDER — ONDANSETRON HCL 4 MG/2ML IJ SOLN: 4.0000 mg | Freq: Four times a day (QID) | INTRAMUSCULAR | Status: DC | PRN

## 2013-11-19 MED ORDER — ONDANSETRON HCL 4 MG PO TABS: 4.0000 mg | ORAL_TABLET | Freq: Four times a day (QID) | ORAL | Status: DC | PRN

## 2013-11-19 MED ORDER — LEVALBUTEROL HCL 0.63 MG/3ML IN NEBU: 0.6300 mg | INHALATION_SOLUTION | Freq: Once | RESPIRATORY_TRACT | Status: DC

## 2013-11-19 MED ORDER — POLYETHYLENE GLYCOL 3350 17 G PO PACK: 17.0000 g | PACK | Freq: Every day | ORAL | Status: DC | PRN

## 2013-11-19 MED ORDER — POLYETHYLENE GLYCOL 3350 17 GM/SCOOP PO POWD: 17.0000 g | Freq: Every morning | ORAL | Status: DC

## 2013-11-19 MED ORDER — BIOTENE DRY MOUTH MT LIQD
15.0000 mL | Freq: Two times a day (BID) | OROMUCOSAL | Status: DC
  Administered 2013-11-21 (×2): 15 mL via OROMUCOSAL

## 2013-11-19 MED ORDER — DILTIAZEM LOAD VIA INFUSION
10.0000 mg | Freq: Once | INTRAVENOUS | Status: AC
  Administered 2013-11-19: 10 mg via INTRAVENOUS
  Filled 2013-11-19: qty 10

## 2013-11-19 MED ORDER — PANTOPRAZOLE SODIUM 40 MG PO TBEC
40.0000 mg | DELAYED_RELEASE_TABLET | Freq: Every morning | ORAL | Status: DC
Start: 2013-11-19 — End: 2013-11-20

## 2013-11-19 MED ORDER — VANCOMYCIN HCL IN DEXTROSE 1-5 GM/200ML-% IV SOLN
1000.0000 mg | Freq: Once | INTRAVENOUS | Status: AC
  Administered 2013-11-19: 1000 mg via INTRAVENOUS
  Filled 2013-11-19: qty 200

## 2013-11-19 MED ORDER — SENNOSIDES-DOCUSATE SODIUM 8.6-50 MG PO TABS: 1.0000 | ORAL_TABLET | Freq: Every morning | ORAL | Status: DC

## 2013-11-19 MED ORDER — ACETAMINOPHEN 650 MG RE SUPP
650.0000 mg | Freq: Four times a day (QID) | RECTAL | Status: DC | PRN
  Administered 2013-11-22: 650 mg via RECTAL
  Filled 2013-11-19: qty 1

## 2013-11-19 MED ORDER — MIRTAZAPINE 15 MG PO TABS
30.0000 mg | ORAL_TABLET | Freq: Every day | ORAL | Status: DC
  Administered 2013-11-19: 30 mg via ORAL
  Filled 2013-11-19: qty 2

## 2013-11-19 MED ORDER — LEVALBUTEROL HCL 0.63 MG/3ML IN NEBU
0.6300 mg | INHALATION_SOLUTION | Freq: Four times a day (QID) | RESPIRATORY_TRACT | Status: DC
  Administered 2013-11-19 (×2): 0.63 mg via RESPIRATORY_TRACT
  Filled 2013-11-19 (×2): qty 3

## 2013-11-19 MED ORDER — SODIUM CHLORIDE 0.9 % IV BOLUS (SEPSIS)
1000.0000 mL | Freq: Once | INTRAVENOUS | Status: AC
  Administered 2013-11-19: 1000 mL via INTRAVENOUS

## 2013-11-19 MED ORDER — MAGNESIUM CITRATE PO SOLN: 1.0000 | Freq: Once | ORAL | Status: AC | PRN

## 2013-11-19 MED ORDER — SODIUM CHLORIDE 0.9 % IV BOLUS (SEPSIS)
30.0000 mL/kg | Freq: Once | INTRAVENOUS | Status: AC
  Administered 2013-11-19: 1320 mL via INTRAVENOUS

## 2013-11-19 MED ORDER — SODIUM CHLORIDE 0.9 % IV BOLUS (SEPSIS)
500.0000 mL | Freq: Once | INTRAVENOUS | Status: AC
  Administered 2013-11-19: 500 mL via INTRAVENOUS

## 2013-11-19 MED ORDER — HYDROCODONE-ACETAMINOPHEN 5-325 MG PO TABS
1.0000 | ORAL_TABLET | Freq: Four times a day (QID) | ORAL | Status: DC | PRN
  Administered 2013-11-19: 1 via ORAL
  Filled 2013-11-19: qty 1

## 2013-11-19 MED ORDER — METHYLPREDNISOLONE SODIUM SUCC 125 MG IJ SOLR
60.0000 mg | Freq: Once | INTRAMUSCULAR | Status: AC
  Administered 2013-11-19: 60 mg via INTRAVENOUS
  Filled 2013-11-19: qty 2

## 2013-11-19 MED ORDER — ALUM & MAG HYDROXIDE-SIMETH 200-200-20 MG/5ML PO SUSP: 30.0000 mL | Freq: Four times a day (QID) | ORAL | Status: DC | PRN

## 2013-11-19 MED ORDER — SODIUM CHLORIDE 0.9 % IJ SOLN
3.0000 mL | Freq: Two times a day (BID) | INTRAMUSCULAR | Status: DC
  Administered 2013-11-19: 3 mL via INTRAVENOUS

## 2013-11-19 MED ORDER — FLUTICASONE PROPIONATE HFA 44 MCG/ACT IN AERO
1.0000 | INHALATION_SPRAY | Freq: Two times a day (BID) | RESPIRATORY_TRACT | Status: DC
  Filled 2013-11-19: qty 10.6

## 2013-11-19 MED ORDER — DEXTROSE 5 % IV SOLN
2.0000 ug/min | INTRAVENOUS | Status: DC
  Administered 2013-11-19: 10 ug/min via INTRAVENOUS
  Filled 2013-11-19: qty 4

## 2013-11-19 MED ORDER — OCUVITE PO TABS
1.0000 | ORAL_TABLET | Freq: Every day | ORAL | Status: DC
  Filled 2013-11-19 (×2): qty 1

## 2013-11-19 MED ORDER — PIPERACILLIN-TAZOBACTAM 3.375 G IVPB
3.3750 g | Freq: Three times a day (TID) | INTRAVENOUS | Status: DC
  Administered 2013-11-19 – 2013-11-20 (×2): 3.375 g via INTRAVENOUS
  Filled 2013-11-19 (×3): qty 50

## 2013-11-19 MED ORDER — MAGNESIUM SULFATE 4000MG/100ML IJ SOLN
4.0000 g | Freq: Once | INTRAMUSCULAR | Status: AC
  Administered 2013-11-19: 4 g via INTRAVENOUS
  Filled 2013-11-19: qty 100

## 2013-11-19 MED ORDER — IPRATROPIUM BROMIDE 0.02 % IN SOLN: 0.5000 mg | Freq: Once | RESPIRATORY_TRACT | Status: DC

## 2013-11-19 MED ORDER — SODIUM CHLORIDE 0.9 % IV SOLN
INTRAVENOUS | Status: AC
  Administered 2013-11-20: via INTRAVENOUS

## 2013-11-19 MED ORDER — IPRATROPIUM BROMIDE 0.02 % IN SOLN: 0.5000 mg | RESPIRATORY_TRACT | Status: DC | PRN

## 2013-11-19 MED ORDER — MORPHINE SULFATE 2 MG/ML IJ SOLN
2.0000 mg | INTRAMUSCULAR | Status: DC | PRN
  Administered 2013-11-19: 2 mg via INTRAVENOUS
  Administered 2013-11-19: 4 mg via INTRAVENOUS
  Administered 2013-11-19: 2 mg via INTRAVENOUS
  Administered 2013-11-20: 4 mg via INTRAVENOUS
  Administered 2013-11-20: 2 mg via INTRAVENOUS
  Filled 2013-11-19: qty 2
  Filled 2013-11-19: qty 1
  Filled 2013-11-19 (×2): qty 2

## 2013-11-19 MED ORDER — VANCOMYCIN HCL 500 MG IV SOLR
500.0000 mg | INTRAVENOUS | Status: DC
  Filled 2013-11-19: qty 500

## 2013-11-19 MED ORDER — ACETAMINOPHEN 325 MG PO TABS
650.0000 mg | ORAL_TABLET | Freq: Three times a day (TID) | ORAL | Status: DC
  Administered 2013-11-19 (×2): 650 mg via ORAL
  Filled 2013-11-19 (×2): qty 2

## 2013-11-19 MED ORDER — VITAMIN D 1000 UNITS PO TABS: 5000.0000 [IU] | ORAL_TABLET | Freq: Every morning | ORAL | Status: DC

## 2013-11-19 MED ORDER — IPRATROPIUM BROMIDE 0.02 % IN SOLN
0.5000 mg | Freq: Four times a day (QID) | RESPIRATORY_TRACT | Status: DC
  Administered 2013-11-19 (×2): 0.5 mg via RESPIRATORY_TRACT
  Filled 2013-11-19 (×2): qty 2.5

## 2013-11-19 MED ORDER — SODIUM CHLORIDE 0.9 % IV SOLN
1000.0000 mL | INTRAVENOUS | Status: DC
  Administered 2013-11-19: 1000 mL via INTRAVENOUS

## 2013-11-19 MED ORDER — POLYETHYLENE GLYCOL 3350 17 G PO PACK: 17.0000 g | PACK | Freq: Every day | ORAL | Status: DC

## 2013-11-19 MED ORDER — CALCIUM CARBONATE 1250 (500 CA) MG PO TABS
1.0000 | ORAL_TABLET | Freq: Two times a day (BID) | ORAL | Status: DC
  Administered 2013-11-19: 500 mg via ORAL
  Filled 2013-11-19: qty 1

## 2013-11-19 MED ORDER — HYDROCORTISONE SOD SUCCINATE 100 MG IJ SOLR
50.0000 mg | Freq: Four times a day (QID) | INTRAMUSCULAR | Status: DC
  Administered 2013-11-19 – 2013-11-20 (×3): 50 mg via INTRAVENOUS
  Filled 2013-11-19 (×7): qty 1

## 2013-11-19 MED ORDER — LEVALBUTEROL HCL 0.63 MG/3ML IN NEBU
0.6300 mg | INHALATION_SOLUTION | RESPIRATORY_TRACT | Status: DC | PRN
Start: 2013-11-19 — End: 2013-11-20

## 2013-11-19 MED ORDER — IPRATROPIUM-ALBUTEROL 0.5-2.5 (3) MG/3ML IN SOLN
3.0000 mL | Freq: Four times a day (QID) | RESPIRATORY_TRACT | Status: DC
  Administered 2013-11-19: 3 mL via RESPIRATORY_TRACT
  Filled 2013-11-19: qty 3

## 2013-11-19 MED ORDER — SORBITOL 70 % SOLN
30.0000 mL | Freq: Every day | Status: DC | PRN
  Filled 2013-11-19: qty 30

## 2013-11-19 MED ORDER — INSULIN ASPART 100 UNIT/ML ~~LOC~~ SOLN
0.0000 [IU] | Freq: Three times a day (TID) | SUBCUTANEOUS | Status: DC
  Administered 2013-11-19: 2 [IU] via SUBCUTANEOUS
  Administered 2013-11-19: 5 [IU] via SUBCUTANEOUS

## 2013-11-19 MED ORDER — ACETAMINOPHEN 325 MG PO TABS: 650.0000 mg | ORAL_TABLET | Freq: Four times a day (QID) | ORAL | Status: DC | PRN

## 2013-11-19 MED ORDER — CALCIUM CARBONATE 600 MG PO TABS: 600.0000 mg | ORAL_TABLET | Freq: Two times a day (BID) | ORAL | Status: DC

## 2013-11-19 NOTE — Progress Notes (Signed)
PT Cancellation Note  Patient Details Name: Lori [name] N Jamerson MRN: [id number] DOB: 02/11/1921   Cancelled Treatment:    Reason Eval/Treat Not Completed: Medical issues which prohibited therapy. Pt still with inc HR and low BP, hold PT today, will check on pt tomorrow morning. Thank you.   Marella BileBRITT, Makaiyah Schweiger 01/23/14, 1:21 PM Marella BileSharron Berkeley Veldman, PT Pager: [phone] 01/23/14

## 2013-11-19 NOTE — Consult Note (Addendum)
CARDIOLOGY CONSULT NOTE     Patient ID: Lori [name] MRN: [id number] DOB/AGE: 78/06/1920 78 y.o.  Admit date: 08-29-13 Referring Physician Ramiro Harvestaniel Thompson MD Primary Physician Thayer HeadingsMACKENZIE,BRIAN, MD Primary Cardiologist [place]had Hilty MD Reason for Consultation afib with RVR  HPI: This is a 80 y.o. female with a past medical history significant for chronic atrial fibrillation. She had previously been followed by Dr. Aggie Cosier[place]. Last seen by Dr. Rennis GoldenHilty in 2013.  I can find no record of any coronary disease. She does have COPD and had lung cancer surgery in 1995. She is not on home oxygen. She has not been on Coumadin or ASA in the past because of GI bleeding and history of falls.  She was admitted from the nursing home today due to pain and swelling in her right arm. Diagnosed with sepsis due to cellulitis of the arm. HR has been elevated to 130s. Started on IV cardizem and this resulted in hypotension with BP drop into the 60s. Patient is demented and unable to give history. Echo in 2013 showed normal LV function with moderate pulmonary HTN.   Past Medical History  Diagnosis Date  . Diabetes mellitus   . Hypertension   . Atrial fibrillation     with RVR  . Hyperlipidemia   . Peripheral neuropathy   . Gastric AVM   . GERD (gastroesophageal reflux disease)   . Diabetic Charcot foot   . Shortness of breath   . COPD (chronic obstructive pulmonary disease)     excerbation  . Anemia   . Leukocytosis     Family History  Problem Relation Age of Onset  . Cancer Mother [id number]    melanoma  . Aneurysm Father     stomach aneurysm    History   Social History  . Marital Status: Widowed    Spouse Name: N/A    Number of Children: N/A  . Years of Education: N/A   Occupational History  . Not on file.   Social History Main Topics  . Smoking status: Former Smoker -- 0.50 packs/day for 10 years    Types: Cigarettes    Quit date: 04/28/2002  . Smokeless tobacco: Not on file  . Alcohol Use:  No  . Drug Use: No  . Sexual Activity: No   Other Topics Concern  . Not on file   Social History Narrative  . No narrative on file    Past Surgical History  Procedure Laterality Date  . Tonsillectomy    . Lung removal, partial    . Thyroidectomy, partial    . Hip arthroplasty Left 08/18/2012    Procedure: ARTHROPLASTY BIPOLAR HIP;  Surgeon: Eldred MangesMark C Yates, MD;  Location: [facility];  Service: Orthopedics;  Laterality: Left;  . Intramedullary (im) nail intertrochanteric Right 09/16/2013    Procedure: INTRAMEDULLARY (IM) NAIL INTERTROCHANTRIC;  Surgeon: Nadara MustardMarcus V Duda, MD;  Location: [facility];  Service: Orthopedics;  Laterality: Right;     Prescriptions prior to admission  Medication Sig Dispense Refill  . acetaminophen (TYLENOL) 325 MG tablet Take 650 mg by mouth 3 (three) times daily.      . beta carotene w/minerals (OCUVITE) tablet Take 1 tablet by mouth daily.      . budesonide (PULMICORT) 180 MCG/ACT inhaler Inhale 1 puff into the lungs 2 (two) times daily.      . calcium carbonate (OS-CAL) 600 MG TABS Take 600 mg by mouth 2 (two) times daily with a meal.      . Cholecalciferol (  VITAMIN D-3) 5000 UNITS TABS Take 1 tablet by mouth every morning.       . furosemide (LASIX) 20 MG tablet Take 20 mg by mouth daily.      Marland Kitchen guaiFENesin (MUCINEX) 600 MG 12 hr tablet Take 1 tablet (600 mg total) by mouth 2 (two) times daily.  60 tablet  0  . HYDROcodone-acetaminophen (NORCO/VICODIN) 5-325 MG per tablet Take 1 tablet by mouth every 6 (six) hours as needed for moderate pain or severe pain.      Marland Kitchen insulin lispro (HUMALOG) 100 UNIT/ML injection Inject 0-9 Units into the skin 3 (three) times daily before meals. Per sliding scale      . mirtazapine (REMERON) 30 MG tablet Take 30 mg by mouth at bedtime.      . pantoprazole (PROTONIX) 40 MG tablet Take 40 mg by mouth every morning.      . polyethylene glycol powder (GLYCOLAX/MIRALAX) powder Take 17 g by mouth every morning.      . potassium chloride (K-DUR)  10 MEQ tablet Take 10 mEq by mouth daily.      . sennosides-docusate sodium (SENOKOT-S) 8.6-50 MG tablet Take 1 tablet by mouth every morning.      Marland Kitchen acetaminophen (TYLENOL) 500 MG tablet Take 1 tablet (500 mg total) by mouth every 6 (six) hours as needed for mild pain.  30 tablet  0  . ondansetron (ZOFRAN) 4 MG tablet Take 1 tablet (4 mg total) by mouth every 6 (six) hours as needed for nausea.  20 tablet  0  . zolpidem (AMBIEN) 5 MG tablet Take 5 mg by mouth at bedtime as needed for sleep.         ROS: As noted in HPI. All other systems are reviewed and are negative unless otherwise mentioned.   Physical Exam: Blood pressure 90/41, pulse 53, temperature 98.1 F (36.7 C), temperature source Oral, resp. rate 32, height 5\' 1"  (1.549 m), weight 101 lb 10.1 oz (46.1 kg), last menstrual period 07/29/1960, SpO2 97.00%. Current Weight  11/16/2013 101 lb 10.1 oz (46.1 kg)  10/12/13 97 lb (43.999 kg)  09/16/13 108 lb 11 oz (49.3 kg)    Elderly and chronically ill appearing WF  HEENT: PERRLA, eyelids swollen. Dry mucus membranes. [place]/AT Neck: no JVD, no bruits. Lungs: clear CV: IRR, normal S1-2, no gallop or murmur Abd: soft, NT. BS +, no masses or HSM Ext: extensive swelling and ecchymosis of entire right arm with erythema and blistering with oozing. No edema in legs. Feet warm. Neuro: demented. Rambling speech.  Labs:   Lab Results  Component Value Date   WBC 14.1* 11/16/2013   HGB 11.5* 10/31/2013   HCT 35.9* 11/16/2013   MCV 90.2 11/09/2013   PLT 281 11/10/2013    Recent Labs Lab 11/25/2013 0643 11/18/2013 0834  NA 134* 136*  K 5.6* 4.9  CL 99 104  CO2 21 21  BUN 38* 35*  CREATININE 1.16* 1.09  CALCIUM 9.9 8.8  PROT 8.3  --   BILITOT 0.4  --   ALKPHOS 129*  --   ALT 14  --   AST 33  --   GLUCOSE 320* 303*    Lab Results  Component Value Date   TSH 1.890 11/27/2013   Lab Results  Component Value Date   HGBA1C 6.1* 11/10/2013   Lactic acid: 4.91  Magnesium  1.2  Radiology: Dg Chest 1 View  11/25/2013   CLINICAL DATA:  Possible fall; right arm pain.  EXAM: RIGHT  HUMERUS - 2+ VIEW  CHEST - 1 VIEW  COMPARISON:  Right humerus radiographs performed 11/14/2012, and chest radiograph performed 11/11/2013  FINDINGS: The right humerus appears grossly intact. There is chronic deformity of the proximal right humerus. There is no evidence of acute fracture or dislocation. The elbow joint is grossly unremarkable in appearance. No elbow joint effusion is identified. Prominent soft tissue swelling is noted along the distal right arm and overlying the olecranon.  The right humeral head remains seated at the glenoid fossa.  Chronic right basilar scarring is grossly unchanged, with mild chronically increased interstitial markings. No pleural effusion or pneumothorax is seen. The cardiomediastinal silhouette is normal in size. New right paratracheal and right upper lung zone airspace opacity may reflect atelectasis or pneumonia.  Mild degenerative change is noted at the left glenohumeral joint.  IMPRESSION: 1. Chronic deformity of the proximal right humerus; no evidence of acute fracture or dislocation. Prominent soft tissue swelling along the distal right arm and overlying the olecranon. 2. Chronic lung changes again noted. New right paratracheal and right upper lung zone airspace opacity may reflect atelectasis or pneumonia. Would correlate for associated symptoms.   Electronically Signed   By: Roanna RaiderJeffery  Chang M.D.   On: 11/01/2013 07:20   Dg Shoulder Right  11/24/2013   CLINICAL DATA:  Possible fall; right shoulder pain.  EXAM: RIGHT SHOULDER - 2+ VIEW  COMPARISON:  Right shoulder radiographs performed 11/14/2012  FINDINGS: There is chronic deformity of the proximal right humerus. There is no evidence of acute fracture or dislocation. The right humeral head remains seated at the glenoid fossa. Surrounding osteophyte formation is noted. The right acromioclavicular joint  demonstrates mild degenerative change, but is otherwise unremarkable.  No significant soft tissue abnormalities are characterized on radiograph. Chronic right-sided lung changes are again noted.  IMPRESSION: Chronic deformity of the proximal right humerus; no evidence of acute fracture or dislocation.   Electronically Signed   By: Roanna RaiderJeffery  Chang M.D.   On: 11/14/2013 07:10   Dg Elbow Complete Right  11/01/2013   CLINICAL DATA:  Possible fall. Right elbow pain and soft tissue swelling.  EXAM: RIGHT ELBOW - COMPLETE 3+ VIEW  COMPARISON:  Right elbow radiographs performed 11/14/2012  FINDINGS: There is no evidence of fracture or dislocation. The visualized joint spaces are preserved. No significant joint effusion is identified. Dorsal soft tissue swelling is noted overlying the olecranon.  IMPRESSION: No evidence of fracture or dislocation.   Electronically Signed   By: Roanna RaiderJeffery  Chang M.D.   On: 11/20/2013 07:08   Dg Humerus Right  11/16/2013   CLINICAL DATA:  Possible fall; right arm pain.  EXAM: RIGHT HUMERUS - 2+ VIEW  CHEST - 1 VIEW  COMPARISON:  Right humerus radiographs performed 11/14/2012, and chest radiograph performed 11/11/2013  FINDINGS: The right humerus appears grossly intact. There is chronic deformity of the proximal right humerus. There is no evidence of acute fracture or dislocation. The elbow joint is grossly unremarkable in appearance. No elbow joint effusion is identified. Prominent soft tissue swelling is noted along the distal right arm and overlying the olecranon.  The right humeral head remains seated at the glenoid fossa.  Chronic right basilar scarring is grossly unchanged, with mild chronically increased interstitial markings. No pleural effusion or pneumothorax is seen. The cardiomediastinal silhouette is normal in size. New right paratracheal and right upper lung zone airspace opacity may reflect atelectasis or pneumonia.  Mild degenerative change is noted at the left glenohumeral  joint.  IMPRESSION: 1. Chronic deformity of the proximal right humerus; no evidence of acute fracture or dislocation. Prominent soft tissue swelling along the distal right arm and overlying the olecranon. 2. Chronic lung changes again noted. New right paratracheal and right upper lung zone airspace opacity may reflect atelectasis or pneumonia. Would correlate for associated symptoms.   Electronically Signed   By: Roanna Raider M.D.   On: 11/09/2013 07:20    EKG: Afib with RVR, nonspecific TWA. Poor R wave progression-chronic.  ASSESSMENT AND PLAN:  1. Atrial fibrillation- chronic- with RVR due to sepsis and dehydration. Elevated HR may be appropriate for her current stressors. Treatment of rate with cardizem or metoprolol will likely only worsen her condition with hypotension. Can digitalize at least in the short term. The best treatment of her HR is control of her underlying sepsis and correction of her volume status. She is not a candidate for cardioversion given long history of Afib. HR previously was controlled on no medication. She is better off with a high HR and normal BP.   2. Severe sepsis  3. Cellulitis of right arm  4. Dementia.  5. Hypomagnesemia  Plan: volume bolus and hydration.  DC IV cardizem. Avoid medications that lower BP Digitalize IV. IV antibiotics.  Could consider IV amiodarone but I don't think HR is the central problem here.   Signed: Peter [place], MDFACC  11/13/2013, 4:03 PM

## 2013-11-19 NOTE — ED Notes (Signed)
Pt is oriented to self and place only.   Does not remember what happened to her right arm.  Right arm is warm, ecchymotic and swollen.  Pt is A. Fib on the monitor.

## 2013-11-19 NOTE — ED Provider Notes (Signed)
Pt seen and evaluated.  No reported history of fall or injury.  Son and daughter-in-law are here now.  No reported injury to their knowledge.  Exam shows erythema and eccymosis adjacent to skin tear/wound to distal right upper arm. Temp 100.4.  X-ray prelim without fracture.  Care d/w PA Johnnette Gourdobyn Albert.  Agree with asessment and plan of cellulitis, IV antibiotics, and admission.  Rolland PorterMark James, MD 09-15-13 [phone]

## 2013-11-19 NOTE — H&P (Signed)
[facility] History and Physical  Lori [name] XKG:[id number] DOB: 10-27-1920 DOA: 10/30/2013  Referring physician: Dr. Jeneen Rinks PCP: Thressa Sheller, MD   Chief Complaint: Right arm pain  HPI: Lori [name] is a 80 y.o. female  [facility] per family wheelchair-bound, With history of underlying dementia, atrial fibrillation no longer an anticoagulation candidate secondary to history of frequent falls, chronic kidney disease stage III, gastroesophageal reflux disease, diabetes mellitus, hypertension, COPD, anemia who presents to the ED from nursing facility with right arm pain and swelling was noticed around 5 AM on the morning of admission. Patient and family deny any recent trauma. Per son right upper extremity looked okay the night prior to admission. No known fevers, no known chills, no known chest pain, no known shortness of breath, no cough, no dysuria, no abdominal pain, no nausea, no vomiting, no diarrhea, no constipation, no weakness. Per family patient has had decreased oral intake and has been wheelchair-bound over the past 2 months. Patient was noted to be oriented to person and place only which her family is her baseline.  On presentation to the emergency room patient noted to be febrile with a temp of 100.3, heart rate in the 120s to the 130s, respiratory rate in the 20s. CBC had a white count of 14.1 hemoglobin of 11.5 otherwise was within normal limits. Comprehensive metabolic profile the sodium of 134 potassium of 5.6 BUN of 30 a creatinine of 1.16 phosphatase of 129 otherwise was within normal limits. Lactic acid was elevated at 4.91.  Plain films of the right shoulder, elbow and right humerus are negative for any acute fractures. Chest x-ray showed chronic lung changes noted again with a new right paratracheal and right upper lung zone airspace opacity which may reflect atelectasis or pneumonia. Urinalysis which was done was nitrite negative  leukocytes negative. Blood cultures were drawn and are pending. Urine cultures are pending. Patient was given IV vancomycin IV Zosyn. We were called to admit the patient for further evaluation and management.    Review of Systems: As per history of present illness otherwise negative. Constitutional:  No weight loss, night sweats, Fevers, chills, fatigue.  HEENT:  No headaches, Difficulty swallowing,Tooth/dental problems,Sore throat,  No sneezing, itching, ear ache, nasal congestion, post nasal drip,  Cardio-vascular:  No chest pain, Orthopnea, PND, swelling in lower extremities, anasarca, dizziness, palpitations  GI:  No heartburn, indigestion, abdominal pain, nausea, vomiting, diarrhea, change in bowel habits, loss of appetite  Resp:  No shortness of breath with exertion or at rest. No excess mucus, no productive cough, No non-productive cough, No coughing up of blood.No change in color of mucus.No wheezing.No chest wall deformity  Skin:  no rash or lesions.  GU:  no dysuria, change in color of urine, no urgency or frequency. No flank pain.  Musculoskeletal:  No joint pain or swelling. No decreased range of motion. No back pain.  Psych:  No change in mood or affect. No depression or anxiety. No memory loss.   Past Medical History  Diagnosis Date  . Diabetes mellitus   . Hypertension   . Atrial fibrillation     with RVR  . Hyperlipidemia   . Peripheral neuropathy   . Gastric AVM   . GERD (gastroesophageal reflux disease)   . Diabetic Charcot foot   . Shortness of breath   . COPD (chronic obstructive pulmonary disease)     excerbation  . Anemia   . Leukocytosis    Past  Surgical History  Procedure Laterality Date  . Tonsillectomy    . Lung removal, partial    . Thyroidectomy, partial    . Hip arthroplasty Left 08/18/2012    Procedure: ARTHROPLASTY BIPOLAR HIP;  Surgeon: Marybelle Killings, MD;  Location: [facility];  Service: Orthopedics;  Laterality: Left;  . Intramedullary (im)  nail intertrochanteric Right 09/16/2013    Procedure: INTRAMEDULLARY (IM) NAIL INTERTROCHANTRIC;  Surgeon: Newt Minion, MD;  Location: [place];  Service: Orthopedics;  Laterality: Right;   Social History:  reports that she quit smoking about 11 years ago. Her smoking use included Cigarettes. She has a 5 pack-year smoking history. She does not have any smokeless tobacco history on file. She reports that she does not drink alcohol or use illicit drugs.  Allergies  Allergen Reactions  . Aspirin Other (See Comments)    Unknown   . Indapamide Other (See Comments)    Unknown   . Metformin And Related Other (See Comments)    Unknown   . Penicillins Other (See Comments)    Unknown   . Sulfa Antibiotics Other (See Comments)    Unknown   . Tiotropium Bromide Monohydrate Other (See Comments)    Unknown     Family History  Problem Relation Age of Onset  . Cancer Mother 97    melanoma  . Aneurysm Father     stomach aneurysm     Prior to Admission medications   Medication Sig Start Date End Date Taking? Authorizing Provider  acetaminophen (TYLENOL) 325 MG tablet Take 650 mg by mouth 3 (three) times daily.   Yes Historical Provider, MD  beta carotene w/minerals (OCUVITE) tablet Take 1 tablet by mouth daily.   Yes Historical Provider, MD  budesonide (PULMICORT) 180 MCG/ACT inhaler Inhale 1 puff into the lungs 2 (two) times daily.   Yes Historical Provider, MD  calcium carbonate (OS-CAL) 600 MG TABS Take 600 mg by mouth 2 (two) times daily with a meal.   Yes Historical Provider, MD  Cholecalciferol (VITAMIN D-3) 5000 UNITS TABS Take 1 tablet by mouth every morning.    Yes Historical Provider, MD  furosemide (LASIX) 20 MG tablet Take 20 mg by mouth daily.   Yes Historical Provider, MD  guaiFENesin (MUCINEX) 600 MG 12 hr tablet Take 1 tablet (600 mg total) by mouth 2 (two) times daily. 08/22/12  Yes Theodis Blaze, MD  HYDROcodone-acetaminophen (NORCO/VICODIN) 5-325 MG per tablet Take 1 tablet  by mouth every 6 (six) hours as needed for moderate pain or severe pain.   Yes Historical Provider, MD  insulin lispro (HUMALOG) 100 UNIT/ML injection Inject 0-9 Units into the skin 3 (three) times daily before meals. Per sliding scale   Yes Historical Provider, MD  mirtazapine (REMERON) 30 MG tablet Take 30 mg by mouth at bedtime.   Yes Historical Provider, MD  pantoprazole (PROTONIX) 40 MG tablet Take 40 mg by mouth every morning. 08/06/11 08/06/14 Yes Sosan Fuller [place], MD  polyethylene glycol powder (GLYCOLAX/MIRALAX) powder Take 17 g by mouth every morning.   Yes Historical Provider, MD  potassium chloride (K-DUR) 10 MEQ tablet Take 10 mEq by mouth daily. 08/06/11 05/27/14 Yes Sosan Fuller [place], MD  sennosides-docusate sodium (SENOKOT-S) 8.6-50 MG tablet Take 1 tablet by mouth every morning.   Yes Historical Provider, MD  acetaminophen (TYLENOL) 500 MG tablet Take 1 tablet (500 mg total) by mouth every 6 (six) hours as needed for mild pain. 09/16/13   Newt Minion, MD  ondansetron (  ZOFRAN) 4 MG tablet Take 1 tablet (4 mg total) by mouth every 6 (six) hours as needed for nausea. 09/19/13   Delfina Redwood, MD  zolpidem (AMBIEN) 5 MG tablet Take 5 mg by mouth at bedtime as needed for sleep.    Historical Provider, MD   Physical Exam: Filed Vitals:   11/25/2013 0732  BP: 135/83  Pulse: 125  Temp:   Resp: 26    BP 135/83  Pulse 125  Temp(Src) 100.3 F (37.9 C) (Rectal)  Resp 26  Ht 5' 2.99" (1.6 m)  Wt 44 kg (97 lb)  BMI 17.19 kg/m2  SpO2 93%  LMP 07/29/1960  General: Frail cachectic female laying on a gurney in no acute cardiopulmonary distress. Eyes: PERRLA, EOMI, normal lids, irises & conjunctiva ENT: grossly normal hearing, lips & tongue. Dry mucous membranes.  Neck: no LAD, masses or thyromegaly Cardiovascular:  irregularly irregular. No LE edema. Telemetry:  atrial fibrillation with RVR heart rate 130s  Respiratory:  minimal to mild expiratory wheezing otherwise clear. No rhonchi.  No crackles.  Normal respiratory effort. Abdomen: soft, ntnd, positive bowel sounds, no rebound, no guarding  Skin: right upper extremity ecchymotic, with some induration, warmth, erythema, tender to palpation.  Musculoskeletal: right upper extremity ecchymotic, with some induration, warmth, erythema, tender to palpation.  Psychiatric: grossly normal mood and affect, speech fluent and appropriate Neurologic:  alert and oriented to self and place only. Moving extremities spontaneously. Cranial nerves II through XII are grossly intact. No focal deficits. Gait not tested secondary to safety.           Labs on Admission:  Basic Metabolic Panel:  Recent Labs Lab 11/06/2013 0643 11/04/2013 0834  NA 134* 136*  K 5.6* 4.9  CL 99 104  CO2 21 21  GLUCOSE 320* 303*  BUN 38* 35*  CREATININE 1.16* 1.09  CALCIUM 9.9 8.8   Liver Function Tests:  Recent Labs Lab 11/13/2013 0643  AST 33  ALT 14  ALKPHOS 129*  BILITOT 0.4  PROT 8.3  ALBUMIN 3.0*   No results found for this basename: LIPASE, AMYLASE,  in the last 168 hours No results found for this basename: AMMONIA,  in the last 168 hours CBC:  Recent Labs Lab 11/25/2013 0643  WBC 14.1*  NEUTROABS 12.8*  HGB 11.5*  HCT 35.9*  MCV 90.2  PLT 281   Cardiac Enzymes: No results found for this basename: CKTOTAL, CKMB, CKMBINDEX, TROPONINI,  in the last 168 hours  BNP (last 3 results)  Recent Labs  05/28/13 0527 05/30/13 0350  PROBNP 4043.0* 3363.0*   CBG: No results found for this basename: GLUCAP,  in the last 168 hours  Radiological Exams on Admission: Dg Chest 1 View  11/05/2013   CLINICAL DATA:  Possible fall; right arm pain.  EXAM: RIGHT HUMERUS - 2+ VIEW  CHEST - 1 VIEW  COMPARISON:  Right humerus radiographs performed 11/14/2012, and chest radiograph performed 11/11/2013  FINDINGS: The right humerus appears grossly intact. There is chronic deformity of the proximal right humerus. There is no evidence of acute fracture or  dislocation. The elbow joint is grossly unremarkable in appearance. No elbow joint effusion is identified. Prominent soft tissue swelling is noted along the distal right arm and overlying the olecranon.  The right humeral head remains seated at the glenoid fossa.  Chronic right basilar scarring is grossly unchanged, with mild chronically increased interstitial markings. No pleural effusion or pneumothorax is seen. The cardiomediastinal silhouette is normal in size. New  right paratracheal and right upper lung zone airspace opacity may reflect atelectasis or pneumonia.  Mild degenerative change is noted at the left glenohumeral joint.  IMPRESSION: 1. Chronic deformity of the proximal right humerus; no evidence of acute fracture or dislocation. Prominent soft tissue swelling along the distal right arm and overlying the olecranon. 2. Chronic lung changes again noted. New right paratracheal and right upper lung zone airspace opacity may reflect atelectasis or pneumonia. Would correlate for associated symptoms.   Electronically Signed   By: Garald Balding M.D.   On: 11/01/2013 07:20   Dg Shoulder Right  11/12/2013   CLINICAL DATA:  Possible fall; right shoulder pain.  EXAM: RIGHT SHOULDER - 2+ VIEW  COMPARISON:  Right shoulder radiographs performed 11/14/2012  FINDINGS: There is chronic deformity of the proximal right humerus. There is no evidence of acute fracture or dislocation. The right humeral head remains seated at the glenoid fossa. Surrounding osteophyte formation is noted. The right acromioclavicular joint demonstrates mild degenerative change, but is otherwise unremarkable.  No significant soft tissue abnormalities are characterized on radiograph. Chronic right-sided lung changes are again noted.  IMPRESSION: Chronic deformity of the proximal right humerus; no evidence of acute fracture or dislocation.   Electronically Signed   By: Garald Balding M.D.   On: 11/01/2013 07:10   Dg Elbow Complete  Right  11/18/2013   CLINICAL DATA:  Possible fall. Right elbow pain and soft tissue swelling.  EXAM: RIGHT ELBOW - COMPLETE 3+ VIEW  COMPARISON:  Right elbow radiographs performed 11/14/2012  FINDINGS: There is no evidence of fracture or dislocation. The visualized joint spaces are preserved. No significant joint effusion is identified. Dorsal soft tissue swelling is noted overlying the olecranon.  IMPRESSION: No evidence of fracture or dislocation.   Electronically Signed   By: Garald Balding M.D.   On: 11/11/2013 07:08   Dg Humerus Right  11/15/2013   CLINICAL DATA:  Possible fall; right arm pain.  EXAM: RIGHT HUMERUS - 2+ VIEW  CHEST - 1 VIEW  COMPARISON:  Right humerus radiographs performed 11/14/2012, and chest radiograph performed 11/11/2013  FINDINGS: The right humerus appears grossly intact. There is chronic deformity of the proximal right humerus. There is no evidence of acute fracture or dislocation. The elbow joint is grossly unremarkable in appearance. No elbow joint effusion is identified. Prominent soft tissue swelling is noted along the distal right arm and overlying the olecranon.  The right humeral head remains seated at the glenoid fossa.  Chronic right basilar scarring is grossly unchanged, with mild chronically increased interstitial markings. No pleural effusion or pneumothorax is seen. The cardiomediastinal silhouette is normal in size. New right paratracheal and right upper lung zone airspace opacity may reflect atelectasis or pneumonia.  Mild degenerative change is noted at the left glenohumeral joint.  IMPRESSION: 1. Chronic deformity of the proximal right humerus; no evidence of acute fracture or dislocation. Prominent soft tissue swelling along the distal right arm and overlying the olecranon. 2. Chronic lung changes again noted. New right paratracheal and right upper lung zone airspace opacity may reflect atelectasis or pneumonia. Would correlate for associated symptoms.    Electronically Signed   By: Garald Balding M.D.   On: 11/26/2013 07:20    EKG: Atrial fibrillation with RVR heart rate 128.   Assessment/Plan Principal Problem:   Sepsis due to cellulitis Active Problems:   Atrial fibrillation with RVR   Cellulitis of arm, right   HTN (hypertension)   GERD (gastroesophageal reflux disease)  Leukocytosis   Anemia   Dementia without behavioral disturbance   DM type 2, uncontrolled, with renal complications   Chronic diastolic heart failure   CKD (chronic kidney disease) stage 3, GFR 30-59 ml/min   COPD (chronic obstructive pulmonary disease)   Depression   Hyperkalemia   Sepsis   #1 sepsis probably due to cellulitis patient presented to the ED with temp of 100.3, tachycardic, tachypneic, with a white count of 14.1 with a noted ecchymotic right upper extremity with cellulitis. Lactic acid is elevated. Urinalysis is negative. Urine cultures pending. Chest x-ray with chronic lung changes a new right paratracheal and right upper lungs on airspace opacity which may reflect atelectasis or pneumonia. Patient with no respiratory symptoms except some minimal to mild respiratory wheezing. No cough noted. Doubt if a pneumonia. Blood cultures are also pending. Check a pro calcitonin level. Repeat lactic acid level in the morning. Treat empirically with IV vancomycin and IV Zosyn. Follow.  #2 A. fib with RVR Likely secondary to problem #1. Patient denies any chest pain or shortness of breath. We'll cycle cardiac enzymes every 6 hours x3. Check a TSH. Blood cultures, urine cultures are pending. Continue Cardizem drip. Continue treatment with empiric IV antibiotics of vancomycin and Zosyn as in problem #1. Patient was deemed no longer an anticoagulation candidate secondary to recurrent falls and dementia and a such Coumadin was discontinued in the past. Follow.  #3 right upper extremity cellulitis/ecchymosis Per  EDPA wound cultures were obtained. Blood cultures have  been ordered and are pending. Continue empiric IV vancomycin IV Zosyn. Warm compresses. Follow.  #4 leukocytosis Likely secondary to problem #1. Patient has been pancultured. Continue empiric IV vancomycin and IV Zosyn.  #5 type 2 diabetes Check a hemoglobin A1c. Place on a sliding scale insulin.  #6 chronic kidney disease stage III Stable. Follow.  #7 COPD Patient with minimal to mild expiratory wheezing. Will give a one-time dose of IV Solu-Medrol 60 mg x1. Will place on scheduled nebulizer treatments. Continue home inhalers. Follow.  #8 hyperkalemia Repeat a be met. If potassium is still elevated will give Kayexalate.  #9 anemia H&H stable. Follow.  #10 gastroesophageal reflux disease PPI.  #11 hypertension Stable. Currently on Cardizem drip. Follow.  #12 prophylaxis PPI for GI prophylaxis. SCD for DVT prophylaxis.   Code Status: DO NOT RESUSCITATE Family Communication: Updated patient and sons at bedside. Disposition Plan: Admit to step down unit.  Time spent: [place] MD [facility] Pager [phone]  **Disclaimer: This note may have been dictated with voice recognition software. Similar sounding words can inadvertently be transcribed and this note may contain transcription errors which may not have been corrected upon publication of note.**

## 2013-11-19 NOTE — Progress Notes (Signed)
ANTIBIOTIC CONSULT NOTE - INITIAL  Pharmacy Consult for Zosyn/Vancomycin  Indication: Cellulitis/r/o Sepsis  Allergies  Allergen Reactions  . Aspirin Other (See Comments)    Unknown   . Indapamide Other (See Comments)    Unknown   . Metformin And Related Other (See Comments)    Unknown   . Penicillins Other (See Comments)    Unknown   . Sulfa Antibiotics Other (See Comments)    Unknown   . Tiotropium Bromide Monohydrate Other (See Comments)    Unknown     Patient Measurements: Height: 5' 2.99" (160 cm) Weight: 97 lb (44 kg) IBW/kg (Calculated) : 52.38   Vital Signs: Temp: 100.3 F (37.9 C) (06/21 0622) Temp src: Rectal (06/21 0622) BP: 135/83 mmHg (06/21 0732) Pulse Rate: 125 (06/21 0732) Intake/Output from previous day:   Intake/Output from this shift:    Labs:  Recent Labs  2013-10-12 0643  WBC 14.1*  HGB 11.5*  PLT 281  CREATININE 1.16*   Estimated Creatinine Clearance: 21.9 ml/min (by C-G formula based on Cr of 1.16). No results found for this basename: VANCOTROUGH, VANCOPEAK, VANCORANDOM, GENTTROUGH, GENTPEAK, GENTRANDOM, TOBRATROUGH, TOBRAPEAK, TOBRARND, AMIKACINPEAK, AMIKACINTROU, AMIKACIN,  in the last 72 hours   Microbiology: No results found for this or any previous visit (from the past 720 hour(s)).  Medical History: Past Medical History  Diagnosis Date  . Diabetes mellitus   . Hypertension   . Atrial fibrillation     with RVR  . Hyperlipidemia   . Peripheral neuropathy   . Gastric AVM   . GERD (gastroesophageal reflux disease)   . Diabetic Charcot foot   . Shortness of breath   . COPD (chronic obstructive pulmonary disease)     excerbation  . Anemia   . Leukocytosis     Medications:  Scheduled:   Infusions:  . sodium chloride    . vancomycin     Assessment: 80 yo with a past medical history of diabetes, hypertension, atrial fibrillation, gastric AVM, GERD, COPD and anemia [facility] arm pain and swelling at the nursing facility noticed at 5:00 this morning, about one hour prior to arrival. No known injury or trauma. Patient denies any fall.  Zosyn and Vancomycin per Rx for cellulitis and probable sepsis.    Goal of Therapy:  Vancomycin trough level 15-20 mcg/ml  Plan:   Zosyn 3.375 Gm IV q8h EI infusion  Vancomycin 500mg  IV q24h  F/u SCr/levels/cultures as needed  Susanne GreenhouseGreen, Elizabeth R May 07, 2014,7:53 AM

## 2013-11-19 NOTE — ED Provider Notes (Signed)
CSN: [id number]     Arrival date & time 11/09/2013  0555 History   First MD Initiated Contact with Patient 11/25/2013 [phone]     Chief Complaint  Patient presents with  . Arm Pain     (Consider location/radiation/quality/duration/timing/severity/associated sxs/prior Treatment) HPI Comments: 80 year old female with a past medical history of diabetes, hypertension, atrial fibrillation, gastric AVM, GERD, COPD and anemia presents to the emergency department via EMS from [facility] with right arm pain and swelling at the nursing facility noticed at 5:00 this morning, about one hour prior to arrival. No known injury or trauma. Patient denies any fall. Patient is oriented to person and place only, unknown baseline mentation. EMS noted patient to be tachycardic, however no other symptoms reported by nursing facility. Level V caveat due to altered mental status.  Patient is a 80 y.o. female presenting with arm pain. The history is provided by the EMS personnel.  Arm Pain    Past Medical History  Diagnosis Date  . Diabetes mellitus   . Hypertension   . Atrial fibrillation     with RVR  . Hyperlipidemia   . Peripheral neuropathy   . Gastric AVM   . GERD (gastroesophageal reflux disease)   . Diabetic Charcot foot   . Shortness of breath   . COPD (chronic obstructive pulmonary disease)     excerbation  . Anemia   . Leukocytosis    Past Surgical History  Procedure Laterality Date  . Tonsillectomy    . Lung removal, partial    . Thyroidectomy, partial    . Hip arthroplasty Left 08/18/2012    Procedure: ARTHROPLASTY BIPOLAR HIP;  Surgeon: Eldred Manges, MD;  Location: [facility] ORS;  Service: Orthopedics;  Laterality: Left;  . Intramedullary (im) nail intertrochanteric Right 09/16/2013    Procedure: INTRAMEDULLARY (IM) NAIL INTERTROCHANTRIC;  Surgeon: Nadara Mustard, MD;  Location: [facility] OR;  Service: Orthopedics;  Laterality: Right;   Family History  Problem Relation Age of Onset  . Cancer Mother 15     melanoma  . Aneurysm Father     stomach aneurysm   History  Substance Use Topics  . Smoking status: Former Smoker -- 0.50 packs/day for 10 years    Types: Cigarettes    Quit date: 04/28/2002  . Smokeless tobacco: Not on file  . Alcohol Use: No   OB History   Grav Para Term Preterm Abortions TAB SAB Ect Mult Living                 Review of Systems  Unable to perform ROS: Mental status change      Allergies  Aspirin; Indapamide; Metformin and related; Penicillins; Sulfa antibiotics; and Tiotropium bromide monohydrate  Home Medications   Prior to Admission medications   Medication Sig Start Date End Date Taking? Authorizing Provider  acetaminophen (TYLENOL) 325 MG tablet Take 650 mg by mouth 3 (three) times daily.   Yes Historical Provider, MD  beta carotene w/minerals (OCUVITE) tablet Take 1 tablet by mouth daily.   Yes Historical Provider, MD  budesonide (PULMICORT) 180 MCG/ACT inhaler Inhale 1 puff into the lungs 2 (two) times daily.   Yes Historical Provider, MD  calcium carbonate (OS-CAL) 600 MG TABS Take 600 mg by mouth 2 (two) times daily with a meal.   Yes Historical Provider, MD  Cholecalciferol (VITAMIN D-3) 5000 UNITS TABS Take 1 tablet by mouth every morning.    Yes Historical Provider, MD  furosemide (LASIX) 20 MG tablet Take 20 mg  by mouth daily.   Yes Historical Provider, MD  guaiFENesin (MUCINEX) 600 MG 12 hr tablet Take 1 tablet (600 mg total) by mouth 2 (two) times daily. 08/22/12  Yes Dorothea OgleIskra M Myers, MD  HYDROcodone-acetaminophen (NORCO/VICODIN) 5-325 MG per tablet Take 1 tablet by mouth every 6 (six) hours as needed for moderate pain or severe pain.   Yes Historical Provider, MD  insulin lispro (HUMALOG) 100 UNIT/ML injection Inject 0-9 Units into the skin 3 (three) times daily before meals. Per sliding scale   Yes Historical Provider, MD  mirtazapine (REMERON) 30 MG tablet Take 30 mg by mouth at bedtime.   Yes Historical Provider, MD  pantoprazole  (PROTONIX) 40 MG tablet Take 40 mg by mouth every morning. 08/06/11 08/06/14 Yes Sosan Forrestine HimJ Abdullah, MD  polyethylene glycol powder (GLYCOLAX/MIRALAX) powder Take 17 g by mouth every morning.   Yes Historical Provider, MD  potassium chloride (K-DUR) 10 MEQ tablet Take 10 mEq by mouth daily. 08/06/11 05/27/14 Yes Sosan Forrestine HimJ Abdullah, MD  sennosides-docusate sodium (SENOKOT-S) 8.6-50 MG tablet Take 1 tablet by mouth every morning.   Yes Historical Provider, MD  acetaminophen (TYLENOL) 500 MG tablet Take 1 tablet (500 mg total) by mouth every 6 (six) hours as needed for mild pain. 09/16/13   Nadara MustardMarcus Duda V, MD  ondansetron (ZOFRAN) 4 MG tablet Take 1 tablet (4 mg total) by mouth every 6 (six) hours as needed for nausea. 09/19/13   Christiane Haorinna L Sullivan, MD  zolpidem (AMBIEN) 5 MG tablet Take 5 mg by mouth at bedtime as needed for sleep.    Historical Provider, MD   BP 138/73  Pulse 121  Temp(Src) 100.3 F (37.9 C) (Rectal)  Resp 18  Ht 5' 2.99" (1.6 m)  Wt 97 lb (44 kg)  BMI 17.19 kg/m2  SpO2 94%  LMP 07/29/1960 Physical Exam  Nursing note and vitals reviewed. Constitutional: She appears ill. No distress.  HENT:  Head: Normocephalic and atraumatic.  Nose: Nose normal.  Dry MM.  Eyes: Conjunctivae and EOM are normal. Pupils are equal, round, and reactive to light.  Neck: Neck supple.  Cardiovascular: Intact distal pulses.  An irregularly irregular rhythm present. Tachycardia present.   Pulses:      Radial pulses are 2+ on the right side, and 2+ on the left side.  Pulmonary/Chest: Breath sounds normal. Tachypnea noted.  Abdominal: Normal appearance and bowel sounds are normal. There is no tenderness.  Musculoskeletal:  Swelling, erythema and bruising from right mid-humerus distally to elbow, tender. Warm to palpation. Overlying small skin tear noted with foul smell. Culture obtained. Full ROM right elbow and shoulder.  Neurological: She is alert.  Oriented to person and place, not time.  Skin: Skin is  warm.    ED Course  Procedures (including critical care time) Labs Review Labs Reviewed  CBC WITH DIFFERENTIAL - Abnormal; Notable for the following:    WBC 14.1 (*)    Hemoglobin 11.5 (*)    HCT 35.9 (*)    Neutrophils Relative % 91 (*)    Lymphocytes Relative 4 (*)    Neutro Abs 12.8 (*)    Lymphs Abs 0.6 (*)    All other components within normal limits  COMPREHENSIVE METABOLIC PANEL - Abnormal; Notable for the following:    Sodium 134 (*)    Potassium 5.6 (*)    Glucose, Bld 320 (*)    BUN 38 (*)    Creatinine, Ser 1.16 (*)    Albumin 3.0 (*)  Alkaline Phosphatase 129 (*)    GFR calc non Af Amer 40 (*)    GFR calc Af Amer 46 (*)    All other components within normal limits  URINALYSIS, ROUTINE W REFLEX MICROSCOPIC - Abnormal; Notable for the following:    Glucose, UA >1000 (*)    All other components within normal limits  I-STAT CG4 LACTIC ACID, ED - Abnormal; Notable for the following:    Lactic Acid, Venous 4.91 (*)    All other components within normal limits  CULTURE, BLOOD (ROUTINE X 2)  CULTURE, BLOOD (ROUTINE X 2)  URINE CULTURE  URINE MICROSCOPIC-ADD ON  BASIC METABOLIC PANEL    Imaging Review Dg Chest 1 View  02-14-14   CLINICAL DATA:  Possible fall; right arm pain.  EXAM: RIGHT HUMERUS - 2+ VIEW  CHEST - 1 VIEW  COMPARISON:  Right humerus radiographs performed 11/14/2012, and chest radiograph performed 11/11/2013  FINDINGS: The right humerus appears grossly intact. There is chronic deformity of the proximal right humerus. There is no evidence of acute fracture or dislocation. The elbow joint is grossly unremarkable in appearance. No elbow joint effusion is identified. Prominent soft tissue swelling is noted along the distal right arm and overlying the olecranon.  The right humeral head remains seated at the glenoid fossa.  Chronic right basilar scarring is grossly unchanged, with mild chronically increased interstitial markings. No pleural effusion or  pneumothorax is seen. The cardiomediastinal silhouette is normal in size. New right paratracheal and right upper lung zone airspace opacity may reflect atelectasis or pneumonia.  Mild degenerative change is noted at the left glenohumeral joint.  IMPRESSION: 1. Chronic deformity of the proximal right humerus; no evidence of acute fracture or dislocation. Prominent soft tissue swelling along the distal right arm and overlying the olecranon. 2. Chronic lung changes again noted. New right paratracheal and right upper lung zone airspace opacity may reflect atelectasis or pneumonia. Would correlate for associated symptoms.   Electronically Signed   By: Roanna RaiderJeffery  Chang M.D.   On: 009-16-15 07:20   Dg Shoulder Right  02-14-14   CLINICAL DATA:  Possible fall; right shoulder pain.  EXAM: RIGHT SHOULDER - 2+ VIEW  COMPARISON:  Right shoulder radiographs performed 11/14/2012  FINDINGS: There is chronic deformity of the proximal right humerus. There is no evidence of acute fracture or dislocation. The right humeral head remains seated at the glenoid fossa. Surrounding osteophyte formation is noted. The right acromioclavicular joint demonstrates mild degenerative change, but is otherwise unremarkable.  No significant soft tissue abnormalities are characterized on radiograph. Chronic right-sided lung changes are again noted.  IMPRESSION: Chronic deformity of the proximal right humerus; no evidence of acute fracture or dislocation.   Electronically Signed   By: Roanna RaiderJeffery  Chang M.D.   On: 009-16-15 07:10   Dg Elbow Complete Right  02-14-14   CLINICAL DATA:  Possible fall. Right elbow pain and soft tissue swelling.  EXAM: RIGHT ELBOW - COMPLETE 3+ VIEW  COMPARISON:  Right elbow radiographs performed 11/14/2012  FINDINGS: There is no evidence of fracture or dislocation. The visualized joint spaces are preserved. No significant joint effusion is identified. Dorsal soft tissue swelling is noted overlying the olecranon.   IMPRESSION: No evidence of fracture or dislocation.   Electronically Signed   By: Roanna RaiderJeffery  Chang M.D.   On: 009-16-15 07:08   Dg Humerus Right  02-14-14   CLINICAL DATA:  Possible fall; right arm pain.  EXAM: RIGHT HUMERUS - 2+ VIEW  CHEST -  1 VIEW  COMPARISON:  Right humerus radiographs performed 11/14/2012, and chest radiograph performed 11/11/2013  FINDINGS: The right humerus appears grossly intact. There is chronic deformity of the proximal right humerus. There is no evidence of acute fracture or dislocation. The elbow joint is grossly unremarkable in appearance. No elbow joint effusion is identified. Prominent soft tissue swelling is noted along the distal right arm and overlying the olecranon.  The right humeral head remains seated at the glenoid fossa.  Chronic right basilar scarring is grossly unchanged, with mild chronically increased interstitial markings. No pleural effusion or pneumothorax is seen. The cardiomediastinal silhouette is normal in size. New right paratracheal and right upper lung zone airspace opacity may reflect atelectasis or pneumonia.  Mild degenerative change is noted at the left glenohumeral joint.  IMPRESSION: 1. Chronic deformity of the proximal right humerus; no evidence of acute fracture or dislocation. Prominent soft tissue swelling along the distal right arm and overlying the olecranon. 2. Chronic lung changes again noted. New right paratracheal and right upper lung zone airspace opacity may reflect atelectasis or pneumonia. Would correlate for associated symptoms.   Electronically Signed   By: Roanna Raider M.D.   On: Dec 05, 2013 07:20     EKG Interpretation None      MDM   Final diagnoses:  Sepsis due to cellulitis   Pt presenting with right arm swelling, bruising and erythema. She is ill-appearing, tachycardic, tachypneic, febrile rectally at 100.3. Probable sepsis from small wound over swelling and bruising. Septic workup initiated, broad-spectrum  antibiotics started after blood cultures obtained. Lactic acid 4.91. Leukocytosis 14.1. X-rays of right shoulder, arm and elbow without any acute fracture. 8:22 AM Hyperkalemic 5.6. EKG in rapid A. fib with prolonged QT. Patient remains tachycardic despite fluid bolus, will give Cardizem bolus and start Cardizem drip. Patient will be admitted to step down, admission accepted by Dr. Janee Morn, [facility].  Case discussed with attending Dr. Fayrene Fearing who also evaluated patient and agrees with plan of care.   Trevor Mace, PA-C 2013/12/05 0825

## 2013-11-19 NOTE — Progress Notes (Signed)
OT Cancellation Note  Patient Details Name: Lori [name] MRN: [id number] DOB: 09-09-1920   Cancelled Treatment:    Reason Eval/Treat Not Completed: Medical issues which prohibited therapy (inc HR/ low BP. Will attempt tomorrow.)  [facility] CenterWARD,HILLARY Hilary Ward, OTR/L  [phone] 11/06/2013 11/18/2013, 2:01 PM

## 2013-11-19 NOTE — ED Notes (Signed)
Bed: RESB Expected date:  Expected time:  Means of arrival:  Comments: EMS 80yo F

## 2013-11-19 NOTE — Progress Notes (Signed)
Spoke to son Dorene SorrowJerry about present patient status.  Patient has sepsis and hypotension requiring vasopressors.  He confirmed DNR status and stated she would not want to undergo any procedures.  He stated she would not want us to place a CVC.  We will use vasopressors but dose will be limited given peripheral use.

## 2013-11-19 NOTE — ED Notes (Signed)
Per EMS report: pt from [facility]: pt c/o of right arm swelling and pain.  Large hematoma on right elbow.  Staff denies any known fall. Pt denies falling. Pt a/o x 4 but is hard of hearing.

## 2013-11-19 NOTE — ED Notes (Signed)
Pt at X-ray

## 2013-11-19 NOTE — Consult Note (Addendum)
PULMONARY / CRITICAL CARE MEDICINE   Name: Lori [name] MRN: [id number] DOB: 06-11-1920    ADMISSION DATE:  11/08/2013 CONSULTATION DATE:  11/03/2013  REFERRING MD :  Ramiro Harvest PRIMARY SERVICE: [facility]  CHIEF COMPLAINT:  Cellulitis of the right arm and septic shock  BRIEF PATIENT DESCRIPTION: 80 year old SN[facility] resident who is wheelchair bound with dementia who presents to the hospital with right arm swelling.  Noticed to be cellulitic.  Patient is a full DNR but family wishes for pressor use to support patient.  PCCM is consulted for management of hypotension.  SIGNIFICANT EVENTS / STUDIES:  6/21 admission for cellulitis and septic shock.  LINES / TUBES: L [place] TLC 6/21>>>  CULTURES: Blood 6/21>>> Urine 6/21>>> Sputum 6/21>>>  ANTIBIOTICS: Vancomycin 6/21>>> Zosyn 6/21>>>  PAST MEDICAL HISTORY :  Past Medical History  Diagnosis Date  . Diabetes mellitus   . Hypertension   . Atrial fibrillation     with RVR  . Hyperlipidemia   . Peripheral neuropathy   . Gastric AVM   . GERD (gastroesophageal reflux disease)   . Diabetic Charcot foot   . Shortness of breath   . COPD (chronic obstructive pulmonary disease)     excerbation  . Anemia   . Leukocytosis    Past Surgical History  Procedure Laterality Date  . Tonsillectomy    . Lung removal, partial    . Thyroidectomy, partial    . Hip arthroplasty Left 08/18/2012    Procedure: ARTHROPLASTY BIPOLAR HIP;  Surgeon: Eldred Manges, MD;  Location: [facility];  Service: Orthopedics;  Laterality: Left;  . Intramedullary (im) nail intertrochanteric Right 09/16/2013    Procedure: INTRAMEDULLARY (IM) NAIL INTERTROCHANTRIC;  Surgeon: Nadara Mustard, MD;  Location: [facility];  Service: Orthopedics;  Laterality: Right;   Prior to Admission medications   Medication Sig Start Date End Date Taking? Authorizing Deriyah Kunath  acetaminophen (TYLENOL) 325 MG tablet Take 650 mg by mouth 3 (three) times daily.   Yes Historical Brenn Gatton, MD  beta carotene  w/minerals (OCUVITE) tablet Take 1 tablet by mouth daily.   Yes Historical Rhylee Pucillo, MD  budesonide (PULMICORT) 180 MCG/ACT inhaler Inhale 1 puff into the lungs 2 (two) times daily.   Yes Historical Raquell Richer, MD  calcium carbonate (OS-CAL) 600 MG TABS Take 600 mg by mouth 2 (two) times daily with a meal.   Yes Historical Dinah Lupa, MD  Cholecalciferol (VITAMIN D-3) 5000 UNITS TABS Take 1 tablet by mouth every morning.    Yes Historical Cambell Rickenbach, MD  furosemide (LASIX) 20 MG tablet Take 20 mg by mouth daily.   Yes Historical Trypp Heckmann, MD  guaiFENesin (MUCINEX) 600 MG 12 hr tablet Take 1 tablet (600 mg total) by mouth 2 (two) times daily. 08/22/12  Yes Dorothea Ogle, MD  HYDROcodone-acetaminophen (NORCO/VICODIN) 5-325 MG per tablet Take 1 tablet by mouth every 6 (six) hours as needed for moderate pain or severe pain.   Yes Historical Brandley Aldrete, MD  insulin lispro (HUMALOG) 100 UNIT/ML injection Inject 0-9 Units into the skin 3 (three) times daily before meals. Per sliding scale   Yes Historical Winthrop Shannahan, MD  mirtazapine (REMERON) 30 MG tablet Take 30 mg by mouth at bedtime.   Yes Historical Margan Elias, MD  pantoprazole (PROTONIX) 40 MG tablet Take 40 mg by mouth every morning. 08/06/11 08/06/14 Yes Sosan Forrestine Him, MD  polyethylene glycol powder (GLYCOLAX/MIRALAX) powder Take 17 g by mouth every morning.   Yes Historical Ilea Hilton, MD  potassium chloride (K-DUR) 10 MEQ tablet  Take 10 mEq by mouth daily. 08/06/11 05/27/14 Yes Sosan Forrestine Him, MD  sennosides-docusate sodium (SENOKOT-S) 8.6-50 MG tablet Take 1 tablet by mouth every morning.   Yes Historical Miner Koral, MD  acetaminophen (TYLENOL) 500 MG tablet Take 1 tablet (500 mg total) by mouth every 6 (six) hours as needed for mild pain. 09/16/13   Nadara Mustard, MD  ondansetron (ZOFRAN) 4 MG tablet Take 1 tablet (4 mg total) by mouth every 6 (six) hours as needed for nausea. 09/19/13   Christiane Ha, MD  zolpidem (AMBIEN) 5 MG tablet Take 5 mg by mouth at  bedtime as needed for sleep.    Historical Bianna Haran, MD   Allergies  Allergen Reactions  . Aspirin Other (See Comments)    Unknown   . Indapamide Other (See Comments)    Unknown   . Metformin And Related Other (See Comments)    Unknown   . Penicillins Other (See Comments)    Unknown   . Sulfa Antibiotics Other (See Comments)    Unknown   . Tiotropium Bromide Monohydrate Other (See Comments)    Unknown     FAMILY HISTORY:  Family History  Problem Relation Age of Onset  . Cancer Mother 70    melanoma  . Aneurysm Father     stomach aneurysm   SOCIAL HISTORY:  reports that she quit smoking about 11 years ago. Her smoking use included Cigarettes. She has a 5 pack-year smoking history. She does not have any smokeless tobacco history on file. She reports that she does not drink alcohol or use illicit drugs.  REVIEW OF SYSTEMS:  Unattainble due to AMS (dementia and now hypotension).  SUBJECTIVE: AMS  VITAL SIGNS: Temp:  [98.1 F (36.7 C)-100.3 F (37.9 C)] 98.1 F (36.7 C) (06/21 1115) Pulse Rate:  [53-128] 113 (06/21 1645) Resp:  [18-35] 29 (06/21 1645) BP: (53-138)/(33-83) 79/46 mmHg (06/21 1645) SpO2:  [93 %-100 %] 100 % (06/21 1645) Weight:  [97 lb (44 kg)-101 lb 10.1 oz (46.1 kg)] 101 lb 10.1 oz (46.1 kg) (06/21 1115) HEMODYNAMICS:   VENTILATOR SETTINGS:   INTAKE / OUTPUT: Intake/Output     06/20 0701 - 06/21 0700 06/21 0701 - 06/22 0700   I.V. (mL/kg)  390 (8.5)   IV Piggyback  150   Total Intake(mL/kg)  540 (11.7)   Urine (mL/kg/hr)  125 (0.3)   Total Output   125   Net   +415          PHYSICAL EXAMINATION: General:  Elderly frail female resting in exam bed. Neuro:  Moves all ext spontaneously, non-focal. HEENT:  [place]/AT, PERRL, EOM-I and DMM. Cardiovascular:  IRIR, Nl S1/S2, -M/R/G. Lungs:  Diffuse crackles. Abdomen:  Soft, NT, ND and +BS. Musculoskeletal:  -edema and right arm tenderness. Skin:  Frail, multiple areas of ecchymosis and  breakdown.  LABS:  CBC  Recent Labs Lab 12-12-2013 0643  WBC 14.1*  HGB 11.5*  HCT 35.9*  PLT 281   Coag's No results found for this basename: APTT, INR,  in the last 168 hours BMET  Recent Labs Lab 12/12/2013 0643 12-12-2013 0834  NA 134* 136*  K 5.6* 4.9  CL 99 104  CO2 21 21  BUN 38* 35*  CREATININE 1.16* 1.09  GLUCOSE 320* 303*   Electrolytes  Recent Labs Lab 12/12/2013 0643 December 12, 2013 0834 December 12, 2013 1140  CALCIUM 9.9 8.8  --   MG  --   --  1.2*   Sepsis Markers  Recent Labs  Lab 07-22-13 0639 07-22-13 0832  LATICACIDVEN 4.91*  --   PROCALCITON  --  10.47   ABG No results found for this basename: PHART, PCO2ART, PO2ART,  in the last 168 hours Liver Enzymes  Recent Labs Lab 07-22-13 0643  AST 33  ALT 14  ALKPHOS 129*  BILITOT 0.4  ALBUMIN 3.0*   Cardiac Enzymes  Recent Labs Lab 07-22-13 1140  TROPONINI <0.30   Glucose  Recent Labs Lab 07-22-13 1159  GLUCAP 270*    Imaging Dg Chest 1 View  12/31/13   CLINICAL DATA:  Possible fall; right arm pain.  EXAM: RIGHT HUMERUS - 2+ VIEW  CHEST - 1 VIEW  COMPARISON:  Right humerus radiographs performed 11/14/2012, and chest radiograph performed 11/11/2013  FINDINGS: The right humerus appears grossly intact. There is chronic deformity of the proximal right humerus. There is no evidence of acute fracture or dislocation. The elbow joint is grossly unremarkable in appearance. No elbow joint effusion is identified. Prominent soft tissue swelling is noted along the distal right arm and overlying the olecranon.  The right humeral head remains seated at the glenoid fossa.  Chronic right basilar scarring is grossly unchanged, with mild chronically increased interstitial markings. No pleural effusion or pneumothorax is seen. The cardiomediastinal silhouette is normal in size. New right paratracheal and right upper lung zone airspace opacity may reflect atelectasis or pneumonia.  Mild degenerative change is noted at  the left glenohumeral joint.  IMPRESSION: 1. Chronic deformity of the proximal right humerus; no evidence of acute fracture or dislocation. Prominent soft tissue swelling along the distal right arm and overlying the olecranon. 2. Chronic lung changes again noted. New right paratracheal and right upper lung zone airspace opacity may reflect atelectasis or pneumonia. Would correlate for associated symptoms.   Electronically Signed   By: Roanna RaiderJeffery  Chang M.D.   On: 008/02/15 07:20   Dg Shoulder Right  12/31/13   CLINICAL DATA:  Possible fall; right shoulder pain.  EXAM: RIGHT SHOULDER - 2+ VIEW  COMPARISON:  Right shoulder radiographs performed 11/14/2012  FINDINGS: There is chronic deformity of the proximal right humerus. There is no evidence of acute fracture or dislocation. The right humeral head remains seated at the glenoid fossa. Surrounding osteophyte formation is noted. The right acromioclavicular joint demonstrates mild degenerative change, but is otherwise unremarkable.  No significant soft tissue abnormalities are characterized on radiograph. Chronic right-sided lung changes are again noted.  IMPRESSION: Chronic deformity of the proximal right humerus; no evidence of acute fracture or dislocation.   Electronically Signed   By: Roanna RaiderJeffery  Chang M.D.   On: 008/02/15 07:10   Dg Elbow Complete Right  12/31/13   CLINICAL DATA:  Possible fall. Right elbow pain and soft tissue swelling.  EXAM: RIGHT ELBOW - COMPLETE 3+ VIEW  COMPARISON:  Right elbow radiographs performed 11/14/2012  FINDINGS: There is no evidence of fracture or dislocation. The visualized joint spaces are preserved. No significant joint effusion is identified. Dorsal soft tissue swelling is noted overlying the olecranon.  IMPRESSION: No evidence of fracture or dislocation.   Electronically Signed   By: Roanna RaiderJeffery  Chang M.D.   On: 008/02/15 07:08   Dg Humerus Right  12/31/13   CLINICAL DATA:  Possible fall; right arm pain.  EXAM: RIGHT  HUMERUS - 2+ VIEW  CHEST - 1 VIEW  COMPARISON:  Right humerus radiographs performed 11/14/2012, and chest radiograph performed 11/11/2013  FINDINGS: The right humerus appears grossly intact. There is chronic deformity of the proximal  right humerus. There is no evidence of acute fracture or dislocation. The elbow joint is grossly unremarkable in appearance. No elbow joint effusion is identified. Prominent soft tissue swelling is noted along the distal right arm and overlying the olecranon.  The right humeral head remains seated at the glenoid fossa.  Chronic right basilar scarring is grossly unchanged, with mild chronically increased interstitial markings. No pleural effusion or pneumothorax is seen. The cardiomediastinal silhouette is normal in size. New right paratracheal and right upper lung zone airspace opacity may reflect atelectasis or pneumonia.  Mild degenerative change is noted at the left glenohumeral joint.  IMPRESSION: 1. Chronic deformity of the proximal right humerus; no evidence of acute fracture or dislocation. Prominent soft tissue swelling along the distal right arm and overlying the olecranon. 2. Chronic lung changes again noted. New right paratracheal and right upper lung zone airspace opacity may reflect atelectasis or pneumonia. Would correlate for associated symptoms.   Electronically Signed   By: Roanna RaiderJeffery  Chang M.D.   On: 10/30/2013 07:20     CXR: Chronic lung changes.   ASSESSMENT / PLAN:  PULMONARY A: No acute or new active issues. P:   - Supplemental O2. - DNI.  CARDIOVASCULAR A: A-fib, not a candidate for anti-coag given frequent fall. Hypotension likely septic related. P:  - Son refused central access. - Limit levophed to 10 mcg and would only provide that for under 1 day given that no central line is allowed. - Needs to discuss comfort care with family if fails to respond to terms above.  RENAL A:  Acute on chronic renal failure. P:   - Gentle hydration  only.  GASTROINTESTINAL A:  No active issues at this time. P:   - Monitor.  HEMATOLOGIC A:  Leukocytosis due to cellulitis. P:  - Monitor with daily CBC.  INFECTIOUS A:  Cellulitis of RUE. P:   - Abx, agree with abx choices. - BP support as above. - F/U on cultures.  ENDOCRINE A:  ?adrenal insufficieny   P:   - Check cortisol level. - Stress dose steroids.  NEUROLOGIC A:  No active concerns that are acute. P:   - Minimize sedation.  TODAY'S SUMMARY: 80 year old female, full DNR, son refused TLC placement per EMD conversation with him.  Therefore at this point, would recommend limiting the levophed use to only 10 mcg and would not use it for more than 1 day given complications.  Spoke with son face to face.  After discussion, will limit levophed to above and only for one bag beyond that comfort.  I will leave morphine pushes available for when family starts feeling patient is becoming more uncomfortable.  If worsening then RN is to call the box and start drip.  Dr. Janee Mornhompson aware and agrees.  PCCM will sign off, please call back if needed.  I have personally obtained a history, examined the patient, evaluated laboratory and imaging results, formulated the assessment and plan and placed orders.  CRITICAL CARE: The patient is critically ill with multiple organ systems failure and requires high complexity decision making for assessment and support, frequent evaluation and titration of therapies, application of advanced monitoring technologies and extensive interpretation of multiple databases. Critical Care Time devoted to patient care services described in this note is 35 minutes.   Alyson ReedyWesam G. Yacoub, M.D. [facility]. Pager: [phone]. After hours pager: [phone].  10/30/2013, 4:58 PM

## 2013-11-19 NOTE — Progress Notes (Signed)
Patient's condition worsening, with hypotension and HR in 130s. Patient likely going into septic shock. Cardiology has been consulted for management of afib. Cardizem gtt has been discontinued. Patient has been pancultured and on empiric IV antibiotics. Will place on levophed gtt. Continue IVF. Consult with PCCM for further evaluation and rxcs. Called and spoke to Son, Garry HeaterJohnny Fargo of patient's condition. Son heading to hospital.

## 2013-11-20 DIAGNOSIS — Z515 Encounter for palliative care: Secondary | ICD-10-CM

## 2013-11-20 DIAGNOSIS — T148XXA Other injury of unspecified body region, initial encounter: Secondary | ICD-10-CM | POA: Diagnosis present

## 2013-11-20 DIAGNOSIS — R6520 Severe sepsis without septic shock: Secondary | ICD-10-CM

## 2013-11-20 DIAGNOSIS — R6521 Severe sepsis with septic shock: Secondary | ICD-10-CM

## 2013-11-20 DIAGNOSIS — A419 Sepsis, unspecified organism: Secondary | ICD-10-CM

## 2013-11-20 LAB — BASIC METABOLIC PANEL
BUN: 38 mg/dL — AB (ref 6–23)
CO2: 17 mEq/L — ABNORMAL LOW (ref 19–32)
CREATININE: 1.19 mg/dL — AB (ref 0.50–1.10)
Calcium: 8.9 mg/dL (ref 8.4–10.5)
Chloride: 109 mEq/L (ref 96–112)
GFR calc non Af Amer: 39 mL/min — ABNORMAL LOW (ref >90)
GFR, EST AFRICAN AMERICAN: 45 mL/min — AB (ref >90)
GLUCOSE: 196 mg/dL — AB (ref 70–99)
Potassium: 4.6 mEq/L (ref 3.7–5.3)
Sodium: 138 mEq/L (ref 137–147)

## 2013-11-20 LAB — LACTIC ACID, PLASMA: Lactic Acid, Venous: 2.8 mmol/L — ABNORMAL HIGH (ref 0.5–2.2)

## 2013-11-20 LAB — URINE CULTURE

## 2013-11-20 LAB — CBC
HEMATOCRIT: 31.9 % — AB (ref 36.0–46.0)
Hemoglobin: 9.9 g/dL — ABNORMAL LOW (ref 12.0–15.0)
MCH: 28.3 pg (ref 26.0–34.0)
MCHC: 31 g/dL (ref 30.0–36.0)
MCV: 91.1 fL (ref 78.0–100.0)
Platelets: 171 10*3/uL (ref 150–400)
RBC: 3.5 MIL/uL — ABNORMAL LOW (ref 3.87–5.11)
RDW: 15 % (ref 11.5–15.5)
WBC: 5.5 10*3/uL (ref 4.0–10.5)

## 2013-11-20 LAB — GLUCOSE, CAPILLARY: Glucose-Capillary: 166 mg/dL — ABNORMAL HIGH (ref 70–99)

## 2013-11-20 LAB — CORTISOL: CORTISOL PLASMA: 120.3 ug/dL

## 2013-11-20 LAB — MAGNESIUM: Magnesium: 2.5 mg/dL (ref 1.5–2.5)

## 2013-11-20 MED ORDER — IPRATROPIUM BROMIDE 0.02 % IN SOLN
0.5000 mg | Freq: Three times a day (TID) | RESPIRATORY_TRACT | Status: DC
  Administered 2013-11-20: 0.5 mg via RESPIRATORY_TRACT
  Filled 2013-11-20: qty 2.5

## 2013-11-20 MED ORDER — DIGOXIN 0.25 MG/ML IJ SOLN: 0.1250 mg | Freq: Every day | INTRAMUSCULAR | Status: DC

## 2013-11-20 MED ORDER — SCOPOLAMINE 1 MG/3DAYS TD PT72
1.0000 | MEDICATED_PATCH | TRANSDERMAL | Status: DC
  Administered 2013-11-20: 1.5 mg via TRANSDERMAL
  Filled 2013-11-20: qty 1

## 2013-11-20 MED ORDER — LORAZEPAM 2 MG/ML IJ SOLN
1.0000 mg | INTRAMUSCULAR | Status: DC | PRN
Start: 2013-11-20 — End: 2013-11-22

## 2013-11-20 MED ORDER — MORPHINE BOLUS VIA INFUSION
2.0000 mg | INTRAVENOUS | Status: DC | PRN
Start: 2013-11-20 — End: 2013-11-22
  Filled 2013-11-20: qty 2

## 2013-11-20 MED ORDER — LEVALBUTEROL HCL 0.63 MG/3ML IN NEBU
0.6300 mg | INHALATION_SOLUTION | Freq: Three times a day (TID) | RESPIRATORY_TRACT | Status: DC
  Administered 2013-11-20: 0.63 mg via RESPIRATORY_TRACT
  Filled 2013-11-20: qty 3

## 2013-11-20 MED ORDER — SODIUM CHLORIDE 0.9 % IV SOLN
1.0000 mg/h | INTRAVENOUS | Status: DC
  Administered 2013-11-20: 1 mg/h via INTRAVENOUS
  Administered 2013-11-22: 2 mg/h via INTRAVENOUS
  Filled 2013-11-20 (×2): qty 10

## 2013-11-20 MED ORDER — IPRATROPIUM-ALBUTEROL 0.5-2.5 (3) MG/3ML IN SOLN: 3.0000 mL | RESPIRATORY_TRACT | Status: DC | PRN

## 2013-11-20 NOTE — Progress Notes (Signed)
OT Cancellation Note  Patient Details Name: Lori [name] MRN: [id number] DOB: Jan 05, 1921   Cancelled Treatment:    Reason Eval/Treat Not Completed: Medical issues which prohibited therapy (Patient not medically ready (low BP, incr. HR))  [name], [place] KungLorraine D 11/20/2013, 9:26 AM

## 2013-11-20 NOTE — Progress Notes (Signed)
[facility] PROGRESS NOTE  KIRAH STICE ZOX:[id number] DOB: 07/13/1920 DOA: 11/10/2013 PCP: Thayer Headings, MD  Assessment/Plan: #1 septic shock/sepsis secondary to right upper extremity cellulitis and probably another source May be secondary to right upper extremity cellulitis and probably another source. Patient has been pancultured and results pending. Chest x-ray on admission was negative for any acute infiltrate. Blood cultures are pending. Urinalysis was negative. Lactic acid and pro calcitonin elevated on admission. Some improvement with blood pressure. Patient was only Levophed yesterday for one bag only per family request. Patient on IV steroids. Family request and for comfort approach. Will discontinue IV antibiotics. No more pressors. Will discontinue IV steroids. Palliative care consultation for goals of care.  #2 right upper extremity cellulitis/hematoma Patient was started empirically on IV vancomycin and IV Zosyn. Family request to full comfort measures. Will discontinue IV antibiotics per family request.  #3 A. fib with RVR Likely secondary to problem #1. Check enzymes negative x3. Magnesium was 1.2 and was replaced yesterday. Patient was started on IV digoxin per cardiology recommendations and Cardizem drip discontinued. Heart rate in the 120s. Cardiology following.  #4 chronic kidney disease III Stable.  #5 COPD Stable. No wheezing noted on examination. On scheduled meds.  #6 hyperkalemia Repeat basic metabolic profile showed normal potassium.  #7 anemia Hemoglobin at 9.9 this morning from 11.5 on admission. However hemoglobin was 9.1 on 09/19/2013. Likely dilutional component. No overt bleeding. Follow.  #8 gastroesophageal reflux disease PPI.  #9 hypertension Patient was hypotensive. Cardizem drip has been discontinued. Blood pressure in the low 90s.  #10 leukocytosis Likely secondary to problem #1. WBC is improved. Follow.  #11 prophylaxis PPI for GI  prophylaxis. SCDs for DVT prophylaxis.  #12 prognosis Patient with a poor prognosis. Patient is 13 presenting with sepsis and septic shock was requiring pressors and currently on IV steroids. Blood pressure is borderline. Patient lethargic with some confusion. Patient with dementia. Patient be tolerated yesterday and likely may not survive this hospitalization. Family requesting comfort measures. We'll discontinue IV antibiotics. No more pressors. Will discontinue IV steroids. Patient has been started on IV morphine as needed for comfort. Will transfer to a MedSurg floor. Will consult with palliative care for goals of care.   Code Status: DO NOT RESUSCITATE Family Communication: Updated son, Gina Leblond at bedside. Disposition Plan: Transfer to MedSurg   Consultants:  PCCM: Dr. Molli Knock 11/17/2013  Cardiology: Dr. Peter Swaziland 11/18/2013  Palliative care pending  Procedures:  Chest x-ray 11/16/2013  Plain films of the right shoulder, right elbow, right humerus 11/25/2013  Antibiotics:  IV vancomycin 11/16/2013  IV Zosyn 11/25/2013  HPI/Subjective: Patient sleeping, arousable with incomprehensible speech. Confusion.  Objective: Filed Vitals:   11/20/13 0400  BP:   Pulse:   Temp: 99.7 F (37.6 C)  Resp:     Intake/Output Summary (Last 24 hours) at 11/20/13 0842 Last data filed at 11/20/13 8119  Gross per 24 hour  Intake 2284.38 ml  Output    125 ml  Net 2159.38 ml   Filed Weights   11/10/2013 0626 10/30/2013 1115 11/20/13 0400  Weight: 44 kg (97 lb) 46.1 kg (101 lb 10.1 oz) 48.5 kg (106 lb 14.8 oz)    Exam:   General:  Sleeping. Arousable with incomprehensible speech.   Cardiovascular: Irregularly irregular  Respiratory: clear to auscultation anterior lung fields  Abdomen: soft, nontender, nondistended, positive bowel sounds.  Musculoskeletal: right upper extremity with erythema, ecchymosis, warmth, probable underlying hematoma. Bilateral lower extremities  with no edema.  Data Reviewed: Basic Metabolic Panel:  Recent Labs Lab 11/17/2013 0643 11/28/2013 0834 11/05/2013 1140 11/20/13 0330  NA 134* 136*  --  138  K 5.6* 4.9  --  4.6  CL 99 104  --  109  CO2 21 21  --  17*  GLUCOSE 320* 303*  --  196*  BUN 38* 35*  --  38*  CREATININE 1.16* 1.09  --  1.19*  CALCIUM 9.9 8.8  --  8.9  MG  --   --  1.2* 2.5   Liver Function Tests:  Recent Labs Lab 11/05/2013 0643  AST 33  ALT 14  ALKPHOS 129*  BILITOT 0.4  PROT 8.3  ALBUMIN 3.0*   No results found for this basename: LIPASE, AMYLASE,  in the last 168 hours No results found for this basename: AMMONIA,  in the last 168 hours CBC:  Recent Labs Lab 11/04/2013 0643 11/20/13 0330  WBC 14.1* 5.5  NEUTROABS 12.8*  --   HGB 11.5* 9.9*  HCT 35.9* 31.9*  MCV 90.2 91.1  PLT 281 171   Cardiac Enzymes:  Recent Labs Lab 11/14/2013 1140 10/31/2013 1714 11/25/2013 2249  TROPONINI <0.30 <0.30 <0.30   BNP (last 3 results)  Recent Labs  05/28/13 0527 05/30/13 0350  PROBNP 4043.0* 3363.0*   CBG:  Recent Labs Lab 11/07/2013 1159 11/12/2013 1548  GLUCAP 270* 151*    Recent Results (from the past 240 hour(s))  CULTURE, BLOOD (ROUTINE X 2)     Status: None   Collection Time    11/21/2013  6:43 AM      Result Value Ref Range Status   Specimen Description BLOOD LEFT ARM   Final   Special Requests BOTTLES DRAWN AEROBIC AND ANAEROBIC 5CC   Final   Culture  Setup Time     Final   Value: 10/31/2013 14:59     Performed at [facility]   Culture     Final   Value:        BLOOD CULTURE RECEIVED NO GROWTH TO DATE CULTURE WILL BE HELD FOR 5 DAYS BEFORE ISSUING A FINAL NEGATIVE REPORT     Performed at [facility]   Report Status PENDING   Incomplete  MRSA PCR SCREENING     Status: None   Collection Time    11/13/2013 10:56 AM      Result Value Ref Range Status   MRSA by PCR NEGATIVE  NEGATIVE Final   Comment:            The GeneXpert MRSA Assay (FDA     approved for NASAL  specimens     only), is one component of a     comprehensive MRSA colonization     surveillance program. It is not     intended to diagnose MRSA     infection nor to guide or     monitor treatment for     MRSA infections.     Studies: Dg Chest 1 View  11/13/2013   CLINICAL DATA:  Possible fall; right arm pain.  EXAM: RIGHT HUMERUS - 2+ VIEW  CHEST - 1 VIEW  COMPARISON:  Right humerus radiographs performed 11/14/2012, and chest radiograph performed 11/11/2013  FINDINGS: The right humerus appears grossly intact. There is chronic deformity of the proximal right humerus. There is no evidence of acute fracture or dislocation. The elbow joint is grossly unremarkable in appearance. No elbow joint effusion is identified. Prominent soft tissue swelling is noted along the distal  right arm and overlying the olecranon.  The right humeral head remains seated at the glenoid fossa.  Chronic right basilar scarring is grossly unchanged, with mild chronically increased interstitial markings. No pleural effusion or pneumothorax is seen. The cardiomediastinal silhouette is normal in size. New right paratracheal and right upper lung zone airspace opacity may reflect atelectasis or pneumonia.  Mild degenerative change is noted at the left glenohumeral joint.  IMPRESSION: 1. Chronic deformity of the proximal right humerus; no evidence of acute fracture or dislocation. Prominent soft tissue swelling along the distal right arm and overlying the olecranon. 2. Chronic lung changes again noted. New right paratracheal and right upper lung zone airspace opacity may reflect atelectasis or pneumonia. Would correlate for associated symptoms.   Electronically Signed   By: Roanna RaiderJeffery  Chang M.D.   On: 11/08/2013 07:20   Dg Shoulder Right  11/24/2013   CLINICAL DATA:  Possible fall; right shoulder pain.  EXAM: RIGHT SHOULDER - 2+ VIEW  COMPARISON:  Right shoulder radiographs performed 11/14/2012  FINDINGS: There is chronic deformity of the  proximal right humerus. There is no evidence of acute fracture or dislocation. The right humeral head remains seated at the glenoid fossa. Surrounding osteophyte formation is noted. The right acromioclavicular joint demonstrates mild degenerative change, but is otherwise unremarkable.  No significant soft tissue abnormalities are characterized on radiograph. Chronic right-sided lung changes are again noted.  IMPRESSION: Chronic deformity of the proximal right humerus; no evidence of acute fracture or dislocation.   Electronically Signed   By: Roanna RaiderJeffery  Chang M.D.   On: 11/21/2013 07:10   Dg Elbow Complete Right  10/30/2013   CLINICAL DATA:  Possible fall. Right elbow pain and soft tissue swelling.  EXAM: RIGHT ELBOW - COMPLETE 3+ VIEW  COMPARISON:  Right elbow radiographs performed 11/14/2012  FINDINGS: There is no evidence of fracture or dislocation. The visualized joint spaces are preserved. No significant joint effusion is identified. Dorsal soft tissue swelling is noted overlying the olecranon.  IMPRESSION: No evidence of fracture or dislocation.   Electronically Signed   By: Roanna RaiderJeffery  Chang M.D.   On: 11/11/2013 07:08   Dg Humerus Right  11/15/2013   CLINICAL DATA:  Possible fall; right arm pain.  EXAM: RIGHT HUMERUS - 2+ VIEW  CHEST - 1 VIEW  COMPARISON:  Right humerus radiographs performed 11/14/2012, and chest radiograph performed 11/11/2013  FINDINGS: The right humerus appears grossly intact. There is chronic deformity of the proximal right humerus. There is no evidence of acute fracture or dislocation. The elbow joint is grossly unremarkable in appearance. No elbow joint effusion is identified. Prominent soft tissue swelling is noted along the distal right arm and overlying the olecranon.  The right humeral head remains seated at the glenoid fossa.  Chronic right basilar scarring is grossly unchanged, with mild chronically increased interstitial markings. No pleural effusion or pneumothorax is seen.  The cardiomediastinal silhouette is normal in size. New right paratracheal and right upper lung zone airspace opacity may reflect atelectasis or pneumonia.  Mild degenerative change is noted at the left glenohumeral joint.  IMPRESSION: 1. Chronic deformity of the proximal right humerus; no evidence of acute fracture or dislocation. Prominent soft tissue swelling along the distal right arm and overlying the olecranon. 2. Chronic lung changes again noted. New right paratracheal and right upper lung zone airspace opacity may reflect atelectasis or pneumonia. Would correlate for associated symptoms.   Electronically Signed   By: Roanna RaiderJeffery  Chang M.D.   On: 11/27/2013 07:20  Scheduled Meds: . acetaminophen  650 mg Oral TID  . antiseptic oral rinse  15 mL Mouth Rinse BID  . beta carotene w/minerals  1 tablet Oral Daily  . calcium carbonate  1 tablet Oral BID WC  . cholecalciferol  5,000 Units Oral q morning - 10a  . [START ON 11/21/2013] digoxin  0.125 mg Intravenous Daily  . digoxin  0.25 mg Intravenous Q8H  . docusate sodium  100 mg Oral BID  . fluticasone  1 puff Inhalation BID  . hydrocortisone sodium succinate  50 mg Intravenous 4 times per day  . insulin aspart  0-9 Units Subcutaneous TID WC  . ipratropium  0.5 mg Nebulization Once  . ipratropium  0.5 mg Nebulization TID  . levalbuterol  0.63 mg Nebulization Once  . levalbuterol  0.63 mg Nebulization TID  . mirtazapine  30 mg Oral QHS  . pantoprazole  40 mg Oral q morning - 10a  . piperacillin-tazobactam (ZOSYN)  IV  3.375 g Intravenous Q8H  . polyethylene glycol  17 g Oral Daily  . senna-docusate  1 tablet Oral q morning - 10a  . sodium chloride  3 mL Intravenous Q12H  . vancomycin  500 mg Intravenous Q24H   Continuous Infusions: . sodium chloride 100 mL/hr at 11/20/13 0015  . norepinephrine (LEVOPHED) Adult infusion Stopped (11/20/13 0200)    Principal Problem:   Septic shock Active Problems:   Sepsis due to cellulitis   Atrial  fibrillation with RVR   Sepsis   Cellulitis of arm, right   HTN (hypertension)   GERD (gastroesophageal reflux disease)   Leukocytosis   Anemia   Dementia without behavioral disturbance   DM type 2, uncontrolled, with renal complications   Chronic diastolic heart failure   CKD (chronic kidney disease) stage 3, GFR 30-59 ml/min   COPD (chronic obstructive pulmonary disease)   Depression   Hyperkalemia   Hematoma: RUE    Time spent: 45 mins    [facility] Center IncHOMPSON,DANIEL MD [facility] Pager [phone]. If 7PM-7AM, please contact night-coverage at [email or web address], password [email or web address] 11/20/2013, 8:42 AM  LOS: 1 day

## 2013-11-20 NOTE — Consult Note (Signed)
[facility]  Date: 11/20/2013   Patient Name: Lori [name]  DOB: June 02, 1920  MRN: [id number]  Age / Sex: 80 y.o., female   PCP: Thayer HeadingsBrian Mackenzie, MD Referring Physician: Rodolph Bonganiel Thompson V, MD  Active Problems: Principal Problem:   Septic shock Active Problems:   HTN (hypertension)   GERD (gastroesophageal reflux disease)   Leukocytosis   Anemia   Dementia without behavioral disturbance   DM type 2, uncontrolled, with renal complications   Chronic diastolic heart failure   CKD (chronic kidney disease) stage 3, GFR 30-59 ml/min   COPD (chronic obstructive pulmonary disease)   Depression   Sepsis due to cellulitis   Atrial fibrillation with RVR   Hyperkalemia   Sepsis   Cellulitis of arm, right   Hematoma: RUE   HPI/Reason for Consultation: [name] is a 80 y.o. female with multiple chronic medical problems admitted with sepsis related to a severe cellulitis in her right arm. Family have requested full comfort care approach given her age and frailty.  Participants in Discussion: 2 sons and DIL, patient unable to participate  Goals/Summary of Case:   Advance Directive: Yes-on file   Code Status Orders        Start     Ordered   11/21/2013 1108  Do not attempt resuscitation (DNR)   Continuous     11/02/2013 1107     I have reviewed the medical record, interviewed the patient and family, and examined the patient. The following aspects are pertinent.  Past Medical History  Diagnosis Date  . Diabetes mellitus   . Hypertension   . Atrial fibrillation     with RVR  . Hyperlipidemia   . Peripheral neuropathy   . Gastric AVM   . GERD (gastroesophageal reflux disease)   . Diabetic Charcot foot   . Shortness of breath   . COPD (chronic obstructive pulmonary disease)     excerbation  . Anemia   . Leukocytosis     History   Social History  . Marital Status: Widowed    Spouse Name: N/A    Number of Children: N/A  . Years of  Education: N/A   Social History Main Topics  . Smoking status: Former Smoker -- 0.50 packs/day for 10 years    Types: Cigarettes    Quit date: 04/28/2002  . Smokeless tobacco: None  . Alcohol Use: No  . Drug Use: No  . Sexual Activity: No   Other Topics Concern  . None   Social History Narrative  . None    Family History  Problem Relation Age of Onset  . Cancer Mother 4270    melanoma  . Aneurysm Father     stomach aneurysm    Scheduled Meds: . antiseptic oral rinse  15 mL Mouth Rinse BID  . scopolamine  1 patch Transdermal Q72H   Continuous Infusions: . sodium chloride 10 mL/hr at 11/20/13 0912  . morphine     PRN Meds:.acetaminophen, ipratropium-albuterol, LORazepam, morphine, ondansetron (ZOFRAN) IV Allergies  Allergen Reactions  . Aspirin Other (See Comments)    Unknown   . Indapamide Other (See Comments)    Unknown   . Metformin And Related Other (See Comments)    Unknown   . Penicillins Other (See Comments)    Unknown   . Sulfa Antibiotics Other (See Comments)    Unknown   . Tiotropium Bromide Monohydrate Other (See Comments)    Unknown    CBC:    Component  Value Date/Time   WBC 5.5 11/20/2013 0330   HGB 9.9* 11/20/2013 0330   HCT 31.9* 11/20/2013 0330   PLT 171 11/20/2013 0330   MCV 91.1 11/20/2013 0330   NEUTROABS 12.8* 11/08/2013 0643   LYMPHSABS 0.6* 11/18/2013 0643   MONOABS 0.7 11/26/2013 0643   EOSABS 0.0 11/16/2013 0643   BASOSABS 0.0 11/28/2013 0643    Comprehensive Metabolic Panel:    Component Value Date/Time   NA 138 11/20/2013 0330   K 4.6 11/20/2013 0330   CL 109 11/20/2013 0330   CO2 17* 11/20/2013 0330   BUN 38* 11/20/2013 0330   CREATININE 1.19* 11/20/2013 0330   GLUCOSE 196* 11/20/2013 0330   CALCIUM 8.9 11/20/2013 0330   AST 33 11/20/2013 0643   ALT 14 10/30/2013 0643   ALKPHOS 129* 11/03/2013 0643   BILITOT 0.4 11/10/2013 0643   PROT 8.3 11/13/2013 0643   ALBUMIN 3.0* 11/18/2013 0643     Vital Signs: BP 99/52  Pulse 110   Temp(Src) 97.9 F (36.6 C) (Axillary)  Resp 20  Ht 5\' 1"  (1.549 m)  Wt 48.5 kg (106 lb 14.8 oz)  BMI 20.21 kg/m2  SpO2 97%  LMP 07/29/1960 Filed Weights   11/04/2013 0626 11/05/2013 1115 11/20/13 0400  Weight: 44 kg (97 lb) 46.1 kg (101 lb 10.1 oz) 48.5 kg (106 lb 14.8 oz)   Very frail, moaning, moderate distress Right arm is weaping and swollen wrapped in chux pads Labored breathing Tachy irregular  Assessment/Prognosis: Primary Diagnoses  1. Sepsis, related to cellulitis 2. Malnutrition 3. Afib, hypotensive  Active Symptoms:  Severe pain, moaning   Agitation  Prognosis:  Hours- days  PPS 20%  [name] is actively dying of sepsis and family has elected for full comfiort care given her debility, advanced age and current QOL.   Scope of Treatment / Recommendations: -Full comfort -No abx -No meds except those related to her comfort -No IV fluids   Disposition: Hospital death anticipated   Time: 50 minutes Greater than 50%  of this time was spent counseling and coordinating care related to the above assessment and plan.  Signed by: Hilbert OdorGOLDING,ELIZABETH, DO  Elizabeth L Golding, DO  11/20/2013, 9:31 AM  Please contact Palliative Medicine Team phone at [phone] for questions and concerns.

## 2013-11-20 NOTE — Progress Notes (Signed)
CARE MANAGEMENT NOTE 11/20/2013  Patient:  Lori [name],Lori [name]   Account Number:  [id number]  Date Initiated:  11/20/2013  Documentation initiated by:  DAVIS,RHONDA  Subjective/Objective Assessment:   pt with sepsis and requiring vasopressor for bp support/dnr but family requested pressure support/     Action/Plan:   pt to be moved to 1342 for eol issues/hospital death anticipated at this point/iv vasopressors and iv cardizem dcd ac move to floor   Anticipated DC Date:  11/21/2013   Anticipated DC Plan:  HOME/SELF CARE  In-house referral  NA      DC Planning Services  NA      [facility]   Choice offered to / List presented to:  NA   DME arranged  NA      DME agency  NA     HH arranged  NA      HH agency  NA   Status of service:  In process, will continue to follow Medicare Important Message given?  NA - LOS <3 / Initial given by admissions (If response is "NO", the following Medicare IM given date fields will be blank) Date Medicare IM given:  10/30/2013 Date Additional Medicare IM given:    Discharge Disposition:    Per UR Regulation:  Reviewed for med. necessity/level of care/duration of stay  If discussed at Long Length of Stay Meetings, dates discussed:    Comments:  [id number]/Rhonda Stark JockDavis, RN, BSN, [place] [phone] Chart Reviewed for discharge and hospital needs. Discharge needs at time of review: None present will follow for needs.  hospital death is anticipated at time of this review. Review of patient progress due on [id number].

## 2013-11-20 NOTE — Progress Notes (Signed)
Subjective: Patinet moans some.   Objective: Filed Vitals:   11/20/13 0100 11/20/13 0200 11/20/13 0300 11/20/13 0400  BP: 96/48 111/46 92/63   Pulse: 103 124 110   Temp:    99.7 F (37.6 C)  TempSrc:    Axillary  Resp: 22 21 20    Height:      Weight:    106 lb 14.8 oz (48.5 kg)  SpO2: 100% 100% 100%    Weight change: 4 lb 10.1 oz (2.1 kg)  Intake/Output Summary (Last 24 hours) at 11/20/13 0736 Last data filed at 11/20/13 16100642  Gross per 24 hour  Intake 2284.38 ml  Output    125 ml  Net 2159.38 ml    General: Alert, awake, oriented x3, in no acute distress Neck:  JVP is normal Heart: Irregular rate and rhythm, without murmurs, rubs, gallops.  Lungs  Rhonchi Abd:  Decreased BS Exemities:  No edema.  Feet warm.     Tele:  Afib  Avg Hr 120s.    Lab Results: Results for orders placed during the hospital encounter of 11/24/2013 (from the past 24 hour(s))  PROCALCITONIN     Status: None   Collection Time    11/24/2013  8:32 AM      Result Value Ref Range   Procalcitonin 10.47    BASIC METABOLIC PANEL     Status: Abnormal   Collection Time    11/24/2013  8:34 AM      Result Value Ref Range   Sodium 136 (*) 137 - 147 mEq/L   Potassium 4.9  3.7 - 5.3 mEq/L   Chloride 104  96 - 112 mEq/L   CO2 21  19 - 32 mEq/L   Glucose, Bld 303 (*) 70 - 99 mg/dL   BUN 35 (*) 6 - 23 mg/dL   Creatinine, Ser 9.601.09  0.50 - 1.10 mg/dL   Calcium 8.8  8.4 - 45.410.5 mg/dL   GFR calc non Af Amer 43 (*) >90 mL/min   GFR calc Af Amer 50 (*) >90 mL/min  MRSA PCR SCREENING     Status: None   Collection Time    11/24/2013 10:56 AM      Result Value Ref Range   MRSA by PCR NEGATIVE  NEGATIVE  MAGNESIUM     Status: Abnormal   Collection Time    11/24/2013 11:40 AM      Result Value Ref Range   Magnesium 1.2 (*) 1.5 - 2.5 mg/dL  TSH     Status: None   Collection Time    11/24/2013 11:40 AM      Result Value Ref Range   TSH 1.890  0.350 - 4.500 uIU/mL  TROPONIN I     Status: None   Collection Time   11/24/2013 11:40 AM      Result Value Ref Range   Troponin I <0.30  <0.30 ng/mL  GLUCOSE, CAPILLARY     Status: Abnormal   Collection Time    11/24/2013 11:59 AM      Result Value Ref Range   Glucose-Capillary 270 (*) 70 - 99 mg/dL   Comment 1 Documented in Chart     Comment 2 Notify RN    GLUCOSE, CAPILLARY     Status: Abnormal   Collection Time    11/24/2013  3:48 PM      Result Value Ref Range   Glucose-Capillary 151 (*) 70 - 99 mg/dL   Comment 1 Documented in Chart     Comment 2  Notify RN    TROPONIN I     Status: None   Collection Time    August 04, 2013  5:14 PM      Result Value Ref Range   Troponin I <0.30  <0.30 ng/mL  PROTIME-INR     Status: None   Collection Time    August 04, 2013  5:14 PM      Result Value Ref Range   Prothrombin Time 15.1  11.6 - 15.2 seconds   INR 1.22  0.00 - 1.49  TROPONIN I     Status: None   Collection Time    August 04, 2013 10:49 PM      Result Value Ref Range   Troponin I <0.30  <0.30 ng/mL  CBC     Status: Abnormal   Collection Time    11/20/13  3:30 AM      Result Value Ref Range   WBC 5.5  4.0 - 10.5 K/uL   RBC 3.50 (*) 3.87 - 5.11 MIL/uL   Hemoglobin 9.9 (*) 12.0 - 15.0 g/dL   HCT 33.231.9 (*) 95.136.0 - 88.446.0 %   MCV 91.1  78.0 - 100.0 fL   MCH 28.3  26.0 - 34.0 pg   MCHC 31.0  30.0 - 36.0 g/dL   RDW 16.615.0  06.311.5 - 01.615.5 %   Platelets 171  150 - 400 K/uL  BASIC METABOLIC PANEL     Status: Abnormal   Collection Time    11/20/13  3:30 AM      Result Value Ref Range   Sodium 138  137 - 147 mEq/L   Potassium 4.6  3.7 - 5.3 mEq/L   Chloride 109  96 - 112 mEq/L   CO2 17 (*) 19 - 32 mEq/L   Glucose, Bld 196 (*) 70 - 99 mg/dL   BUN 38 (*) 6 - 23 mg/dL   Creatinine, Ser 0.101.19 (*) 0.50 - 1.10 mg/dL   Calcium 8.9  8.4 - 93.210.5 mg/dL   GFR calc non Af Amer 39 (*) >90 mL/min   GFR calc Af Amer 45 (*) >90 mL/min  MAGNESIUM     Status: None   Collection Time    11/20/13  3:30 AM      Result Value Ref Range   Magnesium 2.5  1.5 - 2.5 mg/dL  LACTIC ACID, PLASMA      Status: Abnormal   Collection Time    11/20/13  3:33 AM      Result Value Ref Range   Lactic Acid, Venous 2.8 (*) 0.5 - 2.2 mmol/L    Studies/Results: Dg Chest 1 View  07/01/2013   CLINICAL DATA:  Possible fall; right arm pain.  EXAM: RIGHT HUMERUS - 2+ VIEW  CHEST - 1 VIEW  COMPARISON:  Right humerus radiographs performed 11/14/2012, and chest radiograph performed 11/11/2013  FINDINGS: The right humerus appears grossly intact. There is chronic deformity of the proximal right humerus. There is no evidence of acute fracture or dislocation. The elbow joint is grossly unremarkable in appearance. No elbow joint effusion is identified. Prominent soft tissue swelling is noted along the distal right arm and overlying the olecranon.  The right humeral head remains seated at the glenoid fossa.  Chronic right basilar scarring is grossly unchanged, with mild chronically increased interstitial markings. No pleural effusion or pneumothorax is seen. The cardiomediastinal silhouette is normal in size. New right paratracheal and right upper lung zone airspace opacity may reflect atelectasis or pneumonia.  Mild degenerative change is noted at the left glenohumeral  joint.  IMPRESSION: 1. Chronic deformity of the proximal right humerus; no evidence of acute fracture or dislocation. Prominent soft tissue swelling along the distal right arm and overlying the olecranon. 2. Chronic lung changes again noted. New right paratracheal and right upper lung zone airspace opacity may reflect atelectasis or pneumonia. Would correlate for associated symptoms.   Electronically Signed   By: Roanna Raider M.D.   On: 08-Dec-2013 07:20   Dg Shoulder Right  12-08-13   CLINICAL DATA:  Possible fall; right shoulder pain.  EXAM: RIGHT SHOULDER - 2+ VIEW  COMPARISON:  Right shoulder radiographs performed 11/14/2012  FINDINGS: There is chronic deformity of the proximal right humerus. There is no evidence of acute fracture or dislocation. The  right humeral head remains seated at the glenoid fossa. Surrounding osteophyte formation is noted. The right acromioclavicular joint demonstrates mild degenerative change, but is otherwise unremarkable.  No significant soft tissue abnormalities are characterized on radiograph. Chronic right-sided lung changes are again noted.  IMPRESSION: Chronic deformity of the proximal right humerus; no evidence of acute fracture or dislocation.   Electronically Signed   By: Roanna Raider M.D.   On: 12-08-2013 07:10   Dg Elbow Complete Right  2013-12-08   CLINICAL DATA:  Possible fall. Right elbow pain and soft tissue swelling.  EXAM: RIGHT ELBOW - COMPLETE 3+ VIEW  COMPARISON:  Right elbow radiographs performed 11/14/2012  FINDINGS: There is no evidence of fracture or dislocation. The visualized joint spaces are preserved. No significant joint effusion is identified. Dorsal soft tissue swelling is noted overlying the olecranon.  IMPRESSION: No evidence of fracture or dislocation.   Electronically Signed   By: Roanna Raider M.D.   On: 12-08-13 07:08   Dg Humerus Right  2013/12/08   CLINICAL DATA:  Possible fall; right arm pain.  EXAM: RIGHT HUMERUS - 2+ VIEW  CHEST - 1 VIEW  COMPARISON:  Right humerus radiographs performed 11/14/2012, and chest radiograph performed 11/11/2013  FINDINGS: The right humerus appears grossly intact. There is chronic deformity of the proximal right humerus. There is no evidence of acute fracture or dislocation. The elbow joint is grossly unremarkable in appearance. No elbow joint effusion is identified. Prominent soft tissue swelling is noted along the distal right arm and overlying the olecranon.  The right humeral head remains seated at the glenoid fossa.  Chronic right basilar scarring is grossly unchanged, with mild chronically increased interstitial markings. No pleural effusion or pneumothorax is seen. The cardiomediastinal silhouette is normal in size. New right paratracheal and right  upper lung zone airspace opacity may reflect atelectasis or pneumonia.  Mild degenerative change is noted at the left glenohumeral joint.  IMPRESSION: 1. Chronic deformity of the proximal right humerus; no evidence of acute fracture or dislocation. Prominent soft tissue swelling along the distal right arm and overlying the olecranon. 2. Chronic lung changes again noted. New right paratracheal and right upper lung zone airspace opacity may reflect atelectasis or pneumonia. Would correlate for associated symptoms.   Electronically Signed   By: Roanna Raider M.D.   On: December 08, 2013 07:20    Medications: Reviewed   @PROBHOSP @  1.  Atrial fib  Rates remain in 120s  BP improved 115 now   Getting Dig load  Would start IV daily dose tomorrow  Not a candidate for anticoagulation  2.  Sepsis.  Per IM/CCM.    Patient is DNR  LOS: 1 day   Dietrich Pates 11/20/2013, 7:36 AM

## 2013-11-20 NOTE — Evaluation (Signed)
SLP Cancellation Note  Patient Details Name: Lori [name] MRN: [id number] DOB: Jan 06, 1921   Cancelled treatment:       Reason Eval/Treat Not Completed:  (order cancelled by MD, pt full comfort currently.  Please reorder if indicated.  Thanks. )   Mickie BailKimball, Tamara Ann Tamara White CityKimball, TennesseeMS [facility] SLP [phone]

## 2013-11-20 NOTE — Progress Notes (Signed)
PT Cancellation Note  Patient Details Name: Lewanda Rifedith N Sturtevant MRN: [id number] DOB: 1921-03-29   Cancelled Treatment:    Reason Eval/Treat Not Completed: Patient not medically ready (low BP, incr. HR)   Rada HayHill, Karen Elizabeth 11/20/2013, 8:30 AM Blanchard KelchKaren Hill PT [phone]

## 2013-11-21 DIAGNOSIS — T148XXA Other injury of unspecified body region, initial encounter: Secondary | ICD-10-CM

## 2013-11-21 NOTE — Progress Notes (Signed)
Nutrition Brief Note  Patient identified on [place] Report Chart reviewed. Pt now transitioning to comfort care.  No further nutrition interventions warranted at this time.  Please re-consult as needed.   Sarah F Beaty MS RD LDN Clinical Dietitian Pager:[phone]    

## 2013-11-21 NOTE — Progress Notes (Signed)
[facility] PROGRESS NOTE  Lori [name]:[id number] DOB: 1921-02-02 DOA: Dec 14, 2013 PCP: Thayer Headings, MD  Assessment/Plan: #1 septic shock/sepsis secondary to right upper extremity cellulitis and probably another source May be secondary to right upper extremity cellulitis and probably another source. Patient has been pancultured and results pending. Chest x-ray on admission was negative for any acute infiltrate. Blood cultures are pending. Urinalysis was negative. Lactic acid and pro calcitonin elevated on admission.  Patient has been seen by palliative care and the family requesting full comfort measures. All medications have been discontinued. IV antibiotics have been discontinued. IV steroids have been discontinued. No further labs. Patient has been placed on a morphine drip for comfort. Palliative care follow. Hospital death anticipated.  #2 right upper extremity cellulitis/hematoma Patient was started empirically on IV vancomycin and IV Zosyn. Family request to full comfort measures. IV antibiotics have been discontinued.   #3 A. fib with RVR Likely secondary to problem #1. Check enzymes negative x3. Magnesium was 1.2 and was replaced. Patient was started on IV digoxin per cardiology recommendations and Cardizem drip discontinued. Heart rate in the 130s. Full comfort measures. Cardiology has signed off.   #4 chronic kidney disease III Stable.  #5 COPD Stable. No wheezing noted on examination. Comfort measures.  #6 hyperkalemia Repeat basic metabolic profile showed normal potassium.  #7 anemia Hemoglobin at 9.9 this morning from 11.5 on admission. However hemoglobin was 9.1 on 09/19/2013. Likely dilutional component. No overt bleeding.   #8 gastroesophageal reflux disease  #9 hypertension Patient was hypotensive. Cardizem drip has been discontinued. Blood pressure in the low 60-70s. Comfort care  #10 leukocytosis Likely secondary to problem #1. Full comfort  measures. No further labs.  #11 prophylaxis PPI for GI prophylaxis. SCDs for DVT prophylaxis.  #12 prognosis Patient with a poor prognosis. Patient is 72 presenting with sepsis and septic shock was requiring pressors and currently on IV steroids. Blood pressure is borderline. Patient lethargic with some confusion. Patient with dementia. Patient be tolerated yesterday and likely may not survive this hospitalization. Family requesting comfort measures. Patient has been seen by palliative care at goal his full comfort. Patient has been started on a morphine drip. No further antibiotics, no medications except for comfort, no IV fluids. Hospital death anticipated.   Code Status: DO NOT RESUSCITATE Family Communication: Updated son, Yuritza Paulhus at bedside. Disposition Plan: Remain inpatient.   Consultants:  PCCM: Dr. Molli Knock 2013/12/14  Cardiology: Dr. Peter Swaziland 2013-12-14  Palliative care : Dr Phillips Odor 11/20/13  Procedures:  Chest x-ray 12-14-2013  Plain films of the right shoulder, right elbow, right humerus 2013/12/14  Antibiotics:  IV vancomycin December 14, 2013>>>>> 11/20/2013  IV Zosyn December 14, 2013>>>>11/20/13  HPI/Subjective: Patient lethargic with agonal breathing.  Objective: Filed Vitals:   11/21/13 0541  BP: 68/21  Pulse: 135  Temp: 98.6 F (37 C)  Resp: 14    Intake/Output Summary (Last 24 hours) at 11/21/13 1401 Last data filed at 11/21/13 0542  Gross per 24 hour  Intake  85.02 ml  Output    200 ml  Net -114.98 ml   Filed Weights   Dec 14, 2013 0626 12-14-2013 1115 11/20/13 0400  Weight: 44 kg (97 lb) 46.1 kg (101 lb 10.1 oz) 48.5 kg (106 lb 14.8 oz)    Exam:   General:  Lethargic with agonal breathing.  Cardiovascular: Irregularly irregular  Respiratory: clear to auscultation anterior lung fields  Abdomen: soft, nontender, nondistended, positive bowel sounds.  Musculoskeletal: right upper extremity with erythema, ecchymosis, warmth, probable underlying  hematoma.  Bilateral lower extremities with no edema.  Data Reviewed: Basic Metabolic Panel:  Recent Labs Lab 2014-01-14 0643 2014-01-14 0834 2014-01-14 1140 11/20/13 0330  NA 134* 136*  --  138  K 5.6* 4.9  --  4.6  CL 99 104  --  109  CO2 21 21  --  17*  GLUCOSE 320* 303*  --  196*  BUN 38* 35*  --  38*  CREATININE 1.16* 1.09  --  1.19*  CALCIUM 9.9 8.8  --  8.9  MG  --   --  1.2* 2.5   Liver Function Tests:  Recent Labs Lab 2014-01-14 0643  AST 33  ALT 14  ALKPHOS 129*  BILITOT 0.4  PROT 8.3  ALBUMIN 3.0*   No results found for this basename: LIPASE, AMYLASE,  in the last 168 hours No results found for this basename: AMMONIA,  in the last 168 hours CBC:  Recent Labs Lab 2014-01-14 0643 11/20/13 0330  WBC 14.1* 5.5  NEUTROABS 12.8*  --   HGB 11.5* 9.9*  HCT 35.9* 31.9*  MCV 90.2 91.1  PLT 281 171   Cardiac Enzymes:  Recent Labs Lab 2014-01-14 1140 2014-01-14 1714 2014-01-14 2249  TROPONINI <0.30 <0.30 <0.30   BNP (last 3 results)  Recent Labs  05/28/13 0527 05/30/13 0350  PROBNP 4043.0* 3363.0*   CBG:  Recent Labs Lab 2014-01-14 1159 2014-01-14 1548 11/20/13 0830  GLUCAP 270* 151* 166*    Recent Results (from the past 240 hour(s))  CULTURE, BLOOD (ROUTINE X 2)     Status: None   Collection Time    2014-01-14  6:43 AM      Result Value Ref Range Status   Specimen Description BLOOD LEFT ARM   Final   Special Requests BOTTLES DRAWN AEROBIC AND ANAEROBIC 5CC   Final   Culture  Setup Time     Final   Value: 01-09-2014 14:59     Performed at [facility]   Culture     Final   Value:        BLOOD CULTURE RECEIVED NO GROWTH TO DATE CULTURE WILL BE HELD FOR 5 DAYS BEFORE ISSUING A FINAL NEGATIVE REPORT     Performed at [facility]   Report Status PENDING   Incomplete  URINE CULTURE     Status: None   Collection Time    2014-01-14  6:46 AM      Result Value Ref Range Status   Specimen Description URINE, RANDOM   Final   Special Requests  NONE   Final   Culture  Setup Time     Final   Value: 01-09-2014 15:14     Performed at [facility]   Colony Count     Final   Value: 35,000 COLONIES/ML     Performed at [facility]   Culture     Final   Value: Multiple bacterial morphotypes present, none predominant. Suggest appropriate recollection if clinically indicated.     Performed at [facility]   Report Status 11/20/2013 FINAL   Final  MRSA PCR SCREENING     Status: None   Collection Time    2014-01-14 10:56 AM      Result Value Ref Range Status   MRSA by PCR NEGATIVE  NEGATIVE Final   Comment:            The GeneXpert MRSA Assay (FDA     approved for NASAL specimens     only),  is one component of a     comprehensive MRSA colonization     surveillance program. It is not     intended to diagnose MRSA     infection nor to guide or     monitor treatment for     MRSA infections.     Studies: No results found.  Scheduled Meds: . antiseptic oral rinse  15 mL Mouth Rinse BID  . scopolamine  1 patch Transdermal Q72H   Continuous Infusions: . morphine 1 mg/hr (11/20/13 0932)    Principal Problem:   Septic shock Active Problems:   Sepsis due to cellulitis   Atrial fibrillation with RVR   Sepsis   Cellulitis of arm, right   HTN (hypertension)   GERD (gastroesophageal reflux disease)   Leukocytosis   Anemia   Dementia without behavioral disturbance   DM type 2, uncontrolled, with renal complications   Chronic diastolic heart failure   CKD (chronic kidney disease) stage 3, GFR 30-59 ml/min   COPD (chronic obstructive pulmonary disease)   Depression   Hyperkalemia   Hematoma: RUE    Time spent: 45 mins    Brainard Surgery CenterHOMPSON,DANIEL MD [facility] Pager [phone]. If 7PM-7AM, please contact night-coverage at [email or web address], password [email or web address] 11/21/2013, 2:01 PM  LOS: 2 days

## 2013-11-21 NOTE — Progress Notes (Signed)
Based upon review of chart - Pt has now been made full comfort care.  Cardiology will sign off for now.  Please call with ?s or concerns.  Marykay LexHARDING,DAVID W, M.D., M.S. Interventional Cardiologist   Pager # [phone] 11/21/2013

## 2013-11-21 NOTE — Progress Notes (Signed)
Pt's son and grandson were bedside when I arrived. Pt's son said he's surprised she's (pt) is still around. He said they are fine and appreciated the visit. Pt's son said he has ? for Dr. Phillips OdorGolding; gave mssg to nurse who lft mssg w/service. Pls pg as need arises. [phone] Marjory LiesPamela Carrington Holder Chaplain

## 2013-11-25 LAB — CULTURE, BLOOD (ROUTINE X 2): CULTURE: NO GROWTH

## 2013-11-29 NOTE — Progress Notes (Signed)
[facility] Progress [facility]   SUBJECTIVE: Unresponsive. No distress. Periods of apnea. Patient son at bedside.   OBJECTIVE: Vital Signs: BP 65/28  Pulse 152  Temp(Src) 103 F (39.4 C) (Axillary)  Resp 22  Ht 5\' 1"  (1.549 m)  Wt 48.5 kg (106 lb 14.8 oz)  BMI 20.21 kg/m2  SpO2 87%  LMP 07/29/1960   Intake and Output: 06/23 0701 - 06/24 0700 In: -  Out: 110 [Urine:110]  Physical Exam: General: Vital signs reviewed and noted. Very frail, no grimace  Head: Normocephalic, atraumatic.  Lungs:  Ronchi+  Heart: Tachy-irregular  Abdomen:  BS normoactive. Soft, Nondistended, non-tender.  No masses or organomegaly.  Extremities: Right arm wrapped in chux- weeping, red swollen- no mottling of distal extremities    Allergies  Allergen Reactions  . Aspirin Other (See Comments)    Unknown   . Indapamide Other (See Comments)    Unknown   . Metformin And Related Other (See Comments)    Unknown   . Penicillins Other (See Comments)    Unknown   . Sulfa Antibiotics Other (See Comments)    Unknown   . Tiotropium Bromide Monohydrate Other (See Comments)    Unknown     Medications: Scheduled Meds:  . antiseptic oral rinse  15 mL Mouth Rinse BID  . scopolamine  1 patch Transdermal Q72H    Continuous Infusions: . morphine 2 mg/hr (11/17/2013 0006)    PRN Meds: acetaminophen, ipratropium-albuterol, LORazepam, morphine, ondansetron (ZOFRAN) IV  Stool Softner: yes  Palliative Performance Scale: 10 %    Labs: CBC    Component Value Date/Time   WBC 5.5 11/20/2013 0330   RBC 3.50* 11/20/2013 0330   HGB 9.9* 11/20/2013 0330   HCT 31.9* 11/20/2013 0330   PLT 171 11/20/2013 0330   MCV 91.1 11/20/2013 0330   MCH 28.3 11/20/2013 0330   MCHC 31.0 11/20/2013 0330   RDW 15.0 11/20/2013 0330   LYMPHSABS 0.6* 10/31/2013 0643   MONOABS 0.7 11/14/2013 0643   EOSABS 0.0 11/24/2013 0643   BASOSABS 0.0 10/31/2013 0643    CMET     Component Value Date/Time   NA  138 11/20/2013 0330   K 4.6 11/20/2013 0330   CL 109 11/20/2013 0330   CO2 17* 11/20/2013 0330   GLUCOSE 196* 11/20/2013 0330   BUN 38* 11/20/2013 0330   CREATININE 1.19* 11/20/2013 0330   CALCIUM 8.9 11/20/2013 0330   PROT 8.3 11/18/2013 0643   ALBUMIN 3.0* 11/07/2013 0643   AST 33 11/28/2013 0643   ALT 14 11/21/2013 0643   ALKPHOS 129* 11/15/2013 0643   BILITOT 0.4 11/04/2013 0643   GFRNONAA 39* 11/20/2013 0330   GFRAA 45* 11/20/2013 0330    ASSESSMENT/ PLAN:80 yo woman actively dying of sepsis related to severe right arm cellulitis/soft tissue infection. She is hypotensive and tachycardic. She was placed on a morphine infusion yesterday-now comfortable at 2mg /hr will continue to titrate as needed.  Anticipate hospital death in next 24 hours.    Greater than 50%  of this time was spent counseling and coordinating care related to the above assessment and plan.   Edsel PetrinElizabeth L Golding, DO  11/10/2013, 6:38 AM  Please contact Palliative Medicine Team phone at [phone] for questions and concerns.

## 2013-11-29 NOTE — Progress Notes (Signed)
RN called to room by son. Pt found pulseless and without respirations. Time of death [id number]. Verified by Hulda Marinonna Alviar, RN.MD notified. AC notifed.  108mls of Morphine 1mg /ml solutions wasted in sink. Also witnessed by Hulda Marinonna Alviar, RN. Bed placement notified.

## 2013-11-29 NOTE — ED Provider Notes (Signed)
Medical screening examination/treatment/procedure(s) were conducted as a shared visit with non-physician practitioner(s) and myself.  I personally evaluated the patient during the encounter.   EKG Interpretation   Date/Time:  Sunday November 19 2013 07:28:04 EDT Ventricular Rate:  128 PR Interval:    QRS Duration: 79 QT Interval:  352 QTC Calculation: 514 R Axis:   13 Text Interpretation:  Atrial fibrillation Anterior infarct, old Noted  09-15-2013 Repolarization abnormality, prob rate related Prolonged QT  interval QTC 514 Confirmed by Fayrene FearingJAMES  MD, MARK ([id number]) on 2014/02/17 9:28:03  AM      Patient seen and examined. Care discussed with Johnnette Gourdobyn Albert PA. History reviewed. Patient has dementia and is unable to provide information. Family is uncertain about the fall. Exam shows ecchymosis and wound with erythema and warmth to the right arm. No fracture on x-ray. I agree with working diagnosis, and empiric treatment for sepsis secondary  to right upper extremity wound cellulitis.  Rolland PorterMark James, MD 11/25/2013 [phone]

## 2013-11-29 NOTE — Progress Notes (Signed)
I agrre with the above statement an witnessed waste of  Morphine 108 mls.

## 2013-11-29 NOTE — Discharge Summary (Signed)
Death Summary  Lori [name] ZOX:[id number] DOB: 1920/06/14 DOA: 11/15/2013  PCP: Thayer HeadingsMACKENZIE,BRIAN, MD PCP/Office notified: will route Death summary.   Admit date: 11/25/2013 Date of Death: 11/02/2013  Final Diagnoses:    Septic shock   Cellulitis of arm, right   HTN (hypertension)   GERD (gastroesophageal reflux disease)   Leukocytosis   Anemia   Dementia without behavioral disturbance   DM type 2, uncontrolled, with renal complications   Chronic diastolic heart failure   CKD (chronic kidney disease) stage 3, GFR 30-59 ml/min   COPD (chronic obstructive pulmonary disease)   Depression   Sepsis due to cellulitis   Atrial fibrillation with RVR   Hyperkalemia   Sepsis   Hematoma: RUE     History of present illness:  Nurs[facility] home [facility] per family wheelchair-bound, With history of underlying dementia, atrial fibrillation no longer an anticoagulation candidate secondary to history of frequent falls, chronic kidney disease stage III, gastroesophageal reflux disease, diabetes mellitus, hypertension, COPD, anemia who presents to the ED from nursing facility with right arm pain and swelling was noticed around 5 AM on the morning of admission. Patient and family deny any recent trauma. Per son right upper extremity looked okay the night prior to admission. No known fevers, no known chills, no known chest pain, no known shortness of breath, no cough, no dysuria, no abdominal pain, no nausea, no vomiting, no diarrhea, no constipation, no weakness. Per family patient has had decreased oral intake and has been wheelchair-bound over the past 2 months. Patient was noted to be oriented to person and place only which her family is her baseline.  On presentation to the emergency room patient noted to be febrile with a temp of 100.3, heart rate in the 120s to the 130s, respiratory rate in the 20s. CBC had a white count of 14.1 hemoglobin of 11.5 otherwise was within normal limits.  Comprehensive metabolic profile the sodium of 134 potassium of 5.6 BUN of 30 a creatinine of 1.16 phosphatase of 129 otherwise was within normal limits. Lactic acid was elevated at 4.91.  Plain films of the right shoulder, elbow and right humerus are negative for any acute fractures. Chest x-ray showed chronic lung changes noted again with a new right paratracheal and right upper lung zone airspace opacity which may reflect atelectasis or pneumonia. Urinalysis which was done was nitrite negative leukocytes negative. Blood cultures were drawn and are pending. Urine cultures are pending. Patient was given IV vancomycin IV Zosyn.  We were called to admit the patient for further evaluation and management.   Hospital Course:  1 septic shock/sepsis secondary to right upper extremity cellulitis and probably another source  May be secondary to right upper extremity cellulitis and probably another source. Patient has been pancultured. Chest x-ray on admission was negative for any acute infiltrate. Blood cultures no growth to date.  Urinalysis was negative. Lactic acid and pro calcitonin elevated on admission. Patient was treated with IV antibiotics, required Pressors. CCM for helping with patient care. Family didn't want aggressive intervention.  Patient was seen by palliative care and the family requesting full comfort measures. All medications have been discontinued. IV antibiotics have been discontinued. IV steroids have been discontinued. No further labs. Patient has been placed on a morphine drip for comfort. Patient died at 779 Am on 6-24.   2 right upper extremity cellulitis/hematoma  Patient was started empirically on IV vancomycin and IV Zosyn. Family request to full comfort measures. IV antibiotics  have been discontinued.  3 A. fib with RVR  Likely secondary to problem #1. Check enzymes negative x3. Magnesium was 1.2 and was replaced. Patient was started on IV digoxin per cardiology recommendations and  Cardizem drip discontinued. Heart rate in the 130s. Full comfort measures. Cardiology has signed off.  4 chronic kidney disease III  Stable.  5 COPD  Stable. No wheezing noted on examination. Comfort measures.  6 hyperkalemia  Repeat basic metabolic profile showed normal potassium.  7 anemia  Hemoglobin at 9.9 this morning from 11.5 on admission. However hemoglobin was 9.1 on 09/19/2013. Likely dilutional component. No overt bleeding.  8 gastroesophageal reflux disease  #9 hypertension  Patient was hypotensive. Cardizem drip has been discontinued. Blood pressure in the low 60-70s. Comfort care  10 leukocytosis  Likely secondary to problem #1. Full comfort measures. No further labs.  11 prophylaxis  PPI for GI prophylaxis. SCDs for DVT prophylaxis.  12 prognosis  Patient with a poor prognosis. Patient is [id number] presenting with sepsis and septic shock was requiring pressors and currently on IV steroids. Blood pressure is borderline. Patient lethargic with some confusion. Patient with dementia. Patient be tolerated yesterday and likely may not survive this hospitalization. Family requesting comfort measures. Patient has been seen by palliative care at goal his full comfort. Patient has been started on a morphine drip. No further antibiotics, no medications except for comfort, no IV fluids. Hospital death anticipated.     Time: 9;26  Signed:  Hartley BarefootRegalado, Toyna Erisman A  [facility] 11/03/2013, 5:52 PM

## 2013-11-29 DEATH — deceased

## 2014-03-24 IMAGING — CR DG ELBOW COMPLETE 3+V*R*
6 series · 6 of 6 positions shown · non-contrast
Comparison: None.

CLINICAL DATA: Shoulder and elbow pain status post fall.

RIGHT ELBOW - COMPLETE 3+ VIEW

[x elbow lat right (1 of 2)]
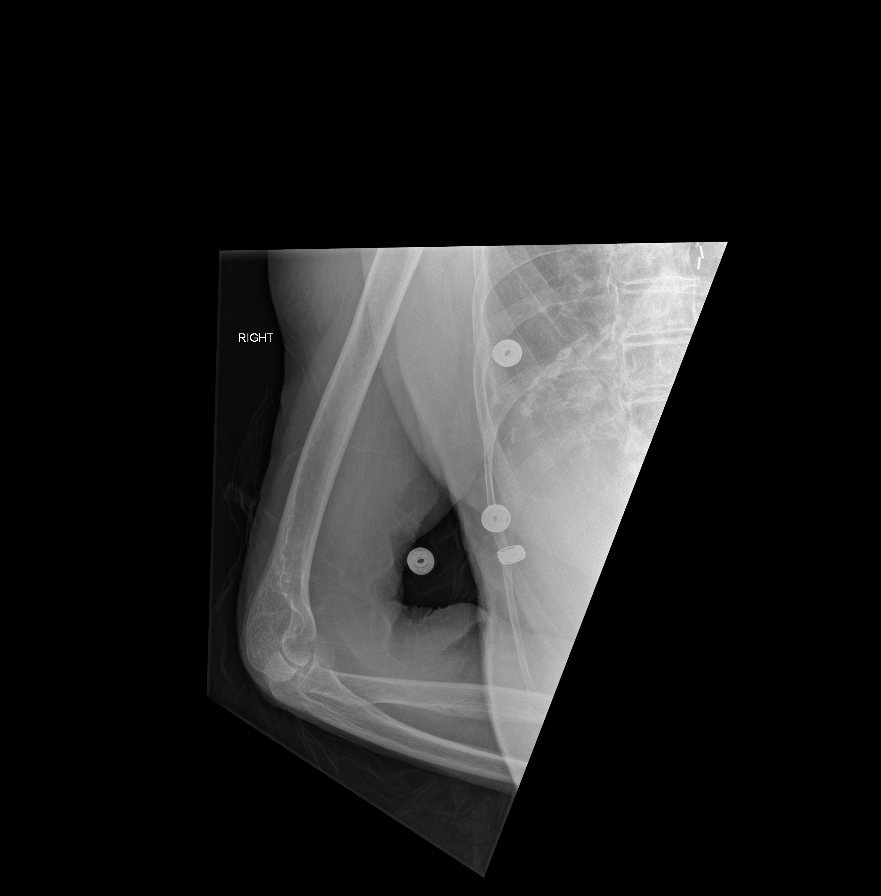

[x elbow lat right (2 of 2)]
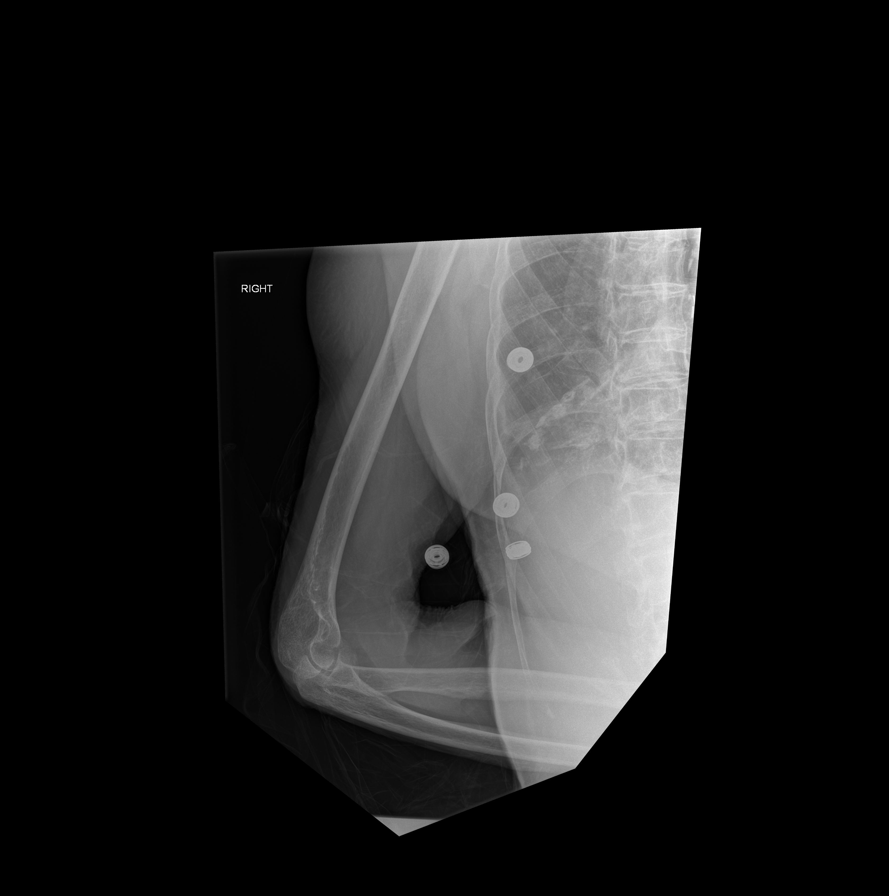

[x elbow ap right]
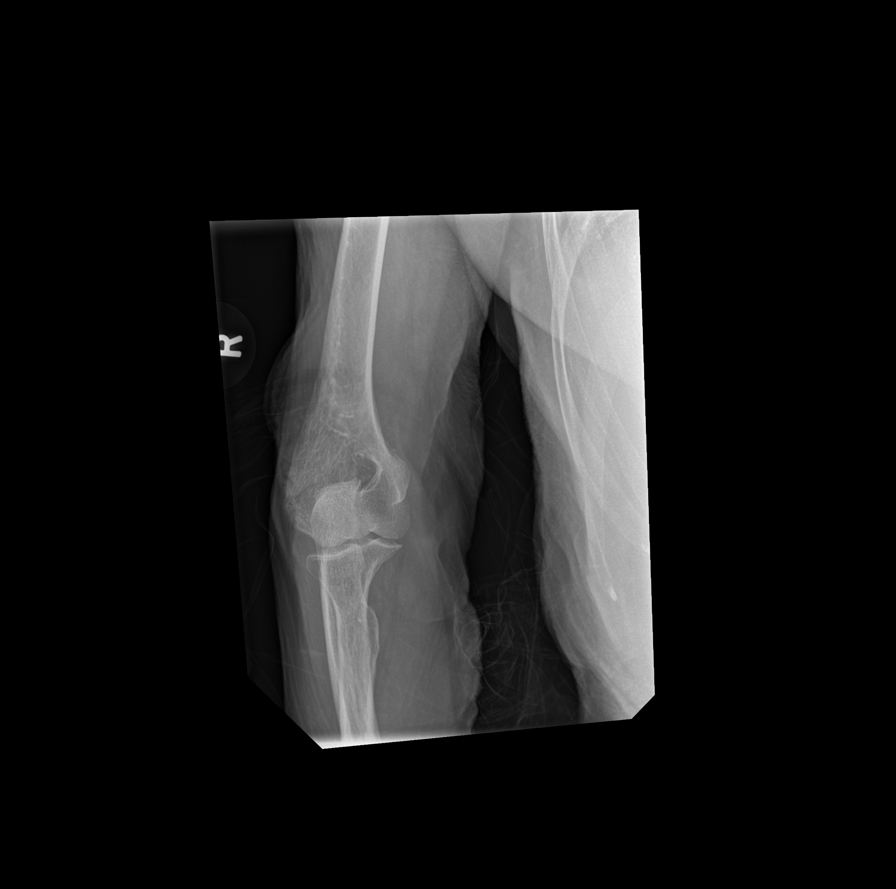

[x elbow obl right (1 of 3)]
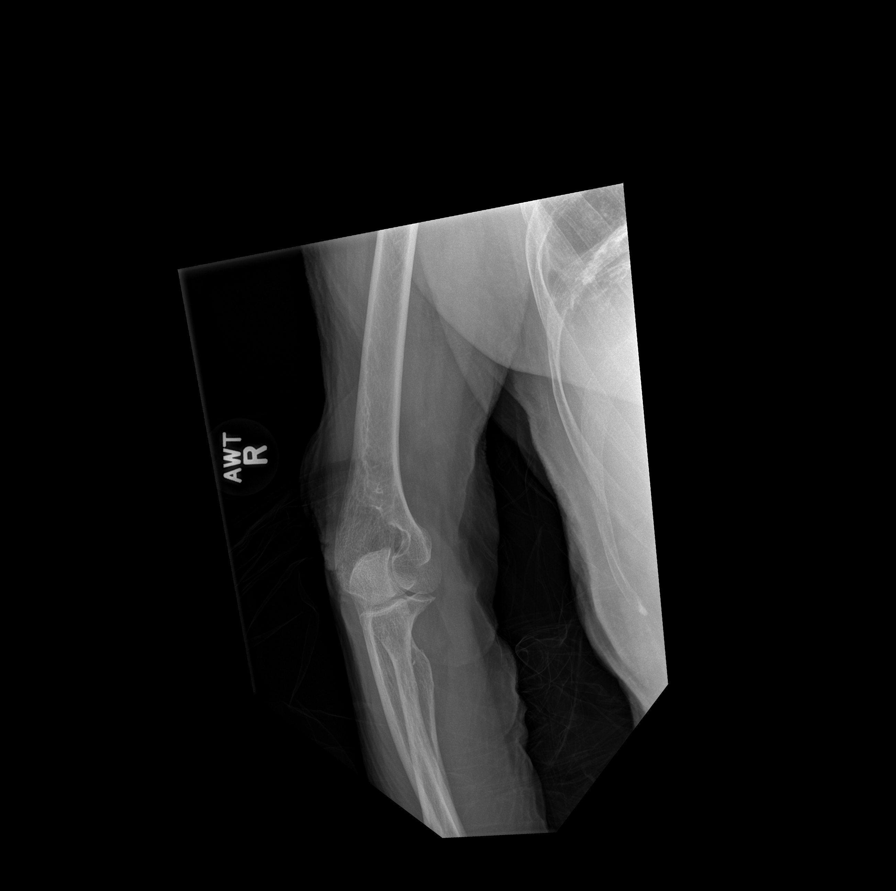

[x elbow obl right (2 of 3)]
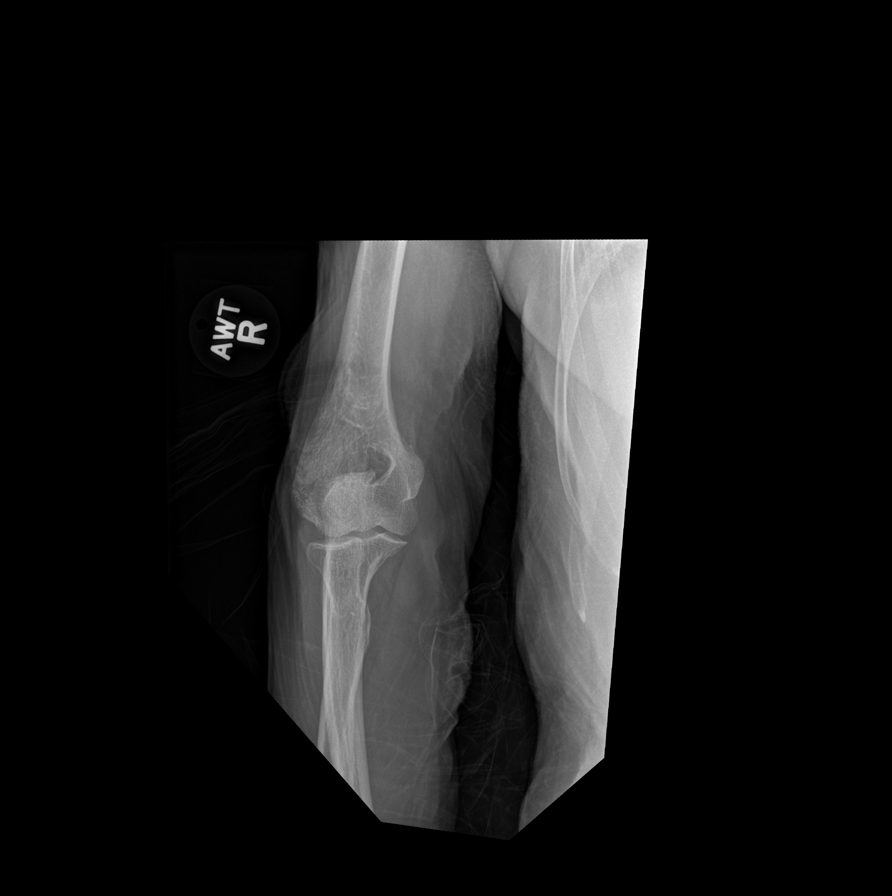

[x elbow obl right (3 of 3)]
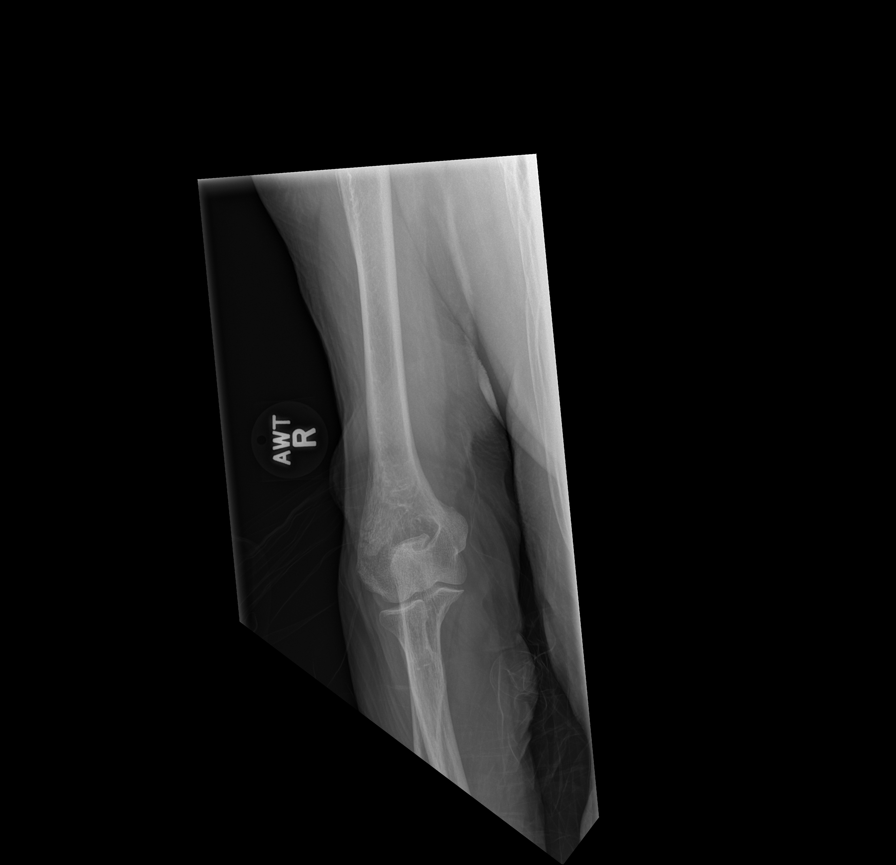

[6 of 6 positions shown; findings below may reference images not displayed]

FINDINGS: The bones are demineralized.  Skin folds overlie the
distal humerus on the AP and oblique views.  There is no evidence
of acute fracture, dislocation or large elbow joint effusion.  The
patient could not position for a true lateral view of the elbow due
to the shoulder injury.
IMPRESSION: No evidence of acute fracture or dislocation at the right elbow.

## 2016-04-01 DEATH — deceased
